# Patient Record
Sex: Male | Born: 1935 | Race: White | Hispanic: No | Marital: Married | Smoking: Former smoker
Health system: Southern US, Community
[De-identification: ages and names within clinical notes are randomized; demographics above are authoritative.]

## PROBLEM LIST (undated history)

## (undated) DIAGNOSIS — F329 Major depressive disorder, single episode, unspecified: Secondary | ICD-10-CM

## (undated) DIAGNOSIS — R351 Nocturia: Secondary | ICD-10-CM

## (undated) DIAGNOSIS — R413 Other amnesia: Secondary | ICD-10-CM

## (undated) DIAGNOSIS — C61 Malignant neoplasm of prostate: Secondary | ICD-10-CM

## (undated) DIAGNOSIS — F419 Anxiety disorder, unspecified: Secondary | ICD-10-CM

## (undated) DIAGNOSIS — R42 Dizziness and giddiness: Secondary | ICD-10-CM

## (undated) DIAGNOSIS — E785 Hyperlipidemia, unspecified: Secondary | ICD-10-CM

## (undated) DIAGNOSIS — R350 Frequency of micturition: Secondary | ICD-10-CM

## (undated) DIAGNOSIS — J984 Other disorders of lung: Secondary | ICD-10-CM

## (undated) DIAGNOSIS — F0150 Vascular dementia without behavioral disturbance: Secondary | ICD-10-CM

## (undated) DIAGNOSIS — R159 Full incontinence of feces: Secondary | ICD-10-CM

## (undated) DIAGNOSIS — Z8701 Personal history of pneumonia (recurrent): Secondary | ICD-10-CM

## (undated) DIAGNOSIS — H409 Unspecified glaucoma: Secondary | ICD-10-CM

## (undated) DIAGNOSIS — E079 Disorder of thyroid, unspecified: Secondary | ICD-10-CM

## (undated) DIAGNOSIS — M1990 Unspecified osteoarthritis, unspecified site: Secondary | ICD-10-CM

## (undated) DIAGNOSIS — S129XXA Fracture of neck, unspecified, initial encounter: Secondary | ICD-10-CM

## (undated) DIAGNOSIS — R32 Unspecified urinary incontinence: Secondary | ICD-10-CM

## (undated) DIAGNOSIS — H9190 Unspecified hearing loss, unspecified ear: Secondary | ICD-10-CM

## (undated) DIAGNOSIS — F32A Depression, unspecified: Secondary | ICD-10-CM

## (undated) DIAGNOSIS — R519 Headache, unspecified: Secondary | ICD-10-CM

## (undated) DIAGNOSIS — G629 Polyneuropathy, unspecified: Secondary | ICD-10-CM

## (undated) DIAGNOSIS — G43909 Migraine, unspecified, not intractable, without status migrainosus: Secondary | ICD-10-CM

## (undated) DIAGNOSIS — Z973 Presence of spectacles and contact lenses: Secondary | ICD-10-CM

## (undated) DIAGNOSIS — G4730 Sleep apnea, unspecified: Secondary | ICD-10-CM

## (undated) DIAGNOSIS — K219 Gastro-esophageal reflux disease without esophagitis: Secondary | ICD-10-CM

## (undated) DIAGNOSIS — I10 Essential (primary) hypertension: Secondary | ICD-10-CM

## (undated) DIAGNOSIS — I2510 Atherosclerotic heart disease of native coronary artery without angina pectoris: Secondary | ICD-10-CM

## (undated) DIAGNOSIS — R0600 Dyspnea, unspecified: Secondary | ICD-10-CM

## (undated) DIAGNOSIS — N400 Enlarged prostate without lower urinary tract symptoms: Secondary | ICD-10-CM

## (undated) HISTORY — DX: Malignant neoplasm of prostate: C61

## (undated) HISTORY — PX: CARDIAC CATHETERIZATION: SHX172

## (undated) HISTORY — PX: HIP ARTHROPLASTY: SHX981

## (undated) HISTORY — DX: Disorder of thyroid, unspecified: E07.9

## (undated) HISTORY — PX: HAND SURGERY: SHX662

## (undated) HISTORY — PX: CIRCUMCISION: SUR203

## (undated) HISTORY — PX: KNEE CARTILAGE SURGERY: SHX688

## (undated) HISTORY — DX: Vascular dementia, unspecified severity, without behavioral disturbance, psychotic disturbance, mood disturbance, and anxiety: F01.50

## (undated) HISTORY — PX: ESOPHAGOGASTRODUODENOSCOPY: SHX1529

## (undated) HISTORY — DX: Migraine, unspecified, not intractable, without status migrainosus: G43.909

## (undated) HISTORY — DX: Dizziness and giddiness: R42

## (undated) HISTORY — PX: LUMBAR LAMINECTOMY: SHX95

## (undated) HISTORY — DX: Full incontinence of feces: R15.9

## (undated) HISTORY — PX: SHOULDER SURGERY: SHX246

## (undated) HISTORY — PX: EYE SURGERY: SHX253

## (undated) HISTORY — PX: CORONARY ANGIOPLASTY: SHX604

## (undated) HISTORY — DX: Other disorders of lung: J98.4

## (undated) HISTORY — PX: COLONOSCOPY: SHX174

## (undated) HISTORY — PX: RETINAL DETACHMENT SURGERY: SHX105

---

## 2002-01-20 ENCOUNTER — Encounter: Admission: RE | Admit: 2002-01-20 | Discharge: 2002-01-20 | Payer: Self-pay | Admitting: Internal Medicine

## 2002-01-20 ENCOUNTER — Encounter: Payer: Self-pay | Admitting: Internal Medicine

## 2002-09-12 ENCOUNTER — Encounter: Payer: Self-pay | Admitting: Internal Medicine

## 2002-09-12 ENCOUNTER — Encounter: Admission: RE | Admit: 2002-09-12 | Discharge: 2002-09-12 | Payer: Self-pay | Admitting: Internal Medicine

## 2003-06-14 ENCOUNTER — Encounter: Admission: RE | Admit: 2003-06-14 | Discharge: 2003-06-14 | Payer: Self-pay | Admitting: Orthopedic Surgery

## 2003-09-06 ENCOUNTER — Encounter: Admission: RE | Admit: 2003-09-06 | Discharge: 2003-09-06 | Payer: Self-pay | Admitting: Sports Medicine

## 2003-09-18 ENCOUNTER — Inpatient Hospital Stay (HOSPITAL_COMMUNITY): Admission: RE | Admit: 2003-09-18 | Discharge: 2003-09-21 | Payer: Self-pay | Admitting: Orthopedic Surgery

## 2003-09-21 ENCOUNTER — Inpatient Hospital Stay (HOSPITAL_COMMUNITY)
Admission: RE | Admit: 2003-09-21 | Discharge: 2003-09-29 | Payer: Self-pay | Admitting: Physical Medicine & Rehabilitation

## 2004-06-30 HISTORY — PX: HIP ARTHROPLASTY: SHX981

## 2005-01-20 ENCOUNTER — Ambulatory Visit: Payer: Self-pay | Admitting: Physical Medicine & Rehabilitation

## 2005-01-20 ENCOUNTER — Inpatient Hospital Stay (HOSPITAL_COMMUNITY): Admission: RE | Admit: 2005-01-20 | Discharge: 2005-01-23 | Payer: Self-pay | Admitting: Orthopedic Surgery

## 2005-01-23 ENCOUNTER — Inpatient Hospital Stay
Admission: RE | Admit: 2005-01-23 | Discharge: 2005-01-29 | Payer: Self-pay | Admitting: Physical Medicine & Rehabilitation

## 2005-04-14 ENCOUNTER — Ambulatory Visit: Payer: Self-pay | Admitting: Physical Medicine & Rehabilitation

## 2005-04-14 ENCOUNTER — Inpatient Hospital Stay (HOSPITAL_COMMUNITY): Admission: RE | Admit: 2005-04-14 | Discharge: 2005-04-18 | Payer: Self-pay | Admitting: Orthopedic Surgery

## 2005-04-15 ENCOUNTER — Ambulatory Visit: Payer: Self-pay | Admitting: Internal Medicine

## 2005-04-18 ENCOUNTER — Inpatient Hospital Stay
Admission: RE | Admit: 2005-04-18 | Discharge: 2005-04-24 | Payer: Self-pay | Admitting: Physical Medicine & Rehabilitation

## 2007-11-09 ENCOUNTER — Encounter: Admission: RE | Admit: 2007-11-09 | Discharge: 2007-11-09 | Payer: Self-pay | Admitting: Orthopedic Surgery

## 2009-04-13 ENCOUNTER — Inpatient Hospital Stay (HOSPITAL_COMMUNITY): Admission: RE | Admit: 2009-04-13 | Discharge: 2009-04-17 | Payer: Self-pay | Admitting: Orthopedic Surgery

## 2010-07-21 ENCOUNTER — Encounter: Payer: Self-pay | Admitting: Orthopedic Surgery

## 2010-10-03 LAB — CBC
HCT: 30.8 % — ABNORMAL LOW (ref 39.0–52.0)
HCT: 31.3 % — ABNORMAL LOW (ref 39.0–52.0)
HCT: 32.9 % — ABNORMAL LOW (ref 39.0–52.0)
Hemoglobin: 10.5 g/dL — ABNORMAL LOW (ref 13.0–17.0)
Hemoglobin: 10.7 g/dL — ABNORMAL LOW (ref 13.0–17.0)
Hemoglobin: 11.1 g/dL — ABNORMAL LOW (ref 13.0–17.0)
Hemoglobin: 14.4 g/dL (ref 13.0–17.0)
MCHC: 33.9 g/dL (ref 30.0–36.0)
MCHC: 34.1 g/dL (ref 30.0–36.0)
MCHC: 34.3 g/dL (ref 30.0–36.0)
MCHC: 34.2 g/dL (ref 30.0–36.0)
MCV: 89.1 fL (ref 78.0–100.0)
MCV: 89.6 fL (ref 78.0–100.0)
MCV: 90 fL (ref 78.0–100.0)
Platelets: 121 10*3/uL — ABNORMAL LOW (ref 150–400)
Platelets: 127 10*3/uL — ABNORMAL LOW (ref 150–400)
Platelets: 132 10*3/uL — ABNORMAL LOW (ref 150–400)
RBC: 3.45 MIL/uL — ABNORMAL LOW (ref 4.22–5.81)
RBC: 3.49 MIL/uL — ABNORMAL LOW (ref 4.22–5.81)
RBC: 3.66 MIL/uL — ABNORMAL LOW (ref 4.22–5.81)
RBC: 4.71 MIL/uL (ref 4.22–5.81)
RDW: 13 % (ref 11.5–15.5)
RDW: 13.3 % (ref 11.5–15.5)
RDW: 13.6 % (ref 11.5–15.5)
WBC: 7.1 10*3/uL (ref 4.0–10.5)
WBC: 7.4 10*3/uL (ref 4.0–10.5)
WBC: 7.9 10*3/uL (ref 4.0–10.5)

## 2010-10-03 LAB — PROTIME-INR
INR: 1.18 (ref 0.00–1.49)
INR: 1.79 — ABNORMAL HIGH (ref 0.00–1.49)
INR: 2.17 — ABNORMAL HIGH (ref 0.00–1.49)
INR: 2.54 — ABNORMAL HIGH (ref 0.00–1.49)
Prothrombin Time: 14.9 seconds (ref 11.6–15.2)
Prothrombin Time: 20.6 seconds — ABNORMAL HIGH (ref 11.6–15.2)
Prothrombin Time: 24 seconds — ABNORMAL HIGH (ref 11.6–15.2)
Prothrombin Time: 27.1 seconds — ABNORMAL HIGH (ref 11.6–15.2)
Prothrombin Time: 13.2 seconds (ref 11.6–15.2)

## 2010-10-03 LAB — URINALYSIS, ROUTINE W REFLEX MICROSCOPIC
Hgb urine dipstick: NEGATIVE
Nitrite: NEGATIVE
Protein, ur: NEGATIVE mg/dL
Urobilinogen, UA: 0.2 mg/dL (ref 0.0–1.0)

## 2010-10-03 LAB — BASIC METABOLIC PANEL
BUN: 12 mg/dL (ref 6–23)
BUN: 16 mg/dL (ref 6–23)
CO2: 28 mEq/L (ref 19–32)
CO2: 28 mEq/L (ref 19–32)
Calcium: 8.8 mg/dL (ref 8.4–10.5)
Calcium: 9.1 mg/dL (ref 8.4–10.5)
Chloride: 104 mEq/L (ref 96–112)
Chloride: 105 mEq/L (ref 96–112)
Creatinine, Ser: 1.16 mg/dL (ref 0.4–1.5)
Creatinine, Ser: 1.29 mg/dL (ref 0.4–1.5)
GFR calc Af Amer: >60 mL/min (ref >60)
GFR calc Af Amer: >60 mL/min (ref >60)
GFR calc non Af Amer: 55 mL/min — ABNORMAL LOW (ref >60)
GFR calc non Af Amer: >60 mL/min (ref >60)
Glucose, Bld: 113 mg/dL — ABNORMAL HIGH (ref 70–99)
Glucose, Bld: 118 mg/dL — ABNORMAL HIGH (ref 70–99)
Potassium: 3.3 mEq/L — ABNORMAL LOW (ref 3.5–5.1)
Potassium: 3.3 mEq/L — ABNORMAL LOW (ref 3.5–5.1)
Sodium: 138 mEq/L (ref 135–145)
Sodium: 138 mEq/L (ref 135–145)

## 2010-10-03 LAB — COMPREHENSIVE METABOLIC PANEL
ALT: 18 U/L (ref 0–53)
Alkaline Phosphatase: 51 U/L (ref 39–117)
CO2: 25 mEq/L (ref 19–32)
Calcium: 10.4 mg/dL (ref 8.4–10.5)
GFR calc non Af Amer: 53 mL/min — ABNORMAL LOW (ref >60)
Glucose, Bld: 98 mg/dL (ref 70–99)
Potassium: 3.4 mEq/L — ABNORMAL LOW (ref 3.5–5.1)
Sodium: 142 mEq/L (ref 135–145)

## 2010-10-03 LAB — URINE MICROSCOPIC-ADD ON

## 2010-11-15 NOTE — Discharge Summary (Signed)
Jeffery [name], Jeffery [name]               ACCOUNT NO.:  [id number]   MEDICAL RECORD NO.:  [id number]          PATIENT TYPE:  INP   LOCATION:  1518                         FACILITY:  [facility]   PHYSICIAN:  [name], M.D.    DATE OF BIRTH:  [birth year]   DATE OF ADMISSION:  01/20/2005  DATE OF DISCHARGE:  01/23/2005                                 DISCHARGE SUMMARY   ADMISSION DIAGNOSES:  1.  Osteoarthritis, left knee.  2.  Recent history of hematuria, previous workup by urologist.  3.  Depression.  4.  Hyperlipidemia.  5.  Hypertension.  6.  Mitral valve prolapse.  7.  Benign prostatic hypertrophy.  8.  Renal cystic disease.   DISCHARGE DIAGNOSES:  1.  Osteoarthritis, left knee, status post left total knee arthroplasty.  2.  Postoperative hypokalemia, improved.  3.  Recent history of hematuria, previous workup by urologist.  4.  Depression.  5.  Hyperlipidemia.  6.  Hypertension.  7.  Mitral valve prolapse.  8.  Benign prostatic hypertrophy.  9.  Renal cystic disease.   PROCEDURE:  On January 20, 2005, left total knee.   SURGEON:  [name], M.D.   ASSISTANT:  Alexzandrew L. Perkins, P.A.-C.   ANESTHESIA:  General with postoperative Marcaine pain pump.   TOURNIQUET TIME:  Forty-seven minutes at 300 mmHg.   CONSULTS:  Rehab services.   BRIEF HISTORY:  Mr. Spiker is a 75 year old male with severe end-stage  arthritis to both knees, left more symptomatic than right, for  intraoperative management.  Now presents for total knee arthroplasty.   LABORATORY DATA:  CBC preop with hemoglobin 14 and hematocrit 40.1.  White  cell count 5.3.  Differential within normal limits.  Postop hemoglobin 11.6.  Last noted H&H 11.3 and 32.6.  PT/PTT preop 13.7 and 34, respectively.  INR  1.  Serial pro times were followed.  Last noted PT/INR 20.7 and 1.8.  Chem  panel on admission:  The potassium was on the lower side of 3.3.  The  remaining chem panel all within normal limits.  Serial BMETs  are followed:  Potassium did not drop down to 3.2.  Back up to 3.6.  The remaining  electrolytes remained within normal limits.  Urinalysis preop negative.  Blood group type A+.   Chest x-ray report sent over from [facility] dated December 26, 2004.  No definite acute process.   He had an EKG sent over dated December 25, 2004:  Normal sinus rhythm.  Confirmed.  Unable to read signature.   HOSPITAL COURSE:  Patient was admitted to [facility] , taken to  the OR, and underwent the above procedure without complication.  Patient  tolerated the procedure well.  Placed on PCM and  p.o. analgesics.  Patient  underwent a rehab consult following surgery, felt to be appropriate for  inpatient rehab versus SACU.  On day #1, the patient was doing fairly well.  Did have some pain.  Had a rough morning.  Started getting up.  Hemovac  drain was pulled on day #1.  Started therapy.  By day #2, doing a little bit better.  Still with some pain.  Patient had  been seen by rehab services.  Felt to be a good candidate.  Weaned of her  p.m. meds.  DC'd PCA and fluids.  Dressing was changed.  Incision was  healing well.  Pain pump removed.  Started getting up with therapy and  ambulating approximately 20 feet and then 35 feet later that day.   By day #3, doing a little bit better.  Continued to improve with pain  control.  Was ambulating up approximately 45 feet.  Slowly progressing.  Felt to be an excellent candidate for rehab potential.  It was noted that a  bed became available later that day.  Patient was transferred at that time.   DISCHARGE PLAN:  1.  Patient was transferred to [facility] rehab [facility] on July 27 , 2006.  2.  Discharge diagnoses:  Please see above.  3.  Discharge meds:  Continue current medications as per the [facility], which will      be sent over with the patient.  4.  Diet:  As tolerated.  Low cholesterol.  Low sodium.  5.  Follow up two weeks from surgery following  discharge from the rehab      unit.  6.  Activity:  Weightbearing as tolerated to the left lower extremity.      Continue gait training, ambulation, and ADLs as per PT/OT for total knee      protocol.  May start showering.  Daily dressing changes.   DISPOSITION:  [place] [facility].   CONDITION ON DISCHARGE:  Improved.      [name], P.A.      [name], M.D.  Electronically Signed    ALP/MEDQ  D:  03/17/2005  T:  03/17/2005  Job:  [id number]   cc:   [facility]

## 2010-11-15 NOTE — Op Note (Signed)
NAMEJONDAVID, Jeffery [name]               ACCOUNT NO.:  [id number]   MEDICAL RECORD NO.:  [id number]          PATIENT TYPE:  INP   LOCATION:  0002                         FACILITY:  [facility]   PHYSICIAN:  [name], M.D.    DATE OF BIRTH:  09/11/1935   DATE OF PROCEDURE:  04/14/2005  DATE OF DISCHARGE:                                 OPERATIVE REPORT   PREOPERATIVE DIAGNOSIS:  Osteoarthritis right knee.   POSTOPERATIVE DIAGNOSIS:  Osteoarthritis right knee.   PROCEDURE:  Right total knee arthroplasty.   SURGEON:  [name], M.D.   ASSISTANT:  Avel Peace, P.A.-C.   ANESTHESIA:  General with postop Marcaine pain pump.   ESTIMATED BLOOD LOSS:  Minimal.   DRAINS:  Hemovac x1.   TOURNIQUET TIME:  49 minutes at 300 mmHg.   COMPLICATIONS:  None.   CONDITION:  Stable to recovery.   BRIEF CLINICAL NOTE:  [name] is a 75 year old male who has end-stage  arthritis of the right knee with intractable pain. He recently had a  successful left total knee arthroplasty and presents now for right total  knee arthroplasty.   PROCEDURE IN DETAIL:  After the successful administration of general  anesthetic, a tourniquet placed high on his right thigh and right lower  extremity prepped and draped in the usual sterile fashion. Extremities  wrapped in Esmarch, knee flexed, tourniquet inflated to 300 mmHg. A standard  midline incision was made with a 10 blade through the subcutaneous tissue to  the level of the extensor mechanism. A fresh blade was used to make a medial  parapatellar arthrotomy. The soft tissue over the proximal medial tibia is  subperiosteally elevated to the joint line with a knife and into the  semimembranosus bursa with a Cobb elevator. The soft tissue over the  proximal lateral tibia also elevated with attention being paid to avoiding  the patellar tendon on tibial tubercle. The patella was everted, knee flexed  90 degrees and ACL and PCL removed. A drill was used to  create a starting  hole in the distal femur canal was irrigated. A 5 degree right valgus  alignment guide was placed and referencing off the posterior condyles  rotations marked and the block pinned to remove 11 mm off the distal femur.  Distal femoral resection was made with an oscillating saw. A sizing block  was placed and a size 6 is the most appropriate. Rotation is marked off the  epicondylar axis.   The tibia is subluxed forward and the menisci removed. Extramedullary tibial  alignment guide is placed referencing proximally at the medial aspect of the  tibial tubercle and distally along the second metatarsal axis and tibial  crest. The block is pinned to remove 10 mm off the nondeficient lateral  side. Tibial resection is made with an oscillating saw. The 10 mm spacer  block is then placed, knee held in full extension, excellent balance was  achieved. We then went back to the femur. The size 6 cutting block is placed  and then the anterior, posterior, and chamfer cuts were made. The size 6  block  is then used to make an intercondylar cut. The trial size 6 is placed  with excellent fit. The tibia is again subluxed forward and then the  proximal tibia is prepared with the modular drill and keel punch for a size  5 which is the best size. A 10 mm posterior stabilized rotating platform  insert trial is placed and then full extension is achieved with excellent  varus and valgus balance throughout full range of motion. The patella was  everted and thickness measured the 26 mm. Freehand resection is taken down  to 14 mm, 41 template is placed, lug holes are drilled, trial patella is  placed and it tracks normally. The osteophytes are then removed off the  posterior femur and all trials are subsequently removed. The cut bone  surfaces are prepared with pulsatile lavage and the cement mixed. Once ready  for implantation, the size 5 mobile bearing tibial tray, size 6 posterior  stabilized  femur and 41 patella are cemented into place and patella is held  with a clamp. A trial 10 mm insert is placed, knee held in full extension,  all extruded cement removed. Once the cement is fully hardened then the  permanent 10 mm posterior stabilized rotating platform insert is placed into  the tibial tray. The wound is copiously irrigated with saline solution and  the extensor mechanism closed over a hemovac drain with interrupted #1 PDS.  Flexion against gravity is 135 degrees. Tourniquets is released with a total  time of 49 minutes. The subcu is closed with interrupted 2-0 Vicryl,  subcuticular with running 4-0 Monocryl. The incision is cleaned and dried  and Steri-Strips and a bulky sterile dressing applied. He was then awakened  and transported to recovery in stable condition.      [name], M.D.  Electronically Signed     FA/MEDQ  D:  04/14/2005  T:  04/14/2005  Job:  [id number]

## 2010-11-15 NOTE — Consult Note (Signed)
Jeffery [name], Jeffery [name]               ACCOUNT NO.:  [id number]   MEDICAL RECORD NO.:  [id number]          PATIENT TYPE:  INP   LOCATION:  1504                         FACILITY:  [facility]   PHYSICIAN:  Arvilla Meres, M.D. LHCDATE OF BIRTH:  Jun 12, 1936   DATE OF CONSULTATION:  04/16/2005  DATE OF DISCHARGE:                                   CONSULTATION   PRIMARY CARE PHYSICIAN:  Dr. Derrell Lolling in [facility].   CHIEF COMPLAINT/REASON FOR CONSULT:  Chest pain status post knee  replacement.   HISTORY OF PRESENT ILLNESS:  Jeffery [name] is a delightful 75 year old man with  a history of hypertension and hyperlipidemia but no known coronary artery  disease, who was admitted for a right knee replacement on Monday.  He  underwent knee replacement without complications.  This morning he went to  get out of bed and was using his upper body to stabilize his weight and  noticed some chest pain on the lateral aspect of his chest.  There are no  associated features with this.  Once he sat down the chest pain subsided,  although his chest was still sore on palpation.  In talking with him he says  he has a history of somewhat chronic chest pain, especially as a young man.  He said he would feel pressure in the central part of his chest anytime he  had severe emotional stress.  He has not had any of these symptoms over the  last few years.  Although he is fairly active, he has not able to exercise  regularly due to his severe osteoarthritis and status post previous right  hip replacement, as well as left knee replacement in July 2006.  Prior to  his knee replacement in July he underwent an Adenosine Cardiolite, which  showed an EF of 48% with no evidence of ischemia.  Of note, he also  previously carried a history of mitral valve prolapse but when he moved to  [place] from [place], he underwent a TEE to further evaluate and  this showed an EF of 65% with mild mitral regurgitation but no evidence of  prolapse.   Currently he is lying in bed.  He is complaining of significant pain in his  right knee but denies any chest pain or shortness of breath.   PAST MEDICAL HISTORY:  1.  Hypertension.  2.  Hyperlipidemia.  3.  Previous history of chest pain presumed non-cardiac.      1.  Adenosine Cardiolite in July 2006 with an EF of 48% but no ischemia.  4.  Questionable history of mitral valve prolapse.      1.  TEE in July 2006 in [facility] showed an EF of 65% with mild mitral          regurgitation but no prolapse.  5.  Obesity.  6.  Gastroesophageal reflux disease.  7.  Severe osteoarthritis status post previous right hip and left knee      replacement.  Now two days postoperative from a right knee replacement.  8.  Kidney stones.  9.  Depression.  CURRENT MEDICATIONS:  1.  Potassium.  2.  Coumadin, which is new.  3.  Lisinopril 40.  4.  Plendil 10.  5.  Hydrochlorothiazide 25.  6.  Protonix 40.  7.  Hytrin 4.  8.  Avodart 0.5.  9.  Lipitor 40.   MEDICATION ALLERGIES:  NO KNOWN DRUG ALLERGIES.   REVIEW OF SYSTEMS:  He endorses some sweats as well as headaches, some mild  dyspnea on exertion, and a cough.  He also has urinary frequency and urgency  with a poor stream.  Also positive for arthralgias, joint swelling and pain,  and reflux system.  Otherwise all systems are negative except as per HPI and  problem list.   SOCIAL HISTORY:  He currently lives in [place] with his wife.  He is  retired and previously worked at [employer].  He has two children.  He previously smoked a pipe but not for very long.  He denies any alcohol  abuse.   FAMILY HISTORY:  His mother died at 41 from coronary disease.  His father  died at 25 from a brain tumor with possible coronary disease.  He does have  hypertension and cholesterol in his siblings but no premature coronary  artery disease.   PHYSICAL EXAMINATION:  GENERAL:  He is lying flat in bed in no acute  distress.  His  respirations are unlabored.  VITAL SIGNS:  Temperature 99.2, blood pressure is 120/68 with a pulse of 77.  He is saturating 91% on 2 L.  HEENT:  Sclerae anicteric.  EOMI.  There is no xanthelasma.  Mucous  membranes are moist.  NECK:  Supple.  There is no JVD.  Carotids are 2+ bilaterally without any  bruits.  There is no lymphadenopathy or thyromegaly.  CARDIAC:  He has a regular rate and rhythm with no murmurs, rubs, or  gallops.  LUNGS:  Clear to auscultation.  ABDOMEN:  Obese, soft, nontender, and nondistended.  There is no  hepatosplenomegaly, no bruits, no masses.  EXTREMITIES:  Warm.  He has a cast on the right lower extremity.  Otherwise  no cyanosis, clubbing, or edema.  Femoral pulses are 2+ bilaterally.  Distal  pulses are 2+ bilaterally.  CHEST:  Of note, there is mild reproducible chest on palpation of his  lateral left chest wall.  NEUROLOGIC:  He is alert and oriented x3.  Cranial nerves II-XII are intact.  He moves his upper arms and left leg without any difficulty.  He can move  his right leg but is limited by pain from his surgery.  His affect is very  bright.   LABORATORY DATA:  EKG shows a normal sinus rhythm at a rate of 83.  There is  borderline voltage for LVH.  There is no ST-T wave changes.   ASSESSMENT/PLAN:  Despite his multiple risk factors, Jeffery [name] chest pain  is quite atypical and certainly appears to be musculoskeletal.  I am  reassured by the fact of his negative Adenosine Cardiolite in July 2006,  although it must be noted that his ejection fraction was just on the low end  of normal at that time.  However, it was normal by transesophageal  echocardiogram.   At this time I would transfer him to telemetry and rule out myocardial  infarction with serial cardiac markers just for safety's sake.  I would also  treat him with baby aspirin.  If he rules out with three sets of negative cardiac markers, no further cardiac  workup would be needed.   We  appreciate the consult and will follow with you.      Arvilla Meres, M.D. [facility]  Electronically Signed     DB/MEDQ  D:  04/16/2005  T:  04/16/2005  Job:  [id number]   cc:   Derrell Lolling, M.D.  [place]  [place]

## 2010-11-15 NOTE — Discharge Summary (Signed)
NAMEOTT, ZIMMERLE                           ACCOUNT NO.:  [id number]   MEDICAL RECORD NO.:  [id number]                   PATIENT TYPE:  INP   LOCATION:  0478                                 FACILITY:  [facility]   PHYSICIAN:  [name], M.D.                 DATE OF BIRTH:  [birth year]   DATE OF ADMISSION:  09/18/2003  DATE OF DISCHARGE:  09/21/2003                                 DISCHARGE SUMMARY   ADMITTING DIAGNOSES:  1. Osteoarthritis of right hip.  2. Bilateral knees osteoarthritis.  3. Hypertension.  4. Hiatal hernia.  5. Reflux disease.  6. Benign prostatic hypertrophy.  7. Mitral valve prolapse.  8. Difficulty hearing, with bilateral hearing aids.  9. History of inverted T waves per EKG.   DISCHARGE DIAGNOSES:  1. Osteoarthritis of right hip, status post right total hip arthroplasty.  2. Postoperative hypokalemia, improved.  3. Bilateral knees osteoarthritis.  4. Hypertension.  5. Hiatal hernia.  6. Reflux disease.  7. Benign prostatic hypertrophy.  8  Mitral valve prolapse.  1. Difficulty hearing, with bilateral hearing aids.  2. History of inverted T waves per EKG.  3. Left eye visual acuity changes postoperative.   CONSULTS:  Rehabilitation Services.   PROCEDURE:  Date of surgery September 18, 2003 - right total hip arthroplasty.  Surgeon - Dr. Homero Fellers Aluisio.  Assistant - Avel Peace, P.A.C.  Anesthesia -  general.  Blood loss 600 cc.  Hemovac drain x1.   BRIEF HISTORY:  Mr. Jeffery [name] is a 75 year old male with severe end-stage  arthritis of the right hip.  The pain has been refractory for non-operative  management, and he now presents for a total hip arthroplasty.   LABORATORY DATA:  CBC on admission revealed hemoglobin of 14.9, hematocrit  43.5, white cell count of 5.0, red cells count 5.0, red cell count 5.09.  Differential within normal limits.  Postoperative hemoglobin and hematocrit  of 11.8 and 36.6.  The last noted hemoglobin and hematocrit of 12.0 and  34.7.  PT and PTT preoperatively 12.3 and 31 respectively, and INR of 0.9.  Serial pro times were followed per Coumadin protocol.  The last-noted PT and  INR were 18.1 and 1.8.  Chem panel on admission revealed a potassium of 3.0.  The remaining chemistry panel were all within normal limits.  Serial BMET's  were followed.  Potassium did come back up, last noted at 3.4.  The  remaining electrolytes remained within normal limits.  Urinalysis on  admission was negative.  Blood group type A positive.   EKG dated September 12, 2003 revealed normal sinus rhythm, nonspecific ST-T  changes.  No previous tracing.  Confirmed by Dr. Viann Fish, [place] Hageman.  A 2-  view chest taken on September 12, 2003 revealed mild cardiomegaly.  Patchy  bibasilar areas of subsegmental atelectasis versus scarring, or less likely  developing __________ interstitial infiltrates.  Right hip films on September 12, 2003 revealed degenerative changes in both hips, right greater than  left.  Postoperative hip films and pelvis films on September 18, 2003 revealed  normal alignment of right hip arthroplasty.   HOSPITAL COURSE:  The patient was admitted to [facility], taken to  the OR, underwent the above-stated procedure without complication.  The  patient tolerated the procedure well, later transferred to the recovery  room, and then to the orthopedic floor to continue postoperative care.  Vital signs were followed.  The patient was given 24 hours of postoperative  IV antibiotics in the form of Ancef, Coumadin for 3 weeks, and started back  on all home medications.  PT and OT were consulted postoperatively, along  with a rehabilitation consult.  Partial weightbearing of 50% to the right  lower extremity.  Hemovac drain placed pulled on postoperative day #1.  The  patient had a fairly decent night on the evening of surgery, was doing  fairly good, fluids were reduced.  The patient was seen by [facility],  and was felt to be an  appropriate candidate for inpatient rehabilitation.  Therefore, it was decided the patient could be transferred, at which time a  bed became available, and the patient was stable.  By day #2, the patient  had developed some nausea on the night before, anti-emetics as needed.  The  biggest complaint was headache.  He had tried Fioricet in the past.  Therefore, this was ordered.  He also had some irrigation in his left eye.  We tried from Visine drops due to the fact that I felt it may be irritated  from the lubricating gel that may have been used at the time of surgery.  By  day #3, the patient was having still some complaints with his left eye.  It  continued to be a problem.  The headache was a little better, utilizing the  Fioricet.  His left shoulder was a little sore from also being positioned on  the table, laying on the left side.  The biggest complaint was his left eye  though.  He had had retinal surgery on the right eye twice with bilateral  cataract surgery and bilateral lens implants.  He was quite concerned with a  previous surgery on the left eye, and a lens implant.  We put a call into  his ophthalmologist, Dr. Laruth Bouchard office, to see if the patient could be  evaluated.  It was also noted about that time that a bed actually became  available on the rehabilitation unit from a therapy standpoint.  The patient  was slowly progressing with physical therapy and felt to be an appropriate  candidate.  His hemoglobin was stable.  It was felt he would be able to go  over to rehabilitation at this time.  We contacted Dr. Laruth Bouchard office.  I  spoke with one of the ladies in his office and explained the situation to  her - him being an inpatient and being transported over to [facility].  Dr. Laruth Bouchard office was located right near [facility], and they were  asking if he could come by for a quick examination.  They said they would work him in just as soon as he rolled through the door.   We spoke to  discharge planning, and Reece Levy spoke with the EMS ambulance  services, and made arrangements for the patient to be transported over there  for a quick eye exam prior to going over to  [facility].  We wanted to  make sure that the lens implants were doing okay, and to see if he needed  any type of treatment or therapy with his eye prior to going into  rehabilitation.  Arrangements were made.  I am very appreciative of Wendee Copp work with this.  Once arrangements were made, the patient was  transferred over to [facility].   DISCHARGE PLAN:  1. The patient transferred over to [facility] rehabilitation.  2. Discharge diagnosis - please see above.   DISCHARGE MEDICATIONS:  1. The patient will continue with his pain medications of Percocet.  2. Robaxin for spasm.  3. Coumadin per pharmacy protocol.  4. He is to continue with his home medications.   DIET:  Low-sodium diet.   ACTIVITY:  Fifty percent partial weightbearing, right lower extremity.  Continue with gait training, ambulation, activities of daily living as per  [facility].   FOLLOW UP:  Follow up 2 weeks from surgery (call the office for an  appointment) or following discharge from the rehabilitation unit.   DISPOSITION:  [facility].   CONDITION ON DISCHARGE:  Improved.     [name], P.A.              [name], M.D.    ALP/MEDQ  D:  10/20/2003  T:  10/21/2003  Job:  [id number]

## 2010-11-15 NOTE — Discharge Summary (Signed)
NAMEROSALIO, CATTERTON                           ACCOUNT NO.:  [id number]   MEDICAL RECORD NO.:  [id number]                   PATIENT TYPE:  IPS   LOCATION:  4140                                 FACILITY:  [facility]   PHYSICIAN:  Ranelle Oyster, M.D.             DATE OF BIRTH:  [birth year]   DATE OF ADMISSION:  09/21/2003  DATE OF DISCHARGE:  09/29/2003                                 DISCHARGE SUMMARY   DISCHARGE DIAGNOSES:  1. Right total hip replacement.  2. History of elevated cholesterol.  3. History of benign prostatic hypertrophy.  4. History of neck pain secondary to trauma several years ago.   HISTORY OF PRESENT ILLNESS:  The patient is a 75 year old white male with  past history of hypertension, BPH, MVP and severe OA of the right hip, who  has failed conservative management and elected to undergo a right total hip  replacement on September 18, 2003 by Dr. [name].  Coumadin for DVT  prophylaxis.  PT report at this time indicates patient is partial  weightbearing of 50%, ambulating min-assist at 8 feet with rolling walker,  could transfer, min-assist, bed mobility, min-assist.  Hospital course  significant for left shoulder pain and left eye pain.  Patient went to  ophthalmologist prior to transferring to rehab; the patient stated that the  patient had fluid behind his left eye and he was treated with laser.  The  patient then transferred to [facility] Rehab Department on September 21, 2003.   PAST MEDICAL HISTORY:  1. BPH.  2. MVP.  3. Bilateral __________.  4. GERD, as above.  5. OA.  6. Head injury with right detached retina.  7. Sleep apnea.  8. Elevated cholesterol.   PAST SURGICAL HISTORY:  Past surgical history significant for:  1. Detached retina surgery x2.  2. Bilateral cataract surgery and lens implants.  3. Bilateral knee surgery.   PRIMARY CARE Zyria Fiscus:  Dr. Demetrios Isaacs. [place].   OPHTHALMOLOGIST:  Dr. Doris Cheadle. [place].   SOCIAL HISTORY:   Patient lives with wife in 1-level home.  Wife works.  Independent prior to admission.  Denies history of tobacco.  Occasional  alcohol.  He is retired.  Has 2 children local and 3 in [place].  He  retired from [employer].   FAMILY HISTORY:  Noncontributory.   REVIEW OF SYSTEMS:  Review of systems significant for neck pain, joint pain  and joint swelling.   HOSPITAL COURSE:  Mr. Letrell Attwood was admitted to [facility]  Department on September 21, 2003 for comprehensive inpatient rehabilitation  where he received more than 3 hours of therapy daily.  Overall, Mr. Vaneaton  progressed fairly well during his 8-day stay in rehab.  He was discharged at  a modified independent level.  Overall, Mr. Moure right hip healed very  well, demonstrated no signs of infection.  The patient was  followed  occasionally by Dr. [name].  He remained on Coumadin for DVT  prophylaxis without any significant bleeding complications noted.  The  patient remained partial weightbearing throughout his stay in rehab and was  able to maintain his weightbearing status as well as to adhere to the hip  precautions.  The patient maintained problems while in rehab with neck pain.  He received Kinesio tape and OxyContin and K-Pad as needed for neck pain; it  did eventually improve.  At the time of admission, the patient's left eye  was significantly red; this did steadily improved.  The patient managed to  take the drops, Alcon, as well as Travatan daily.  The patient also received  Robaxin 400 mg p.o. 4 times daily as needed for any muscle spasms.   Hospital course was also significant for decreased potassium; the patient  received potassium supplement for several days and once potassium was  normalized, it was discontinued.  The patient's blood pressure was fairly  easily controlled on lisinopril as well as hydrochlorothiazide.  He managed  to take Zocor 40 mg p.o. __________ for history of  hypercholesterolemia.  There were no other major issues that occurred while the patient was in  rehab.   Latest labs indicate his latest sodium is 140 and potassium 3.5, chloride  105, creatinine 1.1, BUN 18, glucose 107, INR 2.7; latest hemoglobin 11.9,  hematocrit 34.3, platelet count 188,000, white blood cell count 7.3.  At the  time of discharge, PT report indicated that the patient was ambulating  greater than 100 feet, modified independently at partial-weightbearing  status, able to transfer sit to stand modified independently, able to  perform most ADLs modified independently.  At the time of discharge, all  blood pressure was stable and surgical intervention demonstrated no signs of  infection.  The patient was transferred home with his wife.   DISCHARGE MEDICATIONS:  1. Coumadin 4 mg in p.m. until October 19, 2003.  2. Plendil 10 mg daily.  3. Hytrin 2 mg daily.  4. Hydrochlorothiazide 25 mg daily.  5. Lisinopril 40 mg daily.  6. Zocor 40 mg daily.  7. Travatan 0.04% 1 drop at night.  8. Alcon 1 drop 4 times a day.  9. Robaxin 1 tab every 6-8 hours as needed for spasms.  10.      Celebrex 200 mg daily.  11.      OxyContin in form of taper.  12.      Prilosec 20 mg daily.  13.      Oxycodone 5-10 mg every 4-6 hours as needed.   PAIN MANAGEMENT:  OxyContin, oxycodone and Tylenol.   ACTIVITY:  No driving.  No drinking alcohol.  Partial weightbearing 50%.  No  smoking.  He is to use walker, observe hip precautions.  [facility]  Care for PT, OT and a nurse; first draw will be Monday, October 02, 2003.   FOLLOWUP:  Follow up with Dr. Lequita Halt in 2 weeks; call for appointment.  Follow up with Dr. Ranelle Oyster as needed, [phone].  Follow up with  ophthalmologist within 1 week, Dr. Dione Booze.  Follow up with Dr. Barbee Shropshire in 4-  6 weeks.      Drucilla Schmidt, P.A.                         Ranelle Oyster, M.D.   LB/MEDQ  D:  09/29/2003  T:  09/30/2003  Job:  [id number]  cc:   [name], M.D.  [facility]  [address]  [address]  [place]  [place] [address]  Fax: [phone]   Ranelle Oyster, M.D.  [address] [place]  [place] [address]  Fax: [phone]   Olene Craven, M.D.  [address]  [address]  [place]  [place] [address]  Fax: [phone]   Doris Cheadle. Dione Booze, M.D.  [phone] [address]  [place]  [place] [address]  Fax: [phone]

## 2010-11-15 NOTE — Discharge Summary (Signed)
[name], Jeffery [name]               ACCOUNT NO.:  [id number]   MEDICAL RECORD NO.:  [id number]          PATIENT TYPE:  INP   LOCATION:  1401                         FACILITY:  [facility]   PHYSICIAN:  [name], M.D.    DATE OF BIRTH:  01/30/1936   DATE OF ADMISSION:  04/14/2005  DATE OF DISCHARGE:  04/18/2005                                 DISCHARGE SUMMARY   ADMITTING DIAGNOSES:  1.  Osteoarthritis, right knee.  2.  Arthritis.  3.  History of hematuria, recent workup.  4.  Depression.  5.  Hyperlipidemia.  6.  Hypertension.  7.  Mitral valve prolapse.  8.  Benign prostatic hypertrophy.  9.  Renal cystic disease.   DISCHARGE DIAGNOSES:  1.  Osteoarthritis - right knee, status post right total knee arthroplasty.  2.  Postoperative atypical chest pain.  Cardiac event ruled out.  3.  Arthritis.  4.  History of hematuria, recent workup.  5.  Depression.  6.  Hyperlipidemia.  7.  Hypertension.  8.  Mitral valve prolapse.  9.  Benign prostatic hypertrophy.  10. Renal cystic disease.  11. Mild postoperative blood loss anemia.  12. Mild hypokalemia, improved.   PROCEDURE:  April 14, 2005, right total knee.   SURGEON:  [name], M.D.   ASSISTANT:  [name], P.A.   ANESTHESIA:  General.   minimal blood loss.  Tourniquet time 49 minutes.   CONSULTATIONS:  1.  Rehab services.  2.  Cardiology service, Arvilla Meres, M.D. [facility]   BRIEF HISTORY:  Jeffery Jeffery [name] is a 75 year old male with end-stage osteoarthritis  of the right knee, intractable pain, recently successful undergone a left  total knee but now presents for right total knee.   LABORATORY DATA:  Pre-op CBC, hemoglobin 14.5, hematocrit 43.1, differential  within normal limits.  Post-op hemoglobin 11.2, drifted down to 11.1, last  known 10.9 and 31.5 H&H.  PT PTT pre-op 13.6 and 35 respectively.  Serial  pro times followed last known PT INR 22.1 and 1.9.  Chem panel on admission  mild low potassium at  3.4.  Remaining chem panel within normal limits.  Serial B-METs are followed.  B-METs remained within normal limits.  Cardiac  enzymes were taken, on April 16, 2005, three sets:  First set CK 59, CK-MB  0.6, troponin 0.01.  Second set CK 63, CK-MB 0.5, troponin 0.02.  Third set  CK 53, CK-MB 0.5, and troponin 0.01.  Urinalysis, pre-op, trace hemoglobin,  positive protein otherwise negative.  Blood group type A positive.  EKG:  Normal sinus rhythm, minimal voltage criteria for LVH, unconfirmed.  Followup EKG, April 17, 2005, normal sinus rhythm with normal voltage  criteria for LVH, unconfirmed.   HOSPITAL COURSE:  Admitted to [facility], taken to the OR,  tolerated the procedure well, later taken to the recovery room on the  orthopedic floor, started on PCA and p.o. analgesics, had some pain through  the night but was tolerable, did have a little bit of nausea post-op but was  relieved with antiemetics, started to get up with physical therapy, had a  tough night the second night after surgery, unfortunately his mother who  lived in [place] passed away.  Pain wise the patient has been doing a  little bit better.  Rehab consult was called.  The patient was seen in  evaluation by rehab services and felt he was borderline whether he could go  home versus inpatient rehab.  He was monitored.  He had a little bit of low  potassium after surgery and was supplemented, potassium came back up.  Unfortunately, later that day on post-op day two, he started having some  atypical chest pain.  Cardiology was consulted.  Cardiac enzymes were  ordered, series of three did not show any cardiac event.  He was transferred  to telemetry just for monitoring overnight.  He was ruled out with three  sets of negative cardiac enzymes.  By the following day of day three, he was  doing much better.  He was able to come off telemetry.  He started  progressing well with his physical therapy.  He was up  ambulating  approximately 50 feet by April 18, 2005.  He was feeling better, no chest  pain.  It was noted due to his slow progression after the atypical chest  pain, that the patient would benefit from undergoing a SACU stay.  It was  noted later that day, on April 18, 2005, that a bed became available.  He  was transferred at that time.   DISCHARGE PLAN:  The patient was transferred to [facility] on April 18, 2005.   DISCHARGE DIAGNOSES:  Please see above.   DISCHARGE MEDICATIONS:  Continue current medications as per the [facility] which  will be sent over with the patient.   DIET:  Low cholesterol, low sodium diet.   ACTIVITY:  Weightbearing as tolerated.  Continue gait training, ambulation,  ADLs for total knee protocol, daily dressing change, may start showering.   FOLLOWUP:  Two weeks from surgery or following the discharge from the [facility]  unit.   DISPOSITION:  [facility] SACU.   CONDITION ON DISCHARGE:  Improved.      [name], P.A.      [name], M.D.  Electronically Signed    ALP/MEDQ  D:  05/24/2005  T:  05/24/2005  Job:  [id number]   cc:   [name], M.D.  Fax: [phone]   Arvilla Meres, M.D. [facility]  [facility]  [address]  [place]  [place] [address]

## 2010-11-15 NOTE — H&P (Signed)
Jeffery [name], Jeffery [name]               ACCOUNT NO.:  [id number]   MEDICAL RECORD NO.:  [id number]          PATIENT TYPE:  INP   LOCATION:  NA                           [facility]ACILITY[facility]   PHYSICIAN:  [name], M.D.    DATE OF BIRTH:  May 28, 1936   DATE OF ADMISSION:  04/14/2005  DATE OF DISCHARGE:                                HISTORY & PHYSICAL   CHIEF COMPLAINT:  Right knee pain.   HISTORY OF PRESENT ILLNESS:  The patient is a 75 year old male well known to  Dr. [name] who had previously undergone a left total knee replacement  arthroplasty back in July of this year.  He had done extremely well with his  left knee.  He was known to have bilateral knee arthritis.  The left knee  was taken care of.  He has reached the point where he has done well with the  left knee and presents to have the right done.  Risks and benefits  discussed.  The patient subsequently admitted to the hospital.   ALLERGIES:  No known drug allergies.   CURRENT MEDICATIONS:  1.  Avodart daily.  2.  Celebrex 200 mg daily.  3.  Potassium chloride 10 mEq daily.  4.  Lisinopril 40 mg daily.  5.  Felodipine 10 mg extended release tablet daily.  6.  Hydrochlorothiazide 25 mg daily.  7.  Omeprazole 20 mg daily.  8.  Zocor 40 mg in the evening.  9.  Tramadol 51 four times a day p.r.n.  10. Methocarbamol 50 mg 2 tablets 4 times a day.  11. Hydrocodone/acetaminophen 5/500 one or two q.4-6 h. p.r.n. pain.  12. Terazosin 4 mg p.o. nightly.   PAST MEDICAL HISTORY:  1.  Arthritis.  2.  Past history of hematuria, recent workup.  3.  Depression.  4.  Hyperlipidemia.  5.  Hypertension.  6.  Mitral valve prolapse.  7.  Benign prostatic hypertrophy.  8.  Renal cyst.   PAST SURGICAL HISTORY:  1.  Recent left total knee arthroplasty back in July 2006.  2.  Circumcision.  3.  Cataract surgery x2 on each eye for a total of 4 surgeries.  4.  Retinal detachment of the right eye x2.  5.  Right hip surgery in the  form of right total hip replacement.  6.  Hand surgery.  7.  Left knee surgery.  8.  Right knee surgery.   SOCIAL HISTORY:  Married, two children, three step children.  No alcohol,  nonsmoker.   FAMILY HISTORY:  Father deceased at age 71 with history of heart failure,  stroke, also benign brain tumor. Mother living, age 13, with lung tumor and  with cancer, grandfather with cancer.   REVIEW OF SYSTEMS:  GENERAL:  No fever, chills, night sweats. NEUROLOGIC: No  seizure, strokes paralysis.  RESPIRATORY: No shortness of breath, productive  cough, hemoptysis.  CARDIOVASCULAR: No chest pain, angina, orthopnea. GI: No  nausea, vomiting, diarrhea, constipation. GU: No dysuria, hematuria,  discharge. MUSCULOSKELETAL: See History of Present Illness.   PHYSICAL EXAMINATION:  VITAL SIGNS:  Pulse 64, respirations  12, blood  pressure 112/78.  GENERAL:  A 75 year old white male, well-nourished, well-developed, slightly  overweight, large frame.  Alert, oriented, cooperative.  Pleasant at time of  exam.  HEENT:  Normocephalic and atraumatic.  Pupils round and reactive.  Oropharynx clear.  TMs intact.  He does have bilateral hearing aids.  NECK:  Supple, no carotid bruits appreciated.  CHEST:  Clear anterior and posterior chest wall.  No rhonchi, rales, or  wheezing.  HEART:  Regular rate and rhythm.  No murmur.  S1, S2 noted.  ABDOMEN:  Soft, round, slightly protuberant.  Bowel sounds are present,  nontender.  RECTAL/BREASTS/GENITALIA: Not done, not pertinent to present illness.  EXTREMITIES: Right knee shows range of motion about 5 to 120 degrees. There  is no instability.  Marked crepitus on passive range of motion.   IMPRESSION:  1.  Osteoarthritis right knee.  2.  Arthritis.  3.  History of hematuria, recent workup.  4.  Depression.  5.  Hyperlipidemia.  6.  Hypertension.  7.  Mitral valve prolapse.  8.  Benign prostatic hypertrophy.  9.  Renal cystic disease   PLAN:  The patient  will be admitted to [facility] to undergo a  right total knee replacement arthroplasty. Surgery will be performed by Dr.  [name].      [name], P.A.      [name], M.D.  Electronically Signed    ALP/MEDQ  D:  04/13/2005  T:  04/13/2005  Job:  [id number]   cc:   [facility]  [place], [place]

## 2010-11-15 NOTE — H&P (Signed)
NAMETEE, RICHESON               ACCOUNT NO.:  [id number]   MEDICAL RECORD NO.:  [id number]          PATIENT TYPE:  INP   LOCATION:  1518                         FACILITY:  [facility]   PHYSICIAN:  [name], M.D.    DATE OF BIRTH:  1935-11-19   DATE OF ADMISSION:  01/20/2005  DATE OF DISCHARGE:                                HISTORY & PHYSICAL   CHIEF COMPLAINT:  Left knee pain.   HISTORY OF PRESENT ILLNESS:  Patient is a 75 year old male who has been seen  by Dr. Despina Hick for ongoing bilateral knee pain.  The left knee is more  symptomatic than the right knee.  It has been ongoing for quite some time  now.  The pain is interfering with his ability to do the things that he  wants to do.  He has reached the point where he would like to have something  done about it.  He has been seen in the office and found to have end-stage  arthritis in the left knee.  It is felt that he would benefit from  undergoing surgical intervention.  Risks and benefits of the procedure have  been discussed with the patient, and he elects to proceed with surgery.   ALLERGIES:  No known drug allergies.   CURRENT MEDICATIONS:  1.  Celebrex 200 mg daily.  2.  Potassium chloride 10 mEq daily.  3.  Lisinopril 40 mg daily.  4.  Felodipine 10 mg extended release daily.  5.  Hydrochlorothiazide 25 mg daily.  6.  Omeprazole 20 mg daily.  7.  Zocor 40 mg in the evening.  8.  Tramadol 50 mg 1 four times a day p.r.n.  9.  Methocarbamol 500 mg 2 tablets 4 times a day.  10. Hydrocodone/acetaminophen 5/500 1-2 q.4-6h. as needed for pain.  11. Terazosin 4 mg q.h.s.   PAST MEDICAL HISTORY:  1.  Arthritis.  2.  Recent hematuria.  3.  Depression.  4.  Hyperlipidemia.  5.  Hypertension.  6.  Mitral valve prolapse.  7.  Benign prostatic hypertrophy.  8.  Renal cysts.   PAST SURGICAL HISTORY:  1.  Circumcision.  2.  Cataract surgery x2 on each eye for a total of four surgeries.  3.  Retinal detachment, right eye,  x2.  4.  Right hip surgery in the form of a right total hip replacement.  5.  Hand surgery.  6.  Left knee surgery.  7.  Right knee surgery.   SOCIAL HISTORY:  Married.  Two children.  Three stepchildren.  No alcohol.  Nonsmoker.   FAMILY HISTORY:  Father deceased at age 30 with history of heart failure and  stroke.  Also benign brain tumors.   REVIEW OF SYSTEMS:  GENERAL:  No fevers, chills, night sweats.  NEURO:  No  seizures, syncope, paralysis.  RESPIRATORY:  No shortness of breath,  productive cough, or hemoptysis.  CARDIOVASCULAR:  No chest pain, angina,  orthopnea.  GI:  No nausea, vomiting, diarrhea, constipation.  GU:  No  dysuria, hematuria, or discharge.  MUSCULOSKELETAL:  Left knee, from the  history  of present illness.   PHYSICAL EXAMINATION:  VITAL SIGNS:  Pulse 68, respirations 12, blood  pressure 130/78.  GENERAL:  A 75 year old white male who is well-developed, well-nourished,  large-framed, slightly overweight.  Alert, oriented, cooperative, pleasant  at the time of exam.  HEENT:  Normocephalic and atraumatic.  Pupils are round and reactive.  EOMs  are intact.  Patient is noted to wear glasses.  Does have bilateral hearing  aids.  NECK:  Supple.  No carotid bruits.  CHEST:  Clear anterior and posterior chest wall.  No rales, rhonchi or  wheezes.  HEART:  Regular rate and rhythm without murmur.  A split S1, normal S2  noted.  ABDOMEN:  Soft, round, protuberant abdomen.  Bowel sounds present.  Nontender.  RECTAL/BREASTS/GENITALIA:  Not done.  Not pertinent to the present illness.  EXTREMITIES:  The left knee shows range-of-motion 5-110.  Moderate crepitus.  No effusion.  No instability.  Right knee shows range of motion 5-120.  No  effusion.  No instability.   IMPRESSION:  1.  Osteoarthritis, left knee.  2.  Recent history of hematuria, previous workup by his urologist.  3.  Depression.  4.  Hyperlipidemia.  5.  Hypertension.  6.  Mitral valve prolapse.   7.  Benign prostatic hypertrophy.  8.  Renal cystic disease.   PLAN:  Patient admitted to [facility] to undergo a left total knee  replacement arthroplasty.  The surgery will be performed by Dr. Trudee Grip.       ALP/MEDQ  D:  01/20/2005  T:  01/20/2005  Job:  [id number]   cc:   [facility]  [address]  [place], [place]  Attn:  Dr. Ninfa Meeker [phone]

## 2010-11-15 NOTE — H&P (Signed)
NAMEMAHMOUD, BLAZEJEWSKI               ACCOUNT NO.:  [id number]   MEDICAL RECORD NO.:  [id number]          PATIENT TYPE:  ORB   LOCATION:  4527                         FACILITY:  [facility]   PHYSICIAN:  Ranelle Oyster, M.D.DATE OF BIRTH:  08/20/1935   DATE OF ADMISSION:  04/18/2005  DATE OF DISCHARGE:                                HISTORY & PHYSICAL   CHIEF COMPLAINT:  Right knee pain.   HISTORY OF PRESENT ILLNESS:  This is a 75 year old white male who has a  history of prior left total knee replacement in July of 2006, right hip  replacement last year, who presented with increasing left knee pain, failing  conservative measures.  The patient elected to undergo a right total knee  replacement performed by Dr. [name] on April 14, 2005.  The patient  was placed on Coumadin postoperatively for DVT prophylaxis.  The patient was  making progress with physical therapy, enough to the point to allow him to  be at home unattended (the patient's wife works full-time during the day).  Thus, it was decided to transfer him to subacute rehabilitation to further  improve his functional abilities.   PAST MEDICAL HISTORY:  1.  Positive for osteoarthritis.  2.  History of hematuria.  3.  Depression.  4.  Increased lipids.  5.  Hypertension.  6.  Mitral valve prolapse.  7.  BPH.  8.  Renal cysts.  9.  Left knee replacement in July 2006.  10. Circumcision.  11. __________ surgery x2 with retinal detachment, right eye, x2.  12. Prior right total hip replacement.  13. Hand surgery.  14. Prior arthroscopic knee surgeries.   REVIEW OF SYSTEMS:  Positive for right knee pain.  The patient has some mild  constipation.  Full review of systems is in the written H&P.   SOCIAL HISTORY:  The patient is married with 2 children.  He has 3 step-  children, as well.  He does not smoke or drink.   FAMILY HISTORY:  Positive for heart failure, stroke, brain tumor, lung  cancer.   FUNCTIONAL ABILITIES:   The patient was independent and active prior to  admission.   PHYSICAL EXAMINATION:  VITAL SIGNS:  Temperature is 98.2, pulse 68,  respiratory rate 20, blood pressure 138/76.  Satting 93% on room air.  The  patient's weight is 260 pounds.  GENERAL:  She is pleasant, in no acute distress.  Is alert and oriented x3.  HEENT:  Pupils equal, round and reactive to light and accommodation.  Extraocular eye movements are intact.  NECK:  Supple.  No JVD or lymphadenopathy.  HEART:  Regular rate and rhythm.  LUNGS:  Clear to auscultation bilaterally without wheezing, rales, or  rhonchi.  ABDOMEN:  Soft, nontender.  EXTREMITIES:  No clubbing or cyanosis.  Edema - trace to 1+ in right lower  extremity.  The wound is clean and intact and fairly well approximated.  NEUROLOGIC:  Intact cognition and cranial nerves exam.  Motor and function  are generally in the upper extremities, 2/5 proximally in the right lower  extremity, 3 to  3+/5 in the distal lower extremity.  Left lower extremity is  4 to 4+/5.  Sensory examination is grossly intact.   ASSESSMENT:  1.  Functional deficit secondary to osteoarthritis of the right knee, status      post right total knee replacement, postoperative day #4.  Begin subacute      level rehabilitation with modified independent goals.  Estimated length      of stay is 5-7 days.  2.  Deep vein thrombosis prophylaxis with Lovenox and Coumadin.  3.  Hypertension.  Continue Plendil, hydrochlorothiazide, lisinopril.  4.  Hypercholesterolemia.  Continue Zocor.  5.  Benign prostatic hypertrophy.  Continue Hytrin.  Monitor voiding.  Will      check postvoid residuals (PVRs) as needed.  6.  Pain management with Percocet and Robaxin.  7.  Constipation.  Begin scheduled Senokot-S.      Ranelle Oyster, M.D.  Electronically Signed     ZTS/MEDQ  D:  04/18/2005  T:  04/18/2005  Job:  [id number]

## 2010-11-15 NOTE — Discharge Summary (Signed)
Jeffery [name], Jeffery [name]               ACCOUNT NO.:  [id number]   MEDICAL RECORD NO.:  [id number]          PATIENT TYPE:  ORB   LOCATION:  4529                         FACILITY:  [facility]   PHYSICIAN:  Ranelle Oyster, M.D.DATE OF BIRTH:  1936/04/26   DATE OF ADMISSION:  01/23/2005  DATE OF DISCHARGE:  01/29/2005                                 DISCHARGE SUMMARY   DISCHARGE DIAGNOSIS:  1.  Left total knee replacement secondary to osteoarthritis.  2.  Hypertension.  3.  Depression.  4.  Acute blood loss anemia.  5.  Hypokalemia supplemented.   HISTORY OF PRESENT ILLNESS:  Jeffery [name] is a 75 year old male with a history  of mitral valve prolapse, hypertension, bilateral knee pain, left greater  than right secondary to end stage OA.  He elected to undergo left total knee  replacement July 24 by Dr. Lequita Halt.  Postop, he is weight-bearing as  tolerated and on Coumadin for DVT prophylaxis with INR at 1.8 at admission.  Currently, the patient is requiring assist for transfers, mobility, as well  as ADLs, and subacute was consulted for further therapies.   PAST MEDICAL HISTORY:  Significant for OA, BPH, recent hematuria with  negative workup, question of renal calculi, renal cysts, depression,  dyslipidemia, retinal detachment right eye x 2, excision of cataract, mild  sleep apnea, left knee arthroscopy.   ALLERGIES:  No known drug allergies.   FAMILY HISTORY:  Positive for CVA and coronary artery disease.   SOCIAL HISTORY:  The patient is married, retired, but independent,  occasionally using a cane prior to admission.  He does not use any tobacco,  uses alcohol rarely.  Wife works.   HOSPITAL COURSE:  Jeffery [name] was admitted to subacute on January 23, 2005, for SACU level therapies to consist of PT and OT daily.  Past  admission, he was maintained on Coumadin for DVT prophylaxis.  The wound was  stable and the patient was participating in therapies.  Labs done past  admission  revealed hemoglobin 10.7, hematocrit 30.5, white count 8.9,  platelets 249.  The patient was maintained on iron supplement for postop  anemia initially.  The patient had some complaints regarding constipation  and nausea, so iron supplements were discontinued.  He was noted to have  mild hypokalemia with check of electrolytes with sodium 135, potassium 3.4,  chloride 101, CO2 24, BUN 11, creatinine 1.5, glucose 105.  His K-Dur was  increased to 20 mEq daily and the patient remains on this dose at the time  of discharge.  The patient's knee has been monitored along and has been  healing well without any signs or symptoms of infection, minimal edema as  noted with ecchymosis at left shin much improved.  The patient continued on  Coumadin at the time of discharge and is to continue on this through August  24 for complete DVT prophylaxis.  The patient is discharged on 2.5 mg  Coumadin daily with pharmacy to follow and adjust Coumadin.  At the time of  discharge, the patient was at modified independent levels for ADLs and  toileting, modified independent for meal preparation, modified independent  for transfer, modified independent for ambulating 150 feet with a rolling  walker.  Further follow up therapies to include home health PT by [facility].  On January 29, 2005, the patient is discharged to home.   DISCHARGE MEDICATIONS:  Avalide 0.5 mg q.h.s., Plendil 10 mg daily, Prinivil  40 mg daily, HCTZ 25 mg daily, K-Dur 20 mEq daily, Hytrin 4 mg q.h.s.,  Coumadin 5 mg 1/2 p.o. q.p.m., Robaxin 500 mg 1-2 q.i.d. p.r.n. spasms, Oxy-  IR 5-10 mg q.4-6h. p.r.n. pain.   DISCHARGE INSTRUCTIONS:  Activities:  As tolerated with the use of a walker.  Diet is regular.  Wound care:  Wash area with soap and water, keep clean and  dry.  The patient is to follow up with Dr. Lequita Halt for postop check.  Follow  up with Dr. Riley Kill as needed.      Greg Cutter, P.A.      Ranelle Oyster, M.D.   Electronically Signed    PP/MEDQ  D:  04/11/2005  T:  04/11/2005  Job:  [id number]   cc:   [name], M.D.  Fax: [phone]

## 2010-11-15 NOTE — Op Note (Signed)
NAMEBEATRIZ, SETTLES                           ACCOUNT NO.:  [id number]   MEDICAL RECORD NO.:  [id number]                   PATIENT TYPE:  INP   LOCATION:  X005                                 [facility]ACILITY:  [facility]   PHYSICIAN:  [name], M.D.                 DATE OF BIRTH:  [birth year]   DATE OF PROCEDURE:  09/18/2003  DATE OF DISCHARGE:                                 OPERATIVE REPORT   PREOPERATIVE DIAGNOSIS:  Osteoarthritis, right hip.   POSTOPERATIVE DIAGNOSIS:  Osteoarthritis, right hip.   PROCEDURE:  Right total hip arthroplasty.   SURGEON:  [name], M.D.   ASSISTANT:  [name], P.A.   ANESTHESIA:  General.   ESTIMATED BLOOD LOSS:  600.   DRAINS:  Hemovac x1.   COMPLICATIONS:  None.   CONDITION:  Stable to recovery.   CLINICAL NOTE:  Mr. Abdalla is a 75 year old male with severe end-stage  osteoarthritis of the right hip with pain refractory to nonoperative  management.  He presents now for right total hip arthroplasty.   PROCEDURE IN DETAIL:  After successful administration of general anesthesia,  the patient was placed in the left lateral decubitus position with the right  side up and held with the hip positioner.  The right lower extremity was  isolated from his perineum with plastic drapes and prepped and draped in the  usual sterile fashion.  Standard posterolateral incision was made with a 10  blade through the subcutaneous tissue to the level of the fascia lata, which  was incised in line with the skin incision.  The sciatic nerve was palpated  and protected, and short rotators isolated off the femur.  The capsulectomy  was performed, and the hip dislocated.  The center of the femoral head is  marked, and a trial prosthesis placed such that the center of the trial head  corresponds to the center of his native femoral head.  The osteotomy line is  marked on the femoral neck, and an osteotomy is made with an oscillating  saw.  The femur is  then retracted anteriorly to gain acetabular exposure.  The acetabular retractors were placed, and labrum and osteophytes removed.  Reaming starts at 47, progressed in increments of 2 to 57, and a 58 mm  Pinnacle acetabular shell is placed in an anatomic position and transfixed  with two dome screws.  Trial 36 mm neutral liner is placed.   The femur is prepared through a canal finder, then irrigation.  Axial  reaming was performed up to 15.5 mm and a proximal reaming through a 14F in  the sleeve machine to an extra, extra large.  A 14F extra, extra large trial  sleeve was placed with a 20 x 15 stem and a 36+8 neck.  We went about 20  degrees beyond his native version, which was neutral.  Placed a 36+0 head  and reduced the hip.  There was a large anterior osteophyte that was  impinging, so had to remove that.  Even with that out, I felt there was not  enough offset, so went to a 36+12 neck, which improved the offset  considerably and led to much better soft tissue clearance.  We had excellent  stability with full extension, full external rotation with 70 degrees  flexion, 40 degrees adduction, and about 70 degrees internal rotation, and  90 degrees flexion, 70 degrees internal rotation.  By placing the left leg  on top of the right, it was a couple of millimeters short.  I went to a 36+6  head, which corrected that and left a much better soft tissue tension.  I  was also able to get more internal rotation without impingement.  The hip  was then dislocated, and all trials were removed.  The permanent apex hole  eliminator is placed into the acetabular shell, then a 36 mm neutral  Ultramet metal liner is placed.  It is a metal-on-metal hip replacement.  The 69F extra, extra large sleeve is placed with a 20x15 stem and a 36+12  neck, about 20 degrees beyond his native anteversion.  Once the stem was  fully impacted, then the 36+6 head is placed, and the hip is reduced with  the same stability  parameters.  The wound is copiously irrigated with  antibiotic solution, and the short rotators were reattached to the femur  through drill holes.  The fascia lata is closed over a Hemovac drain with #1  Vicryl.  Subcu was closed with #1 and 2-0 Vicryl, and subcuticular running 4-  0 Monocryl.  Marcaine 0.25% 30 cc was injected into the subcu tissues.  Both  of the Steri-Strips and bulky sterile dressings applied.  The drain was  hooked to suction and placed into a knee immobilizer, awakened and  transported to recovery in stable condition.                                               [name], M.D.    FA/MEDQ  D:  09/18/2003  T:  09/19/2003  Job:  [id number]

## 2010-11-15 NOTE — Op Note (Signed)
NAMEDEMARRIO, Jeffery [name]               ACCOUNT NO.:  [id number]   MEDICAL RECORD NO.:  [id number]          PATIENT TYPE:  INP   LOCATION:  0006                         FACILITY:  [facility]   PHYSICIAN:  [name], M.D.    DATE OF BIRTH:  1936-01-23   DATE OF PROCEDURE:  01/20/2005  DATE OF DISCHARGE:                                 OPERATIVE REPORT   PREOPERATIVE DIAGNOSIS:  Osteoarthritis left knee.   POSTOPERATIVE DIAGNOSIS:  Osteoarthritis left knee.   PROCEDURE:  Left total knee arthroplasty.   SURGEON:  Jeffery [name]   ASSISTANT:  Jeffery Peace, PA-C   ANESTHESIA:  General with postop Marcaine pain pump.   ESTIMATED BLOOD LOSS:  Minimal.   DRAIN:  Hemovac x 1.   TOURNIQUET TIME:  47 minutes at 300 mmHg.   COMPLICATIONS:  None.   CONDITION:  Stable to recovery.   CLINICAL NOTE:  Jeffery [name] is a 75 year old male with severe end-stage  arthritis of both knees, left more symptomatic than right.  He has failed  nonoperative management including injections and presents now for left total  knee arthroplasty.   PROCEDURE IN DETAIL:  After the successful initiation of general anesthetic,  a tourniquet is placed on the left thigh and left lower extremity prepped  and draped in the usual sterile fashion.  Extremity is wrapped in Esmarch,  knee flexed, tourniquet inflated 300 mmHg.  Standard midline incision is  made with a 10 blade through subcutaneous tissue to the level of the  extensor mechanism.  A fresh blade is used to make a medial parapatellar  arthrotomy, and the soft tissue over the proximal and medial tibia is  subperiosteally elevated to the joint line with the knife and into the  semimembranosus bursa with a Cobb elevator.  Soft tissue over the proximal  lateral tibia is also elevated with attention being paid to avoiding the  patellar tendon on tibial tubercle.  The patella is everted, knee flexed 90  degrees, ACL and PCL removed.  Drill was used to create a starting  hole, and  the distal femur canal is irrigated. A 5-degree left valgus alignment guide  is placed and referencing off the posterior condyles, rotation is marked and  the block pinned to remove 10 mm off the distal femur.  Distal femoral  resection is made with an oscillating saw.  Sizing block is placed, and a  size 6 is most appropriate for the femoral component.  The rotation is  marked off the epicondylar axis.  Size 6 cutting block is placed, and the  anterior, posterior, and chamfer cuts are made.   Tibia is subluxed forward and the menisci removed.  Extramedullary tibial  alignment guides is placed, referencing proximally at the medial aspect of  the tibial tubercle and distally along the second metatarsal axis and tibial  crest.  The block is pinned to remove 10 mm off the nondeficient lateral  side.  Tibial resection is made with an oscillating saw.  Size 5 is the most  appropriate tibial component, and then the proximal tibia is prepared with  the  modular drill and keel punch for the size 5.  Femoral preparation is  completed with the intercondylar cut for the size 6.   The size 5 mobile bearing tibial trial and size 6 posterior stabilized  femoral trial and a 10 mm posterior stabilized rotating platform insert  trial are placed.  With the 10, full extension is achieved with excellent  varus and valgus balance throughout full range of motion.  Patella was  everted and thickness measured to be 25 mm.  Freehand resection is taken  down to 14 mm, 41 template is placed, lug holes were drilled, trial patella  was placed and it tracks normally.  The osteophytes are then removed off the  posterior femur with the trial in place.  All trials are removed, and the  cut bone surfaces are prepared with pulsatile lavage.  Cement is mixed and  once ready for implantation, the size 5 mobile bearing tibial tray, size 6  posterior stabilized femur, and 41 patella are cemented into place.  The   patella is held with the clamp.  The 10 mm trial inserts placed, knee held  in full extension, and all extruded cement removed.  Once the cement is  fully hardened, then the permanent 10 mm posterior stabilized rotating  platform insert is placed into the tibial tray.  The wound is copiously  irrigated with saline solution and the extensor mechanism closed over a  Hemovac drain with interrupted #1 Vicryl.  Tourniquet is released for a  total time of 47 minutes.  Flexion against gravity is 135 degrees.  The  subcu is closed with interrupted 2-0 Vicryl, subcuticular running 4-0  Monocryl.  The catheter for the Marcaine pain pump is placed, and the is  initiated.  Hemovac drain is hooked to suction.  Steri-Strips and a bulky  sterile dressing are applied.  He is placed into a knee immobilizer,  awakened, and transported to recovery in stable condition.       FA/MEDQ  D:  01/20/2005  T:  01/20/2005  Job:  [id number]

## 2010-11-15 NOTE — Discharge Summary (Signed)
NAMELYNDALL, WINDT               ACCOUNT NO.:  [id number]   MEDICAL RECORD NO.:  [id number]          PATIENT TYPE:  ORB   LOCATION:  4529                         FACILITY:  [facility]   PHYSICIAN:  Ranelle Oyster, M.D.DATE OF BIRTH:  1935-09-12   DATE OF ADMISSION:  04/18/2005  DATE OF DISCHARGE:  04/24/2005                                 DISCHARGE SUMMARY   DISCHARGE DIAGNOSES:  1.  Right knee osteoarthritis requiring right total knee replacement.  2.  Hypertension.  3.  Postoperative anemia.  4.  Benign prostatic hypertrophy.   HISTORY OF PRESENT ILLNESS:  Mr. Muzyka is a 75 year old male with history  of hypertension, OA bilateral knees, left total knee replacement in  September 2006, and now with right knee pain with failure of conservative  therapy.  He elected to undergo right total knee replacement on April 14, 2005, by Dr. Lequita Halt.  Postoperatively, he is weightbearing as tolerated, on  Coumadin for DVT prophylaxis.  Postoperative course was complicated by  episode of chest pain April 14, 2005, requiring transfer to telemetry by  Dr. Gala Romney.  Serial enzymes were checked and were noted to be negative.  Cardiology feels patient's chest pain is noncardiac in nature and probably  secondary to musculoskeletal anatomy.  Currently, patient is requiring  assist with transfers and ambulations and subacute was consulted for further  therapies.   PAST MEDICAL HISTORY:  1.  Retinal detachment x2.  2.  Depression.  3.  Right total hip replacement, no fall.  4.  Left total knee replacement July 2006.  5.  Lumbar spondylosis.  6.  Benign prostatic hypertrophy.  7.  Mild sleep apnea.  8.  Dyslipidemia.  9.  Hypertension.  10. Obesity.  11. Noncardiac chest pain and hematuria.   ALLERGIES:  No known drug allergies.   FAMILY HISTORY:  Positive for CVA, lung cancer and coronary artery disease.   SOCIAL HISTORY:  Patient is married.  Was independent and driving prior to  admission.  He does not use any tobacco.  Uses alcohol rarely.   HOSPITAL COURSE:  Mr. Izaya Netherton was admitted subacute on April 18, 2005, for SACU level therapies to consist of PT and OT daily.  Past  admission, patient was continued on Coumadin for DVT prophylaxis.  Blood  pressures were monitored on Plendil, hydrochlorothiazide  and Zestril on  b.i.d. basis.  __________ good control and went from 120s to 130 systolic,  60s to 80s diastolic.  Patient has been afebrile during his stay.  Has been  continent of bowel and bladder without any problems with urinary retention.   Labs done past admission reveal hemoglobin 10.8, hematocrit 31.8, white  count 6.9, platelets 257.  Sodium 141, potassium 3.2, chloride 107, CO2 28,  BUN 21, creatinine 1.3, glucose 100.  Secondary to hypokalemia, his  potassium dose was increased.  Recheck labs on April 23, 2005, shows  resolution of hypokalemia with potassium at 4.0.   During his stay in subacute, Mr. Campi made good progress.  By the time of  discharge, he was at modified independent level for  ADLs, modified  independent for toileting, modified independent for advanced ADLs, he was  modified independent for bed mobility and transfer, modified independent for  ambulating greater than 200 feet with rolling walker, required supervision  to navigate one step with rolling walker.  Further follow-up therapies to  include home health PT and OT by [facility].  Patient to continue on  Coumadin through May 15, 2005, with home health RN to check protime  April 25, 2005.  On April 24, 2005, the patient is discharged to home.   DISCHARGE MEDICATIONS:  1.  Avodart 0.5 mg nightly.  2.  Plendil 10 mg a day.  3.  Hydrochlorothiazide 25 mg a day.  4.  Prinivil 40 mg a day.  5.  Prilosec one per day.  6.  Hytrin 4 mg per day.  7.  Senna S2 p.o. nightly.  8.  Lipitor 20 mg per day.  9.  Celebrex 200 mg per day.  10. K-Dur 20 mEq a  day.  11. Lidocaine patch to right ankle on 8 a.m., off 8 p.m.  12. Oxy IR 5 to 10 mg q.4-6h. p.r.n. pain.  13. Robaxin 500 mg q.i.d. p.r.n.  14. Coumadin 10 mg p.o. q.p.m.   ACTIVITY:  As tolerated with use of walker.   DIET:  Regular.   WOUND CARE:  Wash area with soap and water, keep clean and dry.   SPECIAL INSTRUCTIONS:  Home health RN to check protime April 15, 2005,  with [facility] to provide PT, OT and RN.  Also [facility]  to provide  home CPM for range of motion.   FOLLOW UP:  Patient to follow up with Dr. Lequita Halt for appointment in one  week, to follow up with Dr. Riley Kill as needed.      Greg Cutter, P.A.      Ranelle Oyster, M.D.  Electronically Signed    PP/MEDQ  D:  04/29/2005  T:  04/30/2005  Job:  [id number]   cc:   [name], M.D.  Fax: [phone]   Dr. Derrell Lolling

## 2010-11-15 NOTE — H&P (Signed)
Jeffery [name], Jeffery [name]                           ACCOUNT NO.:  [id number]   MEDICAL RECORD NO.:  [id number]                   PATIENT TYPE:  LINP   LOCATION:                                       FACILITY:  [facility]   PHYSICIAN:  [name], M.D.                 DATE OF BIRTH:  08-09-1935   DATE OF ADMISSION:  09/18/2003  DATE OF DISCHARGE:                                HISTORY & PHYSICAL   DATE OF OFFICE VISIT AND HISTORY AND PHYSICAL:  September 07, 2003.   CHIEF COMPLAINT:  Right hip pain.   HISTORY OF PRESENT ILLNESS:  The patient is a 75 year old male, who has been  by Dr. Lequita Halt for ongoing right hip pain.  He was initially worked up by  Dr. Francena Hanly for an arthritic hip.  The pain has been progressively  getting worse over time.  It is to the point where it is interfering with  his daily activities.  X-rays in the office showed end-stage arthritis in  the right hip with bone-on-bone changes but no evidence of any significant  bone loss.  Due to his symptoms, he was felt to be a candidate for right hip  replacement.  Risks and benefits discussed.  The patient was evaluated by  Dr. Lequita Halt and elected to proceed with surgery.   ALLERGIES:  No known drug allergies.   CURRENT MEDICATIONS:  1. Ultracet p.r.n.  2. Vicodin p.r.n.  3. Lisinopril 40 mg.  4. Felodipine 10 mg.  5. Hydrochlorothiazide 25 mg.  6. Omeprazole 20 mg.  7. Celebrex 200 mg.  8. Sandostatin 40 mg.  9. Terazosin 2 mg.  10.      Tylenol p.r.n.   PAST MEDICAL HISTORY:  1. Bilateral knees osteoarthritis.  2. Right hip osteoarthritis.  3. Hypertension.  4. Hiatal hernia.  5. Reflux disease.  6. Benign prostatic hypertrophy.  7. Mitral valve prolapse.  8. Difficulty hearing with hearing loss, utilizes bilateral hearing aids.  9. 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  This has been worked up with previous hospitalizations.     All work-ups have been  negative.   PAST SURGICAL HISTORY:  1. Detached retinal surgery in the right eye x 2.  2. Cataract surgery bilaterally.  3. Colonoscopy.  4. Lens implants bilaterally.  5. Bilateral knee surgeries.   FAMILY HISTORY:  Mother living, age 46 with arthritis and history of valve  replacement.  She also has a hiatal hernia and arterial disease with  coronary stents.  Father deceased age 94 with a history of brain tumor and  stroke.   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  He has  two daughters, three other children through a second marriage.  No tobacco  products with the exception of  an occasional cigar, occasional seldom social  intake of alcohol.   REVIEW OF SYSTEMS:  GENERAL:  No fevers, chills, or night sweats.  NEUROLOGIC:  No seizures, syncope, paralysis.  RESPIRATORY:  No shortness of  breath, productive cough, or hemoptysis.  CARDIOVASCULAR:  The history of  inverted T-waves on EKG.  No chest pain, angina, or orthopnea.  GI:  No  nausea, vomiting, diarrhea, or constipation.  GU:  No dysuria, hematuria, or  discharge.  MUSCULOSKELETAL:  Pertinent to that of the history of present  illness.   PHYSICAL EXAMINATION:  VITAL SIGNS:  Pulse 68, respirations 14, blood  pressure 148/88.  GENERAL:  A 75 year old white male, well-nourished, well-developed, large  frame; alert, oriented, and cooperative.  Appears to ge a good historian.  HEENT:  Normocephalic, atraumatic.  Pupils are round and reactive.  Oropharynx is clear.  EOMs are intact.  NECK:  Supple.  No carotid bruits are appreciated.  CHEST:  Clear anterior and posterior chest walls.  No rhonchi, rales, or  wheezing.  HEART:  Regular rate and rhythm, no murmurs.  ABDOMEN:  Soft, slightly round, protuberant abdomen.  Bowel sounds are  present.  RECTAL/BREASTS/GENITALIA:  Not done.  Not pertinent to present illness.  EXTREMITIES:  Right hip shows flexion of 95 degrees.  There is only 10  degrees of  internal rotation, 20 degrees of external rotation, about 30  degrees of abduction.  Motor function is intact.   IMPRESSION:  1. Osteoarthritis, right hip.  2. Bilateral knee osteoarthritis.  3. Hypertension.  4. Hiatal hernia.  5. Reflux disease.  6. Benign prostatic hypertrophy.  7. Mitral valve prolapse.  8. Difficulty hearing with bilateral hearing aids.  9. History of inverted T-waves per EKG.   PLAN:  The patient will be admitted to [facility] to undergo right  total hip arthroplasty.  Surgery will be performed by Dr. [name].     [name], P.A.              [name], M.D.    ALP/MEDQ  D:  09/23/2003  T:  09/23/2003  Job:  [id number]

## 2011-01-15 ENCOUNTER — Other Ambulatory Visit: Payer: Self-pay | Admitting: Sports Medicine

## 2011-01-15 ENCOUNTER — Ambulatory Visit
Admission: RE | Admit: 2011-01-15 | Discharge: 2011-01-15 | Disposition: A | Discharge status: Home / Self Care | Payer: Self-pay | Source: Ambulatory Visit | Attending: Sports Medicine | Admitting: Sports Medicine

## 2011-01-15 DIAGNOSIS — M25512 Pain in left shoulder: Secondary | ICD-10-CM

## 2011-06-18 ENCOUNTER — Other Ambulatory Visit: Payer: Self-pay | Admitting: Sports Medicine

## 2011-06-18 DIAGNOSIS — M25512 Pain in left shoulder: Secondary | ICD-10-CM

## 2011-06-20 ENCOUNTER — Ambulatory Visit
Admission: RE | Admit: 2011-06-20 | Discharge: 2011-06-20 | Disposition: A | Discharge status: Home / Self Care | Payer: Medicare Other | Source: Ambulatory Visit | Attending: Sports Medicine | Admitting: Sports Medicine

## 2011-06-20 DIAGNOSIS — M25512 Pain in left shoulder: Secondary | ICD-10-CM

## 2011-08-05 ENCOUNTER — Ambulatory Visit: Payer: Medicare Other | Admitting: Physical Therapy

## 2011-08-19 ENCOUNTER — Ambulatory Visit: Payer: Medicare Other | Attending: Orthopedic Surgery | Admitting: Physical Therapy

## 2011-08-19 DIAGNOSIS — M25519 Pain in unspecified shoulder: Secondary | ICD-10-CM | POA: Insufficient documentation

## 2011-08-19 DIAGNOSIS — M25619 Stiffness of unspecified shoulder, not elsewhere classified: Secondary | ICD-10-CM | POA: Insufficient documentation

## 2011-08-19 DIAGNOSIS — M6281 Muscle weakness (generalized): Secondary | ICD-10-CM | POA: Insufficient documentation

## 2011-08-19 DIAGNOSIS — IMO0001 Reserved for inherently not codable concepts without codable children: Secondary | ICD-10-CM | POA: Insufficient documentation

## 2011-08-22 ENCOUNTER — Ambulatory Visit: Payer: Medicare Other | Admitting: Physical Therapy

## 2011-08-25 ENCOUNTER — Encounter: Payer: Self-pay | Admitting: Physical Therapy

## 2011-08-27 ENCOUNTER — Ambulatory Visit: Payer: Medicare Other | Admitting: Physical Therapy

## 2011-08-28 ENCOUNTER — Ambulatory Visit: Payer: Medicare Other | Admitting: Physical Therapy

## 2011-08-29 ENCOUNTER — Encounter: Payer: Self-pay | Admitting: Physical Therapy

## 2011-09-05 ENCOUNTER — Encounter: Payer: Self-pay | Admitting: Physical Therapy

## 2011-09-12 ENCOUNTER — Ambulatory Visit: Payer: Medicare Other | Attending: Orthopedic Surgery | Admitting: Physical Therapy

## 2011-09-12 DIAGNOSIS — IMO0001 Reserved for inherently not codable concepts without codable children: Secondary | ICD-10-CM | POA: Insufficient documentation

## 2011-09-12 DIAGNOSIS — M25519 Pain in unspecified shoulder: Secondary | ICD-10-CM | POA: Insufficient documentation

## 2011-09-12 DIAGNOSIS — M25619 Stiffness of unspecified shoulder, not elsewhere classified: Secondary | ICD-10-CM | POA: Insufficient documentation

## 2011-09-12 DIAGNOSIS — M6281 Muscle weakness (generalized): Secondary | ICD-10-CM | POA: Insufficient documentation

## 2011-09-19 ENCOUNTER — Encounter: Payer: Self-pay | Admitting: Physical Therapy

## 2011-09-23 ENCOUNTER — Ambulatory Visit: Payer: Medicare Other | Admitting: Physical Therapy

## 2014-10-20 ENCOUNTER — Other Ambulatory Visit: Payer: Self-pay | Admitting: Orthopaedic Surgery

## 2014-10-20 DIAGNOSIS — M419 Scoliosis, unspecified: Secondary | ICD-10-CM

## 2014-11-14 ENCOUNTER — Ambulatory Visit
Admission: RE | Admit: 2014-11-14 | Discharge: 2014-11-14 | Disposition: A | Discharge status: Home / Self Care | Payer: Medicare HMO | Source: Ambulatory Visit | Attending: Orthopaedic Surgery | Admitting: Orthopaedic Surgery

## 2014-11-14 DIAGNOSIS — M419 Scoliosis, unspecified: Secondary | ICD-10-CM

## 2016-05-26 ENCOUNTER — Other Ambulatory Visit (HOSPITAL_COMMUNITY): Payer: Self-pay

## 2016-05-26 ENCOUNTER — Encounter (HOSPITAL_COMMUNITY)
Admission: RE | Admit: 2016-05-26 | Discharge: 2016-05-26 | Disposition: A | Discharge status: Home / Self Care | Payer: Medicare Other | Source: Ambulatory Visit | Attending: Orthopedic Surgery | Admitting: Orthopedic Surgery

## 2016-05-26 ENCOUNTER — Encounter (HOSPITAL_COMMUNITY): Payer: Self-pay

## 2016-05-26 DIAGNOSIS — I10 Essential (primary) hypertension: Secondary | ICD-10-CM

## 2016-05-26 DIAGNOSIS — Z01818 Encounter for other preprocedural examination: Secondary | ICD-10-CM

## 2016-05-26 HISTORY — DX: Anxiety disorder, unspecified: F41.9

## 2016-05-26 HISTORY — DX: Presence of spectacles and contact lenses: Z97.3

## 2016-05-26 HISTORY — DX: Unspecified glaucoma: H40.9

## 2016-05-26 HISTORY — DX: Nocturia: R35.1

## 2016-05-26 HISTORY — DX: Gastro-esophageal reflux disease without esophagitis: K21.9

## 2016-05-26 HISTORY — DX: Major depressive disorder, single episode, unspecified: F32.9

## 2016-05-26 HISTORY — DX: Unspecified osteoarthritis, unspecified site: M19.90

## 2016-05-26 HISTORY — DX: Unspecified urinary incontinence: R32

## 2016-05-26 HISTORY — DX: Sleep apnea, unspecified: G47.30

## 2016-05-26 HISTORY — DX: Essential (primary) hypertension: I10

## 2016-05-26 HISTORY — DX: Depression, unspecified: F32.A

## 2016-05-26 HISTORY — DX: Hyperlipidemia, unspecified: E78.5

## 2016-05-26 HISTORY — DX: Frequency of micturition: R35.0

## 2016-05-26 HISTORY — DX: Dyspnea, unspecified: R06.00

## 2016-05-26 HISTORY — DX: Other amnesia: R41.3

## 2016-05-26 HISTORY — DX: Personal history of pneumonia (recurrent): Z87.01

## 2016-05-26 HISTORY — DX: Benign prostatic hyperplasia without lower urinary tract symptoms: N40.0

## 2016-05-26 HISTORY — DX: Polyneuropathy, unspecified: G62.9

## 2016-05-26 HISTORY — DX: Unspecified hearing loss, unspecified ear: H91.90

## 2016-05-26 LAB — BASIC METABOLIC PANEL
ANION GAP: 8 (ref 5–15)
BUN: 19 mg/dL (ref 6–20)
CHLORIDE: 110 mmol/L (ref 101–111)
CO2: 24 mmol/L (ref 22–32)
Calcium: 10.9 mg/dL — ABNORMAL HIGH (ref 8.9–10.3)
Creatinine, Ser: 1.1 mg/dL (ref 0.61–1.24)
GFR calc Af Amer: >60 mL/min (ref >60)
Glucose, Bld: 86 mg/dL (ref 65–99)
POTASSIUM: 4.4 mmol/L (ref 3.5–5.1)
SODIUM: 142 mmol/L (ref 135–145)

## 2016-05-26 LAB — CBC
HCT: 43.8 % (ref 39.0–52.0)
HEMOGLOBIN: 14.6 g/dL (ref 13.0–17.0)
MCH: 30.2 pg (ref 26.0–34.0)
MCHC: 33.3 g/dL (ref 30.0–36.0)
MCV: 90.5 fL (ref 78.0–100.0)
PLATELETS: 143 10*3/uL — AB (ref 150–400)
RBC: 4.84 MIL/uL (ref 4.22–5.81)
RDW: 13.7 % (ref 11.5–15.5)
WBC: 6.1 10*3/uL (ref 4.0–10.5)

## 2016-05-26 LAB — SURGICAL PCR SCREEN
MRSA, PCR: NEGATIVE
Staphylococcus aureus: NEGATIVE

## 2016-05-26 NOTE — Progress Notes (Signed)
PCP - [facility] Jenita SeashoreSamuel Kelly; [facility] Cardiologist - Dr. Rozetta NunneryJenyung Chiu  EKG - pt. States that he has had one within the year - requested from all 3 offices CXR - denies  Echo - 12/2015 Stress [facility] Cardiac cath - requested from Dr. Rhona Leavenshiu  Patient denies chest pain and shortness of breath at PAT appointment.

## 2016-05-26 NOTE — Pre-Procedure Instructions (Signed)
Jeffery MerrittsGerard Halbur  05/26/2016      [facility] - [place], [place] - [id number] [place] [address] Phone: [phone] Fax: [phone]    Your procedure is scheduled on Thursday, November 30th, 2017.  Report [facility] Admitting at 8:00 A.M.   Call this number if you have problems the morning of surgery:  [phone]   Remember:  Do not eat food or drink liquids after midnight.   Take these medicines the morning of surgery with A SIP OF WATER: Amlodipine (Norvasc), Gabapentin (Neurontin), Hydrocodone-acetaminophen (Norco) if needed, Omeprazole (Prilosec).  Stop taking: Meloxicam (Mobic), Aspirin, NSAIDS, Aleve, Naproxen, Ibuprofen, Advil, Motrin, BC's, Goody's, Fish oil, all herbal medications, and all vitamins.    Do not wear jewelry, make-up or nail polish.  Do not wear lotions, powders, or colognes, or deoderant.  Men may shave face and neck.  Do not bring valuables to the hospital.  [facility] is not responsible for any belongings or valuables.  Contacts, dentures or bridgework may not be worn into surgery.  Leave your suitcase in the car.  After surgery it may be brought to your room.  For patients admitted to the hospital, discharge time will be determined by your treatment team.  Patients discharged the day of surgery will not be allowed to drive home.   Special instructions:  Preparing for Surgery.   [facility]- Preparing For Surgery  Before surgery, you can play an important role. Because skin is not sterile, your skin needs to be as free of germs as possible. You can reduce the number of germs on your skin by washing with CHG (chlorahexidine gluconate) Soap before surgery.  CHG is an antiseptic cleaner which kills germs and bonds with the skin to continue killing germs even after washing.  Please do not use if you have an allergy to CHG or antibacterial soaps. If your skin becomes  reddened/irritated stop using the CHG.  Do not shave (including legs and underarms) for at least 48 hours prior to first CHG shower. It is OK to shave your face.  Please follow these instructions carefully.   1. Shower the NIGHT BEFORE SURGERY and the MORNING OF SURGERY with CHG.   2. If you chose to wash your hair, wash your hair first as usual with your normal shampoo.  3. After you shampoo, rinse your hair and body thoroughly to remove the shampoo.  4. Use CHG as you would any other liquid soap. You can apply CHG directly to the skin and wash gently with a scrungie or a clean washcloth.   5. Apply the CHG Soap to your body ONLY FROM THE NECK DOWN.  Do not use on open wounds or open sores. Avoid contact with your eyes, ears, mouth and genitals (private parts). Wash genitals (private parts) with your normal soap.  6. Wash thoroughly, paying special attention to the area where your surgery will be performed.  7. Thoroughly rinse your body with warm water from the neck down.  8. DO NOT shower/wash with your normal soap after using and rinsing off the CHG Soap.  9. Pat yourself dry with a CLEAN TOWEL.   10. Wear CLEAN PAJAMAS   11. Place CLEAN SHEETS on your bed the night of your first shower and DO NOT SLEEP WITH PETS.  Day of Surgery: Do not apply any deodorants/lotions. Please wear clean clothes to the hospital/surgery center.      Please read  over the following fact sheets that you were given. MRSA Information

## 2016-05-27 NOTE — Progress Notes (Signed)
Anesthesia Chart Review:   Pt is a 80 year old male scheduled for R reverse shoulder arthroplasty on 05/29/2016 with Francena HanlyKevin Supple, MD  - Cardiologist is J. Holley RaringAndy Chiu, MD, last office visit 12/26/15, 6 month f/u recommended (notes in care everywhere) - PCP is Jenita SeashoreSamuel Kelly, MD (notes in care everywhere)  PMH includes:  CAD (moderate disease by 2012 cath), HTN, hyperlipidemia, OSA, glaucoma, memory impairment, GERD. Hard of hearing. Former smoker. BMI 39.5  Medications include: amlodipine, ASA, lipitor, colestipol, lasix, prilosec, potassium, timolol.   Preoperative labs reviewed.   EKG requested from cardiologist's office but not available within last 12 months. Will obtain DOS.   Echo 12/31/15:  1. Study technically difficult due to body habitus. 2. LV cavity normal in size. Concentric LVH. EF 53%. 3. LA cavity mildly dilated. 4. Trileaflet aortic valve with no regurgitation noted. 5. Structurally normal mitral valve with mild regurgitation. 6. Structurally normal tricuspid valve with mild regurgitation. 7. RV not well visualized, question borderline dilated and hypokinetic.  Cardiac cath 07/29/10 ([facility]  1. Moderate nonobstructive CAD, with diffuse vessel ectasia. LAD proximal 20%, distal 50%. CX ostial 30%. RCA proximal 50%.  2. Normal LV function.  3. Recommendations: Medical therapy- no target lesion for intervention is seen.  If EKG acceptable DOS, I anticipate pt can proceed with surgery as scheduled.   Rica Mastngela Edelmira Gallogly, FNP-BC [facility]/Anesthesiology Phone: [phone] 05/27/2016 4:48 PM

## 2016-05-28 MED ORDER — SODIUM CHLORIDE 0.9 % IV SOLN
1000.0000 mg | INTRAVENOUS | Status: AC
  Administered 2016-05-29: 1000 mg via INTRAVENOUS
  Filled 2016-05-28: qty 10

## 2016-05-28 MED ORDER — DEXTROSE 5 % IV SOLN
3.0000 g | INTRAVENOUS | Status: AC
  Administered 2016-05-29: 3 g via INTRAVENOUS
  Filled 2016-05-28 (×2): qty 3000

## 2016-05-29 ENCOUNTER — Inpatient Hospital Stay (HOSPITAL_COMMUNITY): Payer: Medicare Other | Admitting: Anesthesiology

## 2016-05-29 ENCOUNTER — Inpatient Hospital Stay (HOSPITAL_COMMUNITY): Payer: Medicare Other | Admitting: Emergency Medicine

## 2016-05-29 ENCOUNTER — Encounter (HOSPITAL_COMMUNITY)
Admission: RE | Disposition: A | Discharge status: Home Health | Payer: Self-pay | Source: Ambulatory Visit | Attending: Orthopedic Surgery

## 2016-05-29 ENCOUNTER — Inpatient Hospital Stay (HOSPITAL_COMMUNITY)
Admission: RE | Admit: 2016-05-29 | Discharge: 2016-05-30 | DRG: 483 | Disposition: A | Discharge status: Home Health | Payer: Medicare Other | Source: Ambulatory Visit | Attending: Orthopedic Surgery | Admitting: Orthopedic Surgery

## 2016-05-29 ENCOUNTER — Encounter (HOSPITAL_COMMUNITY): Payer: Self-pay | Admitting: Certified Registered Nurse Anesthetist

## 2016-05-29 DIAGNOSIS — Z87891 Personal history of nicotine dependence: Secondary | ICD-10-CM | POA: Diagnosis not present

## 2016-05-29 DIAGNOSIS — H9190 Unspecified hearing loss, unspecified ear: Secondary | ICD-10-CM | POA: Diagnosis present

## 2016-05-29 DIAGNOSIS — F419 Anxiety disorder, unspecified: Secondary | ICD-10-CM | POA: Diagnosis present

## 2016-05-29 DIAGNOSIS — N400 Enlarged prostate without lower urinary tract symptoms: Secondary | ICD-10-CM | POA: Diagnosis present

## 2016-05-29 DIAGNOSIS — F329 Major depressive disorder, single episode, unspecified: Secondary | ICD-10-CM | POA: Diagnosis present

## 2016-05-29 DIAGNOSIS — I10 Essential (primary) hypertension: Secondary | ICD-10-CM | POA: Diagnosis present

## 2016-05-29 DIAGNOSIS — H409 Unspecified glaucoma: Secondary | ICD-10-CM | POA: Diagnosis present

## 2016-05-29 DIAGNOSIS — G4730 Sleep apnea, unspecified: Secondary | ICD-10-CM | POA: Diagnosis present

## 2016-05-29 DIAGNOSIS — M1990 Unspecified osteoarthritis, unspecified site: Secondary | ICD-10-CM | POA: Diagnosis present

## 2016-05-29 DIAGNOSIS — M75101 Unspecified rotator cuff tear or rupture of right shoulder, not specified as traumatic: Principal | ICD-10-CM | POA: Diagnosis present

## 2016-05-29 DIAGNOSIS — Z471 Aftercare following joint replacement surgery: Secondary | ICD-10-CM

## 2016-05-29 DIAGNOSIS — K219 Gastro-esophageal reflux disease without esophagitis: Secondary | ICD-10-CM | POA: Diagnosis present

## 2016-05-29 DIAGNOSIS — Z96611 Presence of right artificial shoulder joint: Secondary | ICD-10-CM

## 2016-05-29 DIAGNOSIS — E785 Hyperlipidemia, unspecified: Secondary | ICD-10-CM | POA: Diagnosis present

## 2016-05-29 DIAGNOSIS — Z96643 Presence of artificial hip joint, bilateral: Secondary | ICD-10-CM | POA: Diagnosis present

## 2016-05-29 DIAGNOSIS — Z79899 Other long term (current) drug therapy: Secondary | ICD-10-CM

## 2016-05-29 DIAGNOSIS — Z7982 Long term (current) use of aspirin: Secondary | ICD-10-CM | POA: Diagnosis not present

## 2016-05-29 HISTORY — PX: REVERSE SHOULDER ARTHROPLASTY: SHX5054

## 2016-05-29 SURGERY — ARTHROPLASTY, SHOULDER, TOTAL, REVERSE
Anesthesia: General | Laterality: Right

## 2016-05-29 MED ORDER — METOCLOPRAMIDE HCL 5 MG/ML IJ SOLN: 5.0000 mg | Freq: Three times a day (TID) | INTRAMUSCULAR | Status: DC | PRN

## 2016-05-29 MED ORDER — ONDANSETRON HCL 4 MG/2ML IJ SOLN
INTRAMUSCULAR | Status: DC | PRN
  Administered 2016-05-29: 4 mg via INTRAVENOUS

## 2016-05-29 MED ORDER — MIDAZOLAM HCL 2 MG/2ML IJ SOLN
INTRAMUSCULAR | Status: DC | PRN
  Administered 2016-05-29 (×2): 1 mg via INTRAVENOUS

## 2016-05-29 MED ORDER — ASPIRIN EC 81 MG PO TBEC
81.0000 mg | DELAYED_RELEASE_TABLET | Freq: Two times a day (BID) | ORAL | Status: DC
  Administered 2016-05-29 – 2016-05-30 (×2): 81 mg via ORAL
  Filled 2016-05-29 (×2): qty 1

## 2016-05-29 MED ORDER — OXYCODONE HCL 5 MG PO TABS
ORAL_TABLET | ORAL | Status: AC
  Filled 2016-05-29: qty 1

## 2016-05-29 MED ORDER — GLYCOPYRROLATE 0.2 MG/ML IJ SOLN
INTRAMUSCULAR | Status: DC | PRN
  Administered 2016-05-29 (×2): 0.1 mg via INTRAVENOUS

## 2016-05-29 MED ORDER — METHOCARBAMOL 1000 MG/10ML IJ SOLN
500.0000 mg | Freq: Four times a day (QID) | INTRAVENOUS | Status: DC | PRN
  Filled 2016-05-29: qty 5

## 2016-05-29 MED ORDER — KETAMINE HCL-SODIUM CHLORIDE 100-0.9 MG/10ML-% IV SOSY
PREFILLED_SYRINGE | INTRAVENOUS | Status: AC
  Filled 2016-05-29: qty 10

## 2016-05-29 MED ORDER — SUCCINYLCHOLINE CHLORIDE 20 MG/ML IJ SOLN
INTRAMUSCULAR | Status: DC | PRN
  Administered 2016-05-29: 70 mg via INTRAVENOUS

## 2016-05-29 MED ORDER — CEFAZOLIN SODIUM-DEXTROSE 2-4 GM/100ML-% IV SOLN
2.0000 g | Freq: Four times a day (QID) | INTRAVENOUS | Status: AC
  Administered 2016-05-29 – 2016-05-30 (×3): 2 g via INTRAVENOUS
  Filled 2016-05-29 (×3): qty 100

## 2016-05-29 MED ORDER — PHENYLEPHRINE HCL 10 MG/ML IJ SOLN
INTRAMUSCULAR | Status: DC | PRN
  Administered 2016-05-29: 160 ug via INTRAVENOUS

## 2016-05-29 MED ORDER — MIDAZOLAM HCL 2 MG/2ML IJ SOLN
INTRAMUSCULAR | Status: AC
  Administered 2016-05-29: 1 mg
  Filled 2016-05-29: qty 2

## 2016-05-29 MED ORDER — DOCUSATE SODIUM 100 MG PO CAPS
100.0000 mg | ORAL_CAPSULE | Freq: Two times a day (BID) | ORAL | Status: DC
  Administered 2016-05-29 – 2016-05-30 (×2): 100 mg via ORAL
  Filled 2016-05-29 (×2): qty 1

## 2016-05-29 MED ORDER — PROPOFOL 10 MG/ML IV BOLUS
INTRAVENOUS | Status: DC | PRN
  Administered 2016-05-29: 20 mg via INTRAVENOUS
  Administered 2016-05-29: 100 mg via INTRAVENOUS

## 2016-05-29 MED ORDER — ACETAMINOPHEN 325 MG PO TABS: 650.0000 mg | ORAL_TABLET | Freq: Four times a day (QID) | ORAL | Status: DC | PRN

## 2016-05-29 MED ORDER — LIDOCAINE HCL (CARDIAC) 20 MG/ML IV SOLN
INTRAVENOUS | Status: DC | PRN
  Administered 2016-05-29: 40 mg via INTRATRACHEAL

## 2016-05-29 MED ORDER — ALBUTEROL SULFATE HFA 108 (90 BASE) MCG/ACT IN AERS
INHALATION_SPRAY | RESPIRATORY_TRACT | Status: DC | PRN
  Administered 2016-05-29: 2 via RESPIRATORY_TRACT

## 2016-05-29 MED ORDER — PHENYLEPHRINE 40 MCG/ML (10ML) SYRINGE FOR IV PUSH (FOR BLOOD PRESSURE SUPPORT)
PREFILLED_SYRINGE | INTRAVENOUS | Status: AC
  Filled 2016-05-29: qty 10

## 2016-05-29 MED ORDER — BISACODYL 5 MG PO TBEC: 5.0000 mg | DELAYED_RELEASE_TABLET | Freq: Every day | ORAL | Status: DC | PRN

## 2016-05-29 MED ORDER — METOCLOPRAMIDE HCL 5 MG PO TABS: 5.0000 mg | ORAL_TABLET | Freq: Three times a day (TID) | ORAL | Status: DC | PRN

## 2016-05-29 MED ORDER — HYDROMORPHONE HCL 1 MG/ML IJ SOLN: 0.2500 mg | INTRAMUSCULAR | Status: DC | PRN

## 2016-05-29 MED ORDER — PANTOPRAZOLE SODIUM 40 MG PO TBEC
40.0000 mg | DELAYED_RELEASE_TABLET | Freq: Every day | ORAL | Status: DC
  Administered 2016-05-30: 40 mg via ORAL
  Filled 2016-05-29: qty 1

## 2016-05-29 MED ORDER — COLESTIPOL HCL 1 G PO TABS
1.0000 g | ORAL_TABLET | Freq: Every evening | ORAL | Status: DC
  Administered 2016-05-29: 1 g via ORAL
  Filled 2016-05-29: qty 1

## 2016-05-29 MED ORDER — AMLODIPINE BESYLATE 10 MG PO TABS
10.0000 mg | ORAL_TABLET | Freq: Every day | ORAL | Status: DC
  Filled 2016-05-29: qty 1

## 2016-05-29 MED ORDER — CHLORHEXIDINE GLUCONATE 4 % EX LIQD: 60.0000 mL | Freq: Once | CUTANEOUS | Status: DC

## 2016-05-29 MED ORDER — PHENOL 1.4 % MT LIQD: 1.0000 | OROMUCOSAL | Status: DC | PRN

## 2016-05-29 MED ORDER — GABAPENTIN 300 MG PO CAPS
300.0000 mg | ORAL_CAPSULE | Freq: Every day | ORAL | Status: DC
  Administered 2016-05-29: 300 mg via ORAL
  Filled 2016-05-29 (×2): qty 1

## 2016-05-29 MED ORDER — BUPIVACAINE-EPINEPHRINE (PF) 0.5% -1:200000 IJ SOLN
INTRAMUSCULAR | Status: DC | PRN
  Administered 2016-05-29: 25 mL via PERINEURAL

## 2016-05-29 MED ORDER — FENTANYL CITRATE (PF) 100 MCG/2ML IJ SOLN
INTRAMUSCULAR | Status: AC
  Filled 2016-05-29: qty 2

## 2016-05-29 MED ORDER — FENTANYL CITRATE (PF) 100 MCG/2ML IJ SOLN
INTRAMUSCULAR | Status: AC
  Administered 2016-05-29: 100 ug
  Filled 2016-05-29: qty 2

## 2016-05-29 MED ORDER — SODIUM CHLORIDE 0.9 % IR SOLN
Status: DC | PRN
  Administered 2016-05-29: 1000 mL

## 2016-05-29 MED ORDER — ALUM & MAG HYDROXIDE-SIMETH 200-200-20 MG/5ML PO SUSP: 30.0000 mL | ORAL | Status: DC | PRN

## 2016-05-29 MED ORDER — OXYCODONE HCL 5 MG PO TABS
5.0000 mg | ORAL_TABLET | ORAL | Status: DC | PRN
  Administered 2016-05-29 – 2016-05-30 (×4): 5 mg via ORAL
  Filled 2016-05-29 (×3): qty 1

## 2016-05-29 MED ORDER — DEXAMETHASONE SODIUM PHOSPHATE 10 MG/ML IJ SOLN
INTRAMUSCULAR | Status: DC | PRN
  Administered 2016-05-29: 10 mg via INTRAVENOUS

## 2016-05-29 MED ORDER — EPHEDRINE 5 MG/ML INJ
INTRAVENOUS | Status: AC
  Filled 2016-05-29: qty 10

## 2016-05-29 MED ORDER — KETOROLAC TROMETHAMINE 15 MG/ML IJ SOLN
7.5000 mg | Freq: Four times a day (QID) | INTRAMUSCULAR | Status: DC
  Administered 2016-05-29 – 2016-05-30 (×3): 7.5 mg via INTRAVENOUS
  Filled 2016-05-29 (×2): qty 1

## 2016-05-29 MED ORDER — ONDANSETRON HCL 4 MG/2ML IJ SOLN
INTRAMUSCULAR | Status: AC
  Filled 2016-05-29: qty 2

## 2016-05-29 MED ORDER — LIDOCAINE 2% (20 MG/ML) 5 ML SYRINGE
INTRAMUSCULAR | Status: AC
  Filled 2016-05-29: qty 5

## 2016-05-29 MED ORDER — FENTANYL CITRATE (PF) 100 MCG/2ML IJ SOLN
100.0000 ug | Freq: Once | INTRAMUSCULAR | Status: AC
  Administered 2016-05-29: 100 ug via INTRAVENOUS

## 2016-05-29 MED ORDER — PROPOFOL 10 MG/ML IV BOLUS
INTRAVENOUS | Status: AC
  Filled 2016-05-29: qty 20

## 2016-05-29 MED ORDER — ARTIFICIAL TEARS OP OINT
TOPICAL_OINTMENT | OPHTHALMIC | Status: AC
  Filled 2016-05-29: qty 3.5

## 2016-05-29 MED ORDER — ATORVASTATIN CALCIUM 40 MG PO TABS
40.0000 mg | ORAL_TABLET | Freq: Every evening | ORAL | Status: DC
  Administered 2016-05-29: 40 mg via ORAL
  Filled 2016-05-29: qty 1

## 2016-05-29 MED ORDER — KETOROLAC TROMETHAMINE 15 MG/ML IJ SOLN
INTRAMUSCULAR | Status: AC
  Filled 2016-05-29: qty 1

## 2016-05-29 MED ORDER — COLESTIPOL HCL 1 G PO TABS
2.0000 g | ORAL_TABLET | Freq: Every day | ORAL | Status: DC
  Filled 2016-05-29: qty 2

## 2016-05-29 MED ORDER — PROMETHAZINE HCL 25 MG/ML IJ SOLN
6.2500 mg | INTRAMUSCULAR | Status: DC | PRN
Start: 2016-05-29 — End: 2016-05-29

## 2016-05-29 MED ORDER — FUROSEMIDE 20 MG PO TABS
20.0000 mg | ORAL_TABLET | Freq: Every evening | ORAL | Status: DC
  Administered 2016-05-29: 20 mg via ORAL
  Filled 2016-05-29: qty 1

## 2016-05-29 MED ORDER — SERTRALINE HCL 50 MG PO TABS
50.0000 mg | ORAL_TABLET | Freq: Every day | ORAL | Status: DC
  Administered 2016-05-29 – 2016-05-30 (×2): 50 mg via ORAL
  Filled 2016-05-29 (×2): qty 1

## 2016-05-29 MED ORDER — LACTATED RINGERS IV SOLN
INTRAVENOUS | Status: DC
Start: 2016-05-29 — End: 2016-05-30

## 2016-05-29 MED ORDER — ONDANSETRON HCL 4 MG PO TABS: 4.0000 mg | ORAL_TABLET | Freq: Four times a day (QID) | ORAL | Status: DC | PRN

## 2016-05-29 MED ORDER — ALBUTEROL SULFATE HFA 108 (90 BASE) MCG/ACT IN AERS
INHALATION_SPRAY | RESPIRATORY_TRACT | Status: AC
  Filled 2016-05-29: qty 6.7

## 2016-05-29 MED ORDER — MENTHOL 3 MG MT LOZG: 1.0000 | LOZENGE | OROMUCOSAL | Status: DC | PRN

## 2016-05-29 MED ORDER — GLYCOPYRROLATE 0.2 MG/ML IV SOSY
PREFILLED_SYRINGE | INTRAVENOUS | Status: AC
  Filled 2016-05-29: qty 3

## 2016-05-29 MED ORDER — POTASSIUM CHLORIDE CRYS ER 20 MEQ PO TBCR
20.0000 meq | EXTENDED_RELEASE_TABLET | Freq: Every day | ORAL | Status: DC
  Administered 2016-05-29 – 2016-05-30 (×2): 20 meq via ORAL
  Filled 2016-05-29 (×2): qty 1

## 2016-05-29 MED ORDER — HYDROMORPHONE HCL 2 MG/ML IJ SOLN: 1.0000 mg | INTRAMUSCULAR | Status: DC | PRN

## 2016-05-29 MED ORDER — ONDANSETRON HCL 4 MG/2ML IJ SOLN: 4.0000 mg | Freq: Four times a day (QID) | INTRAMUSCULAR | Status: DC | PRN

## 2016-05-29 MED ORDER — GABAPENTIN 300 MG PO CAPS
600.0000 mg | ORAL_CAPSULE | Freq: Every day | ORAL | Status: DC
  Administered 2016-05-30: 600 mg via ORAL
  Filled 2016-05-29: qty 2

## 2016-05-29 MED ORDER — POLYETHYLENE GLYCOL 3350 17 G PO PACK: 17.0000 g | PACK | Freq: Every day | ORAL | Status: DC | PRN

## 2016-05-29 MED ORDER — ACETAMINOPHEN 650 MG RE SUPP: 650.0000 mg | Freq: Four times a day (QID) | RECTAL | Status: DC | PRN

## 2016-05-29 MED ORDER — MIDAZOLAM HCL 2 MG/2ML IJ SOLN
INTRAMUSCULAR | Status: AC
  Filled 2016-05-29: qty 2

## 2016-05-29 MED ORDER — LACTATED RINGERS IV SOLN
INTRAVENOUS | Status: DC
  Administered 2016-05-29 (×2): via INTRAVENOUS

## 2016-05-29 MED ORDER — TERAZOSIN HCL 5 MG PO CAPS
5.0000 mg | ORAL_CAPSULE | Freq: Every evening | ORAL | Status: DC
  Administered 2016-05-29: 5 mg via ORAL
  Filled 2016-05-29: qty 1

## 2016-05-29 MED ORDER — PHENYLEPHRINE HCL 10 MG/ML IJ SOLN
INTRAVENOUS | Status: DC | PRN
  Administered 2016-05-29: 50 ug/min via INTRAVENOUS

## 2016-05-29 MED ORDER — MIDAZOLAM HCL 2 MG/2ML IJ SOLN: 2.0000 mg | Freq: Once | INTRAMUSCULAR | Status: DC

## 2016-05-29 MED ORDER — METHOCARBAMOL 500 MG PO TABS
500.0000 mg | ORAL_TABLET | Freq: Four times a day (QID) | ORAL | Status: DC | PRN
  Administered 2016-05-30: 500 mg via ORAL
  Filled 2016-05-29: qty 1

## 2016-05-29 MED ORDER — KETAMINE HCL 10 MG/ML IJ SOLN
INTRAMUSCULAR | Status: DC | PRN
  Administered 2016-05-29: 20 mg via INTRAVENOUS

## 2016-05-29 MED ORDER — SUCCINYLCHOLINE CHLORIDE 200 MG/10ML IV SOSY
PREFILLED_SYRINGE | INTRAVENOUS | Status: AC
  Filled 2016-05-29: qty 10

## 2016-05-29 MED ORDER — EPHEDRINE SULFATE 50 MG/ML IJ SOLN
INTRAMUSCULAR | Status: DC | PRN
  Administered 2016-05-29 (×2): 25 mg via INTRAVENOUS

## 2016-05-29 MED ORDER — MAGNESIUM CITRATE PO SOLN: 1.0000 | Freq: Once | ORAL | Status: DC | PRN

## 2016-05-29 SURGICAL SUPPLY — 66 items
BASEPLATE GLENOID SHLDR SM (Shoulder) ×2 IMPLANT
BLADE SAW SGTL 83.5X18.5 (BLADE) ×2 IMPLANT
COVER SURGICAL LIGHT HANDLE (MISCELLANEOUS) ×2 IMPLANT
CUP SUT UNIV REVERS 42 +2 RT (Cup) ×2 IMPLANT
DERMABOND ADVANCED (GAUZE/BANDAGES/DRESSINGS) ×1
DERMABOND ADVANCED .7 DNX12 (GAUZE/BANDAGES/DRESSINGS) ×1 IMPLANT
DRAPE ORTHO SPLIT 77X108 STRL (DRAPES) ×2
DRAPE SURG 17X11 SM STRL (DRAPES) ×2 IMPLANT
DRAPE SURG ORHT 6 SPLT 77X108 (DRAPES) ×2 IMPLANT
DRAPE U-SHAPE 47X51 STRL (DRAPES) ×2 IMPLANT
DRSG AQUACEL AG ADV 3.5X 6 (GAUZE/BANDAGES/DRESSINGS) ×2 IMPLANT
DRSG AQUACEL AG ADV 3.5X10 (GAUZE/BANDAGES/DRESSINGS) ×2 IMPLANT
DURAPREP 26ML APPLICATOR (WOUND CARE) ×2 IMPLANT
ELECT BLADE 4.0 EZ CLEAN MEGAD (MISCELLANEOUS) ×2
ELECT CAUTERY BLADE 6.4 (BLADE) ×2 IMPLANT
ELECT REM PT RETURN 9FT ADLT (ELECTROSURGICAL) ×2
ELECTRODE BLDE 4.0 EZ CLN MEGD (MISCELLANEOUS) ×1 IMPLANT
ELECTRODE REM PT RTRN 9FT ADLT (ELECTROSURGICAL) ×1 IMPLANT
FACESHIELD WRAPAROUND (MASK) ×6 IMPLANT
GLENOSPHERE UNI REV 42+4 LAT (Shoulder) ×2 IMPLANT
GLOVE BIO SURGEON STRL SZ7.5 (GLOVE) ×2 IMPLANT
GLOVE BIO SURGEON STRL SZ8 (GLOVE) ×2 IMPLANT
GLOVE EUDERMIC 7 POWDERFREE (GLOVE) ×2 IMPLANT
GLOVE SS BIOGEL STRL SZ 7.5 (GLOVE) ×1 IMPLANT
GLOVE SUPERSENSE BIOGEL SZ 7.5 (GLOVE) ×1
GOWN STRL REUS W/ TWL LRG LVL3 (GOWN DISPOSABLE) IMPLANT
GOWN STRL REUS W/ TWL XL LVL3 (GOWN DISPOSABLE) ×2 IMPLANT
GOWN STRL REUS W/TWL LRG LVL3 (GOWN DISPOSABLE)
GOWN STRL REUS W/TWL XL LVL3 (GOWN DISPOSABLE) ×2
INSERT HUMERAL L/42+3 IN 42CUP (Shoulder) ×2 IMPLANT
KIT BASIN OR (CUSTOM PROCEDURE TRAY) ×2 IMPLANT
KIT ROOM TURNOVER OR (KITS) ×2 IMPLANT
MANIFOLD NEPTUNE II (INSTRUMENTS) ×2 IMPLANT
NDL SUT .5 MAYO 1.404X.05X (NEEDLE) ×1 IMPLANT
NEEDLE HYPO 25GX1X1/2 BEV (NEEDLE) IMPLANT
NEEDLE MAYO TAPER (NEEDLE) ×1
NS IRRIG 1000ML POUR BTL (IV SOLUTION) ×2 IMPLANT
PACK SHOULDER (CUSTOM PROCEDURE TRAY) ×2 IMPLANT
PAD ARMBOARD 7.5X6 YLW CONV (MISCELLANEOUS) ×4 IMPLANT
PASSER SUT SWANSON 36MM LOOP (INSTRUMENTS) IMPLANT
RESTRAINT HEAD UNIVERSAL NS (MISCELLANEOUS) ×2 IMPLANT
SCREW CENTRAL NONLOCK 6.5X20MM (Shoulder) ×2 IMPLANT
SCREW LOCK GLENOID UNI PERI (Screw) ×2 IMPLANT
SCREW LOCK PERIPHERAL 30MM (Shoulder) ×2 IMPLANT
SET PIN UNIVERSAL REVERSE (SET/KITS/TRAYS/PACK) ×2 IMPLANT
SLING ARM FOAM STRAP LRG (SOFTGOODS) IMPLANT
SLING ARM IMMOBILIZER LRG (SOFTGOODS) ×2 IMPLANT
SPACER SHLD UNI REV 42 +6 (Spacer) ×2 IMPLANT
SPONGE LAP 18X18 X RAY DECT (DISPOSABLE) ×2 IMPLANT
SPONGE LAP 4X18 X RAY DECT (DISPOSABLE) ×2 IMPLANT
STEM HUMERAL UNI REV CAP SZ13 (Stem) ×2 IMPLANT
SUCTION FRAZIER HANDLE 10FR (MISCELLANEOUS) ×1
SUCTION TUBE FRAZIER 10FR DISP (MISCELLANEOUS) ×1 IMPLANT
SUT BONE WAX W31G (SUTURE) IMPLANT
SUT FIBERWIRE #2 38 T-5 BLUE (SUTURE) ×4
SUT MNCRL AB 3-0 PS2 18 (SUTURE) ×2 IMPLANT
SUT MON AB 2-0 CT1 36 (SUTURE) ×2 IMPLANT
SUT VIC AB 1 CT1 27 (SUTURE) ×1
SUT VIC AB 1 CT1 27XBRD ANBCTR (SUTURE) ×1 IMPLANT
SUT VIC AB 2-0 CT1 27 (SUTURE)
SUT VIC AB 2-0 CT1 TAPERPNT 27 (SUTURE) IMPLANT
SUTURE FIBERWR #2 38 T-5 BLUE (SUTURE) ×2 IMPLANT
SYR CONTROL 10ML LL (SYRINGE) IMPLANT
TOWEL OR 17X24 6PK STRL BLUE (TOWEL DISPOSABLE) ×2 IMPLANT
TOWEL OR 17X26 10 PK STRL BLUE (TOWEL DISPOSABLE) ×2 IMPLANT
WATER STERILE IRR 1000ML POUR (IV SOLUTION) IMPLANT

## 2016-05-29 NOTE — Op Note (Signed)
05/29/2016  12:16 PM  PATIENT:   Jeffery [name]  80 y.o. male  PRE-OPERATIVE DIAGNOSIS:  RIGHT SHOULDER ROTATOR CUFF ARTHROPATHY  POST-OPERATIVE DIAGNOSIS:  same  PROCEDURE:  R reverse shoulder arthroplasty #13 stem, +6 spacer, +3 poly, 42/+4 gelnopsphere  SURGEON:  Emberli Ballester, Vania ReaKevin M. M.D.  ASSISTANTS: Shuford pac   ANESTHESIA:   GET + ISB  EBL: 200  SPECIMEN:  none  Drains: none   PATIENT DISPOSITION:  PACU - hemodynamically stable.    PLAN OF CARE: Admit for overnight observation  Dictation# ???   Contact # [phone]

## 2016-05-29 NOTE — Anesthesia Procedure Notes (Signed)
Procedure Name: Intubation Date/Time: 05/29/2016 10:35 AM Performed by: Sharee HolsterMASSAGEE, TERRY Pre-anesthesia Checklist: Patient identified, Emergency Drugs available, Suction available and Patient being monitored Patient Re-evaluated:Patient Re-evaluated prior to inductionOxygen Delivery Method: Circle system utilized Preoxygenation: Pre-oxygenation with 100% oxygen Intubation Type: IV induction Laryngoscope Size: Glidescope (T4) Tube type: Oral Tube size: 7.0 mm Airway Equipment and Method: Video-laryngoscopy Placement Confirmation: ETT inserted through vocal cords under direct vision,  positive ETCO2 and breath sounds checked- equal and bilateral Secured at: 23 cm Dental Injury: Teeth and Oropharynx as per pre-operative assessment  Difficulty Due To: Difficulty was anticipated

## 2016-05-29 NOTE — Anesthesia Procedure Notes (Addendum)
Anesthesia Regional Block:  Interscalene brachial plexus block  Pre-Anesthetic Checklist: ,, timeout performed, Correct Patient, Correct Site, Correct Laterality, Correct Procedure, Correct Position, site marked, Risks and benefits discussed,  Surgical consent,  Pre-op evaluation,  At surgeon's request and post-op pain management  Laterality: Upper and Right  Prep: Betadine, chloraprep       Needles:  Injection technique: Single-shot  Needle Type: Echogenic Needle     Needle Length: 9cm 9 cm Needle Gauge: 21 and 21 G  Needle insertion depth: 4 cm   Additional Needles:  Procedures: ultrasound guided (picture in chart) and nerve stimulator Interscalene brachial plexus block  Nerve Stimulator or Paresthesia:  Response: Twitch elicited, 0.5 mA, 0.3 ms,   Additional Responses:   Narrative:  Start time: 05/29/2016 9:05 AM End time: 05/29/2016 9:22 AM Injection made incrementally with aspirations every 5 mL.  Performed by: Personally  Anesthesiologist: Jaisen Wiltrout  Additional Notes: Block assessed prior to start of surgery

## 2016-05-29 NOTE — Transfer of Care (Signed)
Immediate Anesthesia Transfer of Care Note  Patient: Minna MerrittsGerard Mackins  Procedure(s) Performed: Procedure(s): REVERSE SHOULDER ARTHROPLASTY (Right)  Patient Location: PACU  Anesthesia Type:General  Level of Consciousness: awake, alert  and patient cooperative  Airway & Oxygen Therapy: Patient Spontanous Breathing and Patient connected to face mask oxygen  Post-op Assessment: Report given to RN, Post -op Vital signs reviewed and stable, Patient moving all extremities X 4 and Patient able to stick tongue midline  Post vital signs: Reviewed and stable  Last Vitals:  Vitals:   05/29/16 0849  BP: (!) 150/96  Pulse: (!) 55  Resp: 20  Temp: 37 C    Last Pain:  Vitals:   05/29/16 0849  TempSrc: Oral      Patients Stated Pain Goal: 2 (05/29/16 0836)  Complications: No apparent anesthesia complications

## 2016-05-29 NOTE — Anesthesia Preprocedure Evaluation (Addendum)
Anesthesia Evaluation  Patient identified by MRN, date of birth, ID band Patient awake    Reviewed: Allergy & Precautions, NPO status , Patient's Chart, lab work & pertinent test results  History of Anesthesia Complications Negative for: history of anesthetic complications  Airway Mallampati: III  TM Distance: <3 FB     Dental  (+) Teeth Intact, Caps, Dental Advisory Given   Pulmonary shortness of breath, sleep apnea , former smoker,    breath sounds clear to auscultation       Cardiovascular hypertension, + CAD   Rhythm:Regular Rate:Normal  Mild CAD   Neuro/Psych    GI/Hepatic GERD  ,  Endo/Other    Renal/GU      Musculoskeletal  (+) Arthritis ,   Abdominal (+) + obese,   Peds  Hematology   Anesthesia Other Findings   Reproductive/Obstetrics                            Anesthesia Physical Anesthesia Plan  ASA: II  Anesthesia Plan: General   Post-op Pain Management:    Induction: Intravenous  Airway Management Planned: Video Laryngoscope Planned and Oral ETT  Additional Equipment:   Intra-op Plan:   Post-operative Plan: Extubation in OR  Informed Consent: I have reviewed the patients History and Physical, chart, labs and discussed the procedure including the risks, benefits and alternatives for the proposed anesthesia with the patient or authorized representative who has indicated his/her understanding and acceptance.     Plan Discussed with:   Anesthesia Plan Comments:         Anesthesia Quick Evaluation

## 2016-05-29 NOTE — Progress Notes (Signed)
Report given to erika rn as caregiver 

## 2016-05-29 NOTE — Anesthesia Postprocedure Evaluation (Signed)
Anesthesia Post Note  Patient: Jeffery [name]  Procedure(s) Performed: Procedure(s) (LRB): REVERSE SHOULDER ARTHROPLASTY (Right)  Patient location during evaluation: PACU Anesthesia Type: General and Regional Level of consciousness: awake and alert Pain management: pain level controlled Vital Signs Assessment: post-procedure vital signs reviewed and stable Respiratory status: spontaneous breathing, nonlabored ventilation, respiratory function stable and patient connected to nasal cannula oxygen Cardiovascular status: blood pressure returned to baseline and stable Postop Assessment: no signs of nausea or vomiting Anesthetic complications: no    Last Vitals:  Vitals:   05/29/16 1330 05/29/16 1345  BP: 114/88 128/81  Pulse: (!) 57 69  Resp: 12 14  Temp:  36.6 C    Last Pain:  Vitals:   05/29/16 1330  TempSrc:   PainSc: 0-No pain                 Artha Stavros,JAMES TERRILL

## 2016-05-29 NOTE — Discharge Instructions (Signed)

## 2016-05-29 NOTE — H&P (Signed)
Jeffery MerrittsGerard Eichler    Chief Complaint: RIGHT SHOULDER ROTATOR CUFF ARTHROPATHY HPI: The patient is a 80 y.o. male with end stage right shoulder rotator cuff tear arthropathy  Past Medical History:  Diagnosis Date  . Anxiety   . BPH (benign prostatic hyperplasia)   . Depression   . Dyspnea   . GERD (gastroesophageal reflux disease)   . Glaucoma   . Hard of hearing   . History of pneumonia   . Hyperlipidemia   . Hypertension   . Memory impairment   . Nocturia   . Osteoarthritis   . Peripheral neuropathy (HCC)   . Sleep apnea    wears CPAP  . Urinary frequency   . Urinary incontinence   . Wears glasses     Past Surgical History:  Procedure Laterality Date  . CARDIAC CATHETERIZATION    . CIRCUMCISION    . COLONOSCOPY    . ESOPHAGOGASTRODUODENOSCOPY    . EYE SURGERY Bilateral    cataract  . HAND SURGERY Right   . HIP ARTHROPLASTY Right   . HIP ARTHROPLASTY Left 2006  . KNEE CARTILAGE SURGERY Bilateral   . LUMBAR LAMINECTOMY     L3 - L4  . RETINAL DETACHMENT SURGERY Right    x2  . SHOULDER SURGERY Bilateral     History reviewed. No pertinent family history.  Social History:  reports that he has quit smoking. His smoking use included Pipe. He has never used smokeless tobacco. He reports that he drinks alcohol. He reports that he does not use drugs.   Medications Prior to Admission  Medication Sig Dispense Refill  . amLODipine (NORVASC) 10 MG tablet Take 10 mg by mouth daily.    Marland Kitchen. atorvastatin (LIPITOR) 40 MG tablet Take 40 mg by mouth every evening.    . Cholecalciferol (VITAMIN D) 2000 units CAPS Take 2,000 Units by mouth daily.    . colestipol (COLESTID) 1 g tablet Take 1-2 g by mouth 2 (two) times daily. 2 g in the morning and 1 g in the evening (2 hours before or after other medications)    . furosemide (LASIX) 20 MG tablet Take 20 mg by mouth every evening.    . gabapentin (NEURONTIN) 300 MG capsule Take 300-600 mg by mouth 2 (two) times daily. 600 mg in the  morning and 300 mg in the evening    . HYDROcodone-acetaminophen (NORCO/VICODIN) 5-325 MG tablet Take 1 tablet by mouth every 6 (six) hours as needed for moderate pain.    Marland Kitchen. latanoprost (XALATAN) 0.005 % ophthalmic solution Place 1 drop into both eyes at bedtime.  1  . meloxicam (MOBIC) 7.5 MG tablet Take 7.5 mg by mouth every evening.    Marland Kitchen. omeprazole (PRILOSEC) 20 MG capsule Take 20 mg by mouth 2 (two) times daily.    . potassium chloride SA (K-DUR,KLOR-CON) 20 MEQ tablet Take 20 mEq by mouth daily. With Vitamin d3    . PRESCRIPTION MEDICATION Inhale 1 puff into the lungs at bedtime. Unknown inhaler (patient states he will bring with him to his upcoming pre operative appointment)    . Probiotic Product (PROBIOTIC PO) Take 1 capsule by mouth daily.    . sertraline (ZOLOFT) 50 MG tablet Take 50 mg by mouth daily.    Marland Kitchen. terazosin (HYTRIN) 5 MG capsule Take 5 mg by mouth every evening.    . timolol (TIMOPTIC) 0.5 % ophthalmic solution Place 1 drop into both eyes 2 (two) times daily.  0  . aspirin 81 MG tablet  Take 81 mg by mouth 2 (two) times daily.       Physical Exam: right shoulder with painful and restricted motion as noted at recent office visits  Vitals  Temp:  [98.6 F (37 C)] 98.6 F (37 C) (11/30 0849) Pulse Rate:  [55] 55 (11/30 0849) Resp:  [20] 20 (11/30 0849) BP: (150)/(96) 150/96 (11/30 0849) SpO2:  [95 %] 95 % (11/30 0849) Weight:  [124.7 kg (275 lb)] 124.7 kg (275 lb) (11/30 0836)  Assessment/Plan  Impression: RIGHT SHOULDER ROTATOR CUFF ARTHROPATHY  Plan of Action: Procedure(s): REVERSE SHOULDER ARTHROPLASTY  Rondo Spittler M Christan Ciccarelli 05/29/2016, 9:27 AM Contact # [phone]

## 2016-05-30 MED ORDER — ONDANSETRON HCL 4 MG PO TABS: 4.0000 mg | ORAL_TABLET | Freq: Three times a day (TID) | ORAL | 0 refills | Status: DC | PRN

## 2016-05-30 MED ORDER — OXYCODONE-ACETAMINOPHEN 5-325 MG PO TABS: 1.0000 | ORAL_TABLET | ORAL | 0 refills | Status: DC | PRN

## 2016-05-30 NOTE — Care Management Note (Signed)
Case Management Note  Patient Details  Name: Jeffery [name] MRN: [id number] Date of Birth: Mar 22, 1936  Subjective/Objective:    80 yr old male s/p right shoulder rotator arthroplasty.                 Action/Plan: Case manager spoke with patient and wife concerning discharge plan. Choice was offered for Home Health agency. Patient's wife stated that they have worked with [facility] in the past and want to do so now. They are requesting therapist by name of Silas FloodJennifer Thomas. CM called referral to Janeice RobinsonKaren Nusbaumm, [facility] Liaison.    Expected Discharge Date:   05/30/16               Expected Discharge Plan:  Home w Home Health Services  In-House Referral:  NA  Discharge planning Services  CM Consult  Post Acute Care Choice:  Home Health Choice offered to:  Spouse, Patient  DME Arranged:    DME Agency:  NA  HH Arranged:  OT, PT HH Agency:  [facility]  Status of Service:  Completed, signed off  If discussed at Long Length of Stay Meetings, dates discussed:    Additional Comments:  Durenda GuthrieBrady, Kaitlyn Skowron Naomi, RN 05/30/2016, 12:48 PM

## 2016-05-30 NOTE — Progress Notes (Signed)
PT Cancellation Note  Patient Details Name: Jeffery [name] MRN: [id number] DOB: January 19, 1936   Cancelled Treatment:    Reason Eval/Treat Not Completed: PT screened, no needs identified, will sign off Pt seen by OT and reports no PT needs at this time.  PT to sign off.   Jalayna Josten,KATHrine E 05/30/2016, 10:33 AM Zenovia JarredKati Senia Even, PT, DPT 05/30/2016 Pager: [phone]

## 2016-05-30 NOTE — Op Note (Signed)
NAMMinna [name]:  Jeffery, [name]               ACCOUNT NO.:  [id number]  MEDICAL RECORD NO.:  [id number]  LOCATION:                                 FACILITY:  PHYSICIAN:  Vania ReaKevin M. Brailen Macneal, M.D.  DATE OF BIRTH:  12-17-1935  DATE OF PROCEDURE:  05/29/2016 DATE OF DISCHARGE:                              OPERATIVE REPORT   PREOPERATIVE DIAGNOSIS:  End-stage right shoulder rotator cuff tear arthropathy.  POSTOPERATIVE DIAGNOSIS:  End-stage right shoulder rotator cuff tear arthropathy.  PROCEDURE:  Right reverse shoulder arthroplasty utilizing a press-fit size 13 Arthrex stem with a +6 spacer, +3 poly, and a 42+ 4 glenosphere on a small baseplate.  SURGEON:  Vania ReaKevin M. Keely Drennan, M.D.  Threasa HeadsASSISTANFrench Ana:  Tracy A. Shuford, P.A.-C.  ANESTHESIA:  General endotracheal as well as an interscalene block.  ESTIMATED BLOOD LOSS:  200 mL.  DRAINS:  None.  HISTORY:  Jeffery [name] is a 80 year old gentleman, who has had chronic and progressively increasing right shoulder pain related to rotator cuff tear arthropathy.  His symptoms have now deteriorated to the point where they are having significant impact on quality of life and ability to perform activities of daily living.  He was brought to the operating room at this time for planned right reverse shoulder arthroplasty.  Preoperatively, I counseled Jeffery [name] regarding treatment options and potential risks versus benefits thereof.  Possible surgical complications were all reviewed including bleeding, infection, neurovascular injury, persistent pain, loss of motion, anesthetic complication, failure of the implant and possible need for additional surgery.  He understands and accepts and agrees with our planned procedure.  PROCEDURE IN DETAIL:  After undergoing routine preop evaluation, the patient received prophylactic antibiotics.  An interscalene block was established in the holding area with the Anesthesia Department.  Placed supine on the operating table,  underwent smooth induction of general endotracheal anesthesia.  Placed in beach chair position and appropriately padded and protected.  Right shoulder girdle region was then sterilely prepped and draped in standard fashion.  Time-out was called.  An anterior deltopectoral approach to the right shoulder was made through an approximately 8 cm incision.  Skin flaps were elevated. The dissection was carried deeply.  Electrocautery was used for hemostasis.  The deltopectoral interval was then developed bluntly with the vein taken laterally.  __________ centimeter of the pec major was tenotomized to enhance exposure.  Adhesions divided beneath the deltoid. The conjoined tendon mobilized and retracted medially.  We then divided the long head of the biceps tendon for tenodesis and also separated the subscapularis away from the lesser tuberosity using electrocautery and tagged the margin with pair of FiberWire grasping sutures.  We then divided the capsule attachments from the anterior-inferior and inferior aspect of the humeral head allowing delivery of the head completely through the wound.  We outlined our proposed humeral head resection with the extramedullary guide and completed the humeral head resection with an oscillating saw protecting the posterior remnants of the rotator cuff.  I placed a metal cap over the cut surface of the proximal humerus to protect it as we exposed the glenoid with Baldwin CrownFukuda, pitchfork, and __________ retractors.  I performed a circumferential labral resection, gained complete  exposure of the glenoid and mobilized subscapularis.  A simple guidepin was then placed into the center of the glenoid.  We performed our central reaming followed by the peripheral reamer and placed the size small base plate with excellent fixation centrally with the lag screw and locking screws inferiorly and superiorly.  At this point, we then inserted a 42+ 4 glenosphere onto the base plate,  which was impacted, showed excellent stability and fixation.  We then returned our attention to the proximal humerus, where we broached at approximately 20 degrees of retroversion up to size 13.  We then performed our metaphyseal reaming off our broach and performed a trial reduction, which showed good soft tissue balance.  We then assembled our final size 13 stem to the posterior offset metaphysis.  This was assembled on the back table, then impacted into the humerus with excellent interference fit and fixation.  We then performed a series of trial reductions, ultimately decided that total +9 off the stem was appropriate, so we used a +6 spacer, +3 poly.  We then performed a final reduction, very pleased with the overall soft tissue balance, excellent stability, excellent motion.  Joint was copiously irrigated, hemostasis was obtained.  The subscapularis was then repaired back to the metaphysis of the proximal humerus using the eyelet on our stem.  The long head of the biceps tendon was then tenodesed at the level of the pec major tendon.  The deltopectoral interval was then reapproximated with series of figure-of-eight #1 Vicryl sutures.  2-0 Vicryl used for the subcu layer, intracuticular 3-0 Monocryl for the skin and followed by Dermabond and Aquacel dressing.  The right arm was placed in a sling. The patient was awakened, extubated, and taken recovery room in stable condition.  Ralene Batheracy Shuford, P.A.-C., was used as an Geophysicist/field seismologistassistant throughout this case, was essential for help with positioning the patient, positioning the extremity, management of the tissue retractors, implantation of prosthesis, wound closure, and intraoperative decision making.     Vania ReaKevin M. Samel Bruna, M.D.   ______________________________ Vania ReaKevin M. Rayson Rando, M.D.    KMS/MEDQ  D:  05/29/2016  T:  05/30/2016  Job:  [id number]

## 2016-05-30 NOTE — Evaluation (Signed)
Occupational Therapy Evaluation Patient Details Name: Jeffery [name] MRN: [id number] DOB: Sep 03, 1935 Today's Date: 05/30/2016    History of Present Illness R REVERSE SHOULDER ARTHROPLASTY   Clinical Impression   Pt is ar min A level with ADLs ad sup - mod I with ADL mobility. Pt and his wife educated on ADL techniques, ROM exercises as prescribed by MD, R UE sling protocol and positioning. ALl education completed and no further acute OT indicated at this time    Follow Up Recommendations  Home health OT (progress rehab of R shoulder per MD when appropriate)    Equipment Recommendations  None recommended by OT    Recommendations for Other Services       Precautions / Restrictions Precautions Precautions: Shoulder Shoulder Interventions: Shoulder sling/immobilizer;Off for dressing/bathing/exercises Precaution Booklet Issued: Yes (comment) Precaution Comments: Pt and wife educated on ADL techniques, sling protocol and ROM exercises as prescribed by MD Restrictions Weight Bearing Restrictions: Yes Other Position/Activity Restrictions: NWB      Mobility Bed Mobility Overal bed mobility: Needs Assistance Bed Mobility: Supine to Sit     Supine to sit: Supervision;HOB elevated        Transfers Overall transfer level: Modified independent Equipment used: None Transfers: Sit to/from Stand           General transfer comment: no pyhsical assist from OT, slow pace    Balance Overall balance assessment: No apparent balance deficits (not formally assessed)                                          ADL Overall ADL's : Needs assistance/impaired     Grooming: Wash/dry hands;Wash/dry face;Set up;Supervision/safety;Standing   Upper Body Bathing: Minimal assitance   Lower Body Bathing: Minimal assistance   Upper Body Dressing : Minimal assistance   Lower Body Dressing: Minimal assistance   Toilet Transfer: Supervision/safety;Ambulation;Comfort  height toilet;Modified Independent   Toileting- Clothing Manipulation and Hygiene: Supervision/safety   Tub/ Shower Transfer: Supervision/safety;Grab bars;Ambulation   Functional mobility during ADLs: Modified independent General ADL Comments: Pt and wife educated on ADL techniques and toileing aid     Vision Vision Assessment?: No apparent visual deficits              Pertinent Vitals/Pain Pain Assessment: 0-10 Pain Score: 5  Pain Location: R shoulder Pain Descriptors / Indicators: Aching;Sore Pain Intervention(s): Limited activity within patient's tolerance;Monitored during session;Premedicated before session     Hand Dominance Right   Extremity/Trunk Assessment Upper Extremity Assessment Upper Extremity Assessment: Generalized weakness;RUE deficits/detail RUE Deficits / Details: R UE in sling RUE: Unable to fully assess due to immobilization       Cervical / Trunk Assessment Cervical / Trunk Assessment: Normal   Communication Communication Communication: No difficulties   Cognition Arousal/Alertness: Awake/alert Behavior During Therapy: WFL for tasks assessed/performed Overall Cognitive Status: Within Functional Limits for tasks assessed                     General Comments   pt very pleasant and cooperative, wife very supportive           Shoulder Instructions Shoulder Instructions Donning/doffing shirt without moving shoulder: Minimal assistance;Caregiver independent with task;Patient able to independently direct caregiver Method for sponge bathing under operated UE: Minimal assistance;Caregiver independent with task;Patient able to independently direct caregiver Donning/doffing sling/immobilizer: Minimal assistance;Caregiver independent with task;Patient able to independently direct caregiver  Correct positioning of sling/immobilizer: Supervision/safety;Caregiver independent with task;Patient able to independently direct caregiver ROM for elbow,  wrist and digits of operated UE: Supervision/safety;Caregiver independent with task;Patient able to independently direct caregiver Sling wearing schedule (on at all times/off for ADL's): Supervision/safety;Caregiver independent with task;Patient able to independently direct caregiver Proper positioning of operated UE when showering: Supervision/safety;Caregiver independent with task;Patient able to independently direct caregiver Positioning of UE while sleeping: Supervision/safety;Caregiver independent with task;Patient able to independently direct caregiver    Home Living Family/patient expects to be discharged to:: Private residence Living Arrangements: Spouse/significant other Available Help at Discharge: Family Type of Home: House Home Access: Stairs to enter Secretary/administratorntrance Stairs-Number of Steps: 2   Home Layout: One level     Bathroom Shower/Tub: Producer, television/film/videoWalk-in shower   Bathroom Toilet: Handicapped height     Home Equipment: None;Walker - 2 wheels;Bedside commode;Shower seat;Cane - single point;Adaptive equipment Adaptive Equipment: Reacher;Long-handled shoe horn;Long-handled sponge;Sock aid        Prior Functioning/Environment Level of Independence: Independent with assistive device(s);Needs assistance  Gait / Transfers Assistance Needed: uses cane when out in community              OT Problem List: Impaired UE functional use;Pain;Decreased activity tolerance;Decreased coordination;Decreased range of motion   OT Treatment/Interventions:      OT Goals(Current goals can be found in the care plan section) Acute Rehab OT Goals Patient Stated Goal: go home OT Goal Formulation: With patient  OT Frequency:     Barriers to D/C:    no barriers                     End of Session Equipment Utilized During Treatment: Other (comment) (sling)  Activity Tolerance: Patient tolerated treatment well Patient left: in chair;with family/visitor present;with call bell/phone within  reach   Time: 0931-1039 OT Time Calculation (min): 68 min Charges:  OT General Charges $OT Visit: 1 Procedure OT Evaluation $OT Eval Moderate Complexity: 1 Procedure OT Treatments $Self Care/Home Management : 8-22 mins $Therapeutic Activity: 8-22 mins $Therapeutic Exercise: 8-22 mins G-Codes:    Galen ManilaSpencer, Kyria Bumgardner Jeanette 05/30/2016, 11:32 AM

## 2016-05-30 NOTE — Discharge Summary (Signed)
PATIENT ID:      Jeffery [name]  MRN:     [id number] DOB/AGE:    1936-05-17 / 80 y.o.     DISCHARGE SUMMARY  ADMISSION DATE:    05/29/2016 DISCHARGE DATE:    ADMISSION DIAGNOSIS: RIGHT SHOULDER ROTATOR CUFF ARTHROPATHY Past Medical History:  Diagnosis Date  . Anxiety   . BPH (benign prostatic hyperplasia)   . Depression   . Dyspnea   . GERD (gastroesophageal reflux disease)   . Glaucoma   . Hard of hearing   . History of pneumonia   . Hyperlipidemia   . Hypertension   . Memory impairment   . Nocturia   . Osteoarthritis   . Peripheral neuropathy (HCC)   . Sleep apnea    wears CPAP  . Urinary frequency   . Urinary incontinence   . Wears glasses     DISCHARGE DIAGNOSIS:   Active Problems:   S/P reverse total shoulder arthroplasty, right   PROCEDURE: Procedure(s): REVERSE SHOULDER ARTHROPLASTY on 05/29/2016  CONSULTS:    HISTORY:  See H&P in chart.  HOSPITAL COURSE:  Jeffery [name] is a 80 y.o. admitted on 05/29/2016 with a diagnosis of RIGHT SHOULDER ROTATOR CUFF ARTHROPATHY.  They were brought to the operating room on 05/29/2016 and underwent Procedure(s): REVERSE SHOULDER ARTHROPLASTY.    They were given perioperative antibiotics: Anti-infectives    Start     Dose/Rate Route Frequency Ordered Stop   05/29/16 2000  ceFAZolin (ANCEF) IVPB 2g/100 mL premix     2 g 200 mL/hr over 30 Minutes Intravenous Every 6 hours 05/29/16 1852 05/30/16 1359   05/29/16 0600  ceFAZolin (ANCEF) 3 g in dextrose 5 % 50 mL IVPB     3 g 130 mL/hr over 30 Minutes Intravenous On call to O.R. 05/28/16 1300 05/29/16 1035    .  Patient underwent the above named procedure and tolerated it well. The following day they were hemodynamically stable and pain was controlled on oral analgesics. They were neurovascularly intact to the operative extremity. OT was ordered and worked with patient per protocol. They were medically and orthopaedically stable for discharge on day 1 .    DIAGNOSTIC  STUDIES:  RECENT RADIOGRAPHIC STUDIES :  No results found.  RECENT VITAL SIGNS:  Patient Vitals for the past 24 hrs:  BP Temp Temp src Pulse Resp SpO2 Weight  05/30/16 0742 129/85 98.1 F (36.7 C) Oral 87 16 90 % -  05/30/16 0112 126/84 98.1 F (36.7 C) Oral 88 16 93 % -  05/29/16 1938 132/85 97.8 F (36.6 C) Oral 90 16 90 % -  05/29/16 1852 (!) 134/92 97.8 F (36.6 C) Oral 88 18 91 % -  05/29/16 1730 - - - 87 (!) 21 96 % -  05/29/16 1715 - - - 85 16 96 % -  05/29/16 1700 - - - 80 (!) 23 95 % -  05/29/16 1645 - - - - 18 - -  05/29/16 1630 - - - (!) 59 13 97 % -  05/29/16 1615 - - - (!) 56 12 99 % -  05/29/16 1600 - - - 70 14 96 % -  05/29/16 1545 - - - 69 17 96 % -  05/29/16 1530 - - - (!) 55 11 96 % -  05/29/16 1515 - - - 68 14 96 % -  05/29/16 1500 - - - 60 13 96 % -  05/29/16 1445 - - - (!) 51 11 97 % -  05/29/16 1430 (!) 118/96 - - (!) 53 14 98 % -  05/29/16 1415 - - - (!) 57 13 96 % -  05/29/16 1400 - - - (!) 57 14 94 % -  05/29/16 1345 128/81 97.8 F (36.6 C) - 66 13 97 % -  05/29/16 1330 114/88 - - (!) 57 12 96 % -  05/29/16 1315 117/81 - - 71 15 97 % -  05/29/16 1300 106/81 - - 67 12 97 % -  05/29/16 1245 107/78 - - 66 13 97 % -  05/29/16 1231 110/84 97.7 F (36.5 C) - 72 - 98 % -  05/29/16 0849 (!) 150/96 98.6 F (37 C) Oral (!) 55 20 95 % -  05/29/16 0836 - - - - - - 124.7 kg (275 lb)  .  RECENT EKG RESULTS:    Orders placed or performed during the hospital encounter of 05/29/16  . EKG 12 lead  . EKG 12 lead    DISCHARGE INSTRUCTIONS:    DISCHARGE MEDICATIONS:     Medication List    STOP taking these medications   HYDROcodone-acetaminophen 5-325 MG tablet Commonly known as:  NORCO/VICODIN     TAKE these medications   amLODipine 10 MG tablet Commonly known as:  NORVASC Take 10 mg by mouth daily.   aspirin 81 MG tablet Take 81 mg by mouth 2 (two) times daily.   atorvastatin 40 MG tablet Commonly known as:  LIPITOR Take 40 mg by mouth  every evening.   colestipol 1 g tablet Commonly known as:  COLESTID Take 1-2 g by mouth 2 (two) times daily. 2 g in the morning and 1 g in the evening (2 hours before or after other medications)   furosemide 20 MG tablet Commonly known as:  LASIX Take 20 mg by mouth every evening.   gabapentin 300 MG capsule Commonly known as:  NEURONTIN Take 300-600 mg by mouth 2 (two) times daily. 600 mg in the morning and 300 mg in the evening   latanoprost 0.005 % ophthalmic solution Commonly known as:  XALATAN Place 1 drop into both eyes at bedtime.   meloxicam 7.5 MG tablet Commonly known as:  MOBIC Take 7.5 mg by mouth every evening.   omeprazole 20 MG capsule Commonly known as:  PRILOSEC Take 20 mg by mouth 2 (two) times daily.   ondansetron 4 MG tablet Commonly known as:  ZOFRAN Take 1 tablet (4 mg total) by mouth every 8 (eight) hours as needed for nausea or vomiting.   oxyCODONE-acetaminophen 5-325 MG tablet Commonly known as:  PERCOCET Take 1-2 tablets by mouth every 4 (four) hours as needed.   potassium chloride SA 20 MEQ tablet Commonly known as:  K-DUR,KLOR-CON Take 20 mEq by mouth daily. With Vitamin d3   PRESCRIPTION MEDICATION Inhale 1 puff into the lungs at bedtime. Unknown inhaler (patient states he will bring with him to his upcoming pre operative appointment)   PROBIOTIC PO Take 1 capsule by mouth daily.   sertraline 50 MG tablet Commonly known as:  ZOLOFT Take 50 mg by mouth daily.   terazosin 5 MG capsule Commonly known as:  HYTRIN Take 5 mg by mouth every evening.   timolol 0.5 % ophthalmic solution Commonly known as:  TIMOPTIC Place 1 drop into both eyes 2 (two) times daily.   Vitamin D 2000 units Caps Take 2,000 Units by mouth daily.       FOLLOW UP VISIT:   Follow-up Information  Vania ReaKEVIN M SUPPLE, MD.   Specialty:  Orthopedic Surgery Why:  call to be seen in 10-14 days Contact information: [address] [place]  [id number] [phone]           DISCHARGE TO: home  DISPOSITION: Good  DISCHARGE CONDITION:  Rodolph BongGood   Caetano Oberhaus for Dr. Francena HanlyKevin Supple 05/30/2016, 8:28 AM

## 2016-05-30 NOTE — Progress Notes (Signed)
Pt states he fell 3 times and this is what happened to his shoulder.  IV d/c for discharged, went over discharge papers and medications with full understanding

## 2016-06-02 ENCOUNTER — Encounter (HOSPITAL_COMMUNITY): Payer: Self-pay | Admitting: Orthopedic Surgery

## 2016-09-04 ENCOUNTER — Other Ambulatory Visit: Payer: Self-pay | Admitting: Orthopaedic Surgery

## 2016-09-04 DIAGNOSIS — M47816 Spondylosis without myelopathy or radiculopathy, lumbar region: Secondary | ICD-10-CM

## 2016-09-06 ENCOUNTER — Ambulatory Visit
Admission: RE | Admit: 2016-09-06 | Discharge: 2016-09-06 | Disposition: A | Discharge status: Home / Self Care | Payer: PRIVATE HEALTH INSURANCE | Source: Ambulatory Visit | Attending: Orthopaedic Surgery | Admitting: Orthopaedic Surgery

## 2016-09-06 DIAGNOSIS — M47816 Spondylosis without myelopathy or radiculopathy, lumbar region: Secondary | ICD-10-CM

## 2017-04-14 ENCOUNTER — Other Ambulatory Visit: Payer: Self-pay | Admitting: Orthopaedic Surgery

## 2017-04-14 DIAGNOSIS — M5126 Other intervertebral disc displacement, lumbar region: Secondary | ICD-10-CM

## 2017-04-19 ENCOUNTER — Ambulatory Visit
Admission: RE | Admit: 2017-04-19 | Discharge: 2017-04-19 | Disposition: A | Discharge status: Home / Self Care | Payer: Medicare Other | Source: Ambulatory Visit | Attending: Orthopaedic Surgery | Admitting: Orthopaedic Surgery

## 2017-04-19 DIAGNOSIS — M5126 Other intervertebral disc displacement, lumbar region: Secondary | ICD-10-CM

## 2020-02-21 NOTE — Progress Notes (Signed)
COVID Vaccine Completed: Date COVID Vaccine completed: COVID vaccine manufacturer: Pf[employer]    [facility] [employer]   PCP - Evelena Peat, MD Cardiologist - Arnette Schaumann, MD w/ cardiac clearance dated 02-02-20  Chest x-ray -  EKG -  Stress Test -  ECHO -  Cardiac Cath -   Sleep Study -  CPAP -   Fasting Blood Sugar -  Checks Blood Sugar _____ times a day  Blood Thinner Instructions: Aspirin Instructions: ASA 81 mg/ Effient  -May be held for 7 days  Last Dose:  Anesthesia review:   Patient denies shortness of breath, fever, cough and chest pain at PAT appointment   Patient verbalized understanding of instructions that were given to them at the PAT appointment. Patient was also instructed that they will need to review over the PAT instructions again at home before surgery.

## 2020-02-21 NOTE — Patient Instructions (Signed)
DUE TO COVID-19 ONLY ONE VISITOR IS ALLOWED TO COME WITH YOU AND STAY IN THE WAITING ROOM ONLY DURING PRE OP AND PROCEDURE DAY OF SURGERY. THE 1 VISITOR  MAY VISIT WITH YOU AFTER SURGERY IN YOUR PRIVATE ROOM DURING VISITING HOURS ONLY!  YOU NEED TO HAVE A COVID 19 TEST ON 02-25-20 @_______ , THIS TEST MUST BE DONE BEFORE SURGERY,  COVID TESTING SITE [address] [place] [phone], IT IS ON THE RIGHT GOING OUT [address] APPROXIMATELY  2 MINUTES PAST [employer] ON THE RIGHT. ONCE YOUR COVID TEST IS COMPLETED,  PLEASE BEGIN THE QUARANTINE INSTRUCTIONS AS OUTLINED IN YOUR HANDOUT.                Jeffery MerrittsGerard [name]  02/21/2020   Your procedure is scheduled on: 02-29-20   [facility]    Report to Admitting at 11:00 AM     Call this number if you have problems the morning of surgery [phone]    REMEMBER: AFTER MIDNIGHT. CLEAR LIQUIDS UNTIL 10:30 AM .  PLEASE FINISH ENSURE DRINK PER SURGEON ORDER  WHICH NEEDS TO BE COMPLETED AT  10:30 AM  .      CLEAR LIQUID DIET   Foods Allowed                                                                    Coffee and tea, regular and decaf                            Fruit ices (not with fruit pulp)                                      Iced Popsicles                                    Carbonated beverages, regular and diet                                    Cranberry, grape and apple juices Sports drinks like Gatorade Lightly seasoned clear broth or consume(fat free) Sugar, honey syrup ___________________________________________________________________      BRUSH YOUR TEETH MORNING OF SURGERY AND RINSE YOUR MOUTH OUT, NO CHEWING GUM CANDY OR MINTS.     Take these medicines the morning of surgery with A SIP OF WATER: Amlodipine (Norvasc), Omeprazole (Prilosec), Topamax (Topiramate), Effexor                                You may not have any metal on your body including hair pins and               piercings     Do not wear jewelry, cologne, lotions, powders or deodorant                   Men may shave face and neck.  Do not bring valuables to the hospital. [facility] IS NOT             RESPONSIBLE   FOR VALUABLES.  Contacts, dentures or bridgework may not be worn into surgery.  Leave suitcase in the car. After surgery it may be brought to your room.     Patients discharged the day of surgery will not be allowed to drive home. IF YOU ARE HAVING SURGERY AND GOING HOME THE SAME DAY, YOU MUST HAVE AN ADULT TO DRIVE YOU HOME AND BE WITH YOU FOR 24 HOURS. YOU MAY GO HOME BY TAXI OR UBER OR ORTHERWISE, BUT AN ADULT MUST ACCOMPANY YOU HOME AND STAY WITH YOU FOR 24 HOURS.  Name and phone number of your driver:  Special Instructions: N/A              Please read over the following fact sheets you were given: _____________________________________________________________________  [facility] - Preparing for Surgery Before surgery, you can play an important role.  Because skin is not sterile, your skin needs to be as free of germs as possible.  You can reduce the number of germs on your skin by washing with CHG (chlorahexidine gluconate) soap before surgery.  CHG is an antiseptic cleaner which kills germs and bonds with the skin to continue killing germs even after washing. Please DO NOT use if you have an allergy to CHG or antibacterial soaps.  If your skin becomes reddened/irritated stop using the CHG and inform your nurse when you arrive at Short Stay. Do not shave (including legs and underarms) for at least 48 hours prior to the first CHG shower.  You may shave your face/neck. Please follow these instructions carefully:  1.  Shower with CHG Soap the night before surgery and the  morning of Surgery.  2.  If you choose to wash your hair, wash your hair first as usual with your  normal  shampoo.  3.  After you shampoo, rinse your hair and body thoroughly to remove the  shampoo.                            4.  Use CHG as you would any other liquid soap.  You can apply chg directly  to the skin and wash                       Gently with a scrungie or clean washcloth.  5.  Apply the CHG Soap to your body ONLY FROM THE NECK DOWN.   Do not use on face/ open                           Wound or open sores. Avoid contact with eyes, ears mouth and genitals (private parts).                       Wash face,  Genitals (private parts) with your normal soap.             6.  Wash thoroughly, paying special attention to the area where your surgery  will be performed.  7.  Thoroughly rinse your body with warm water from the neck down.  8.  DO NOT shower/wash with your normal soap after using and rinsing off  the CHG Soap.  9.  Pat yourself dry with a clean towel.            10.  Wear clean pajamas.            11.  Place clean sheets on your bed the night of your first shower and do not  sleep with pets. Day of Surgery : Do not apply any lotions/deodorants the morning of surgery.  Please wear clean clothes to the hospital/surgery center.  FAILURE TO FOLLOW THESE INSTRUCTIONS MAY RESULT IN THE CANCELLATION OF YOUR SURGERY PATIENT SIGNATURE_________________________________  NURSE SIGNATURE__________________________________  ________________________________________________________________________     Jeffery [name]  An incentive spirometer is a tool that can help keep your lungs clear and active. This tool measures how well you are filling your lungs with each breath. Taking long deep breaths may help reverse or decrease the chance of developing breathing (pulmonary) problems (especially infection) following:  A long period of time when you are unable to move or be active. BEFORE THE PROCEDURE   If the spirometer includes an indicator to show your best effort, your nurse or respiratory therapist will set it to a desired goal.  If possible, sit up straight or lean  slightly forward. Try not to slouch.  Hold the incentive spirometer in an upright position. INSTRUCTIONS FOR USE  1. Sit on the edge of your bed if possible, or sit up as far as you can in bed or on a chair. 2. Hold the incentive spirometer in an upright position. 3. Breathe out normally. 4. Place the mouthpiece in your mouth and seal your lips tightly around it. 5. Breathe in slowly and as deeply as possible, raising the piston or the ball toward the top of the column. 6. Hold your breath for 3-5 seconds or for as long as possible. Allow the piston or ball to fall to the bottom of the column. 7. Remove the mouthpiece from your mouth and breathe out normally. 8. Rest for a few seconds and repeat Steps 1 through 7 at least 10 times every 1-2 hours when you are awake. Take your time and take a few normal breaths between deep breaths. 9. The spirometer may include an indicator to show your best effort. Use the indicator as a goal to work toward during each repetition. 10. After each set of 10 deep breaths, practice coughing to be sure your lungs are clear. If you have an incision (the cut made at the time of surgery), support your incision when coughing by placing a pillow or rolled up towels firmly against it. Once you are able to get out of bed, walk around indoors and cough well. You may stop using the incentive spirometer when instructed by your caregiver.  RISKS AND COMPLICATIONS  Take your time so you do not get dizzy or light-headed.  If you are in pain, you may need to take or ask for pain medication before doing incentive spirometry. It is harder to take a deep breath if you are having pain. AFTER USE  Rest and breathe slowly and easily.  It can be helpful to keep track of a log of your progress. Your caregiver can provide you with a simple table to help with this. If you are using the spirometer at home, follow these instructions: SEEK MEDICAL CARE IF:   You are having difficultly  using the spirometer.  You have trouble using the spirometer as often as instructed.  Your pain medication is not giving enough relief while using the spirometer.  You develop fever of 100.5 F (38.1 C) or higher. SEEK IMMEDIATE MEDICAL CARE IF:   You cough up bloody sputum that had not been present before.  You develop fever of 102 F (38.9 C) or greater.  You develop worsening pain at or near the incision site. MAKE SURE YOU:   Understand these instructions.  Will watch your condition.  Will get help right away if you are not doing well or get worse. Document Released: 10/27/2006 Document Revised: 09/08/2011 Document Reviewed: 12/28/2006 [employer] Patient Information 2014 [employer], [place].   ________________________________________________________________________  WHAT IS A BLOOD TRANSFUSION? Blood Transfusion Information  A transfusion is the replacement of blood or some of its parts. Blood is made up of multiple cells which provide different functions.  Red blood cells carry oxygen and are used for blood loss replacement.  White blood cells fight against infection.  Platelets control bleeding.  Plasma helps clot blood.  Other blood products are available for specialized needs, such as hemophilia or other clotting disorders. BEFORE THE TRANSFUSION  Who gives blood for transfusions?   Healthy volunteers who are fully evaluated to make sure their blood is safe. This is blood bank blood. Transfusion therapy is the safest it has ever been in the practice of medicine. Before blood is taken from a donor, a complete history is taken to make sure that person has no history of diseases nor engages in risky social behavior (examples are intravenous drug use or sexual activity with multiple partners). The donor's travel history is screened to minimize risk of transmitting infections, such as malaria. The donated blood is tested for signs of infectious diseases, such as HIV and  hepatitis. The blood is then tested to be sure it is compatible with you in order to minimize the chance of a transfusion reaction. If you or a relative donates blood, this is often done in anticipation of surgery and is not appropriate for emergency situations. It takes many days to process the donated blood. RISKS AND COMPLICATIONS Although transfusion therapy is very safe and saves many lives, the main dangers of transfusion include:   Getting an infectious disease.  Developing a transfusion reaction. This is an allergic reaction to something in the blood you were given. Every precaution is taken to prevent this. The decision to have a blood transfusion has been considered carefully by your caregiver before blood is given. Blood is not given unless the benefits outweigh the risks. AFTER THE TRANSFUSION  Right after receiving a blood transfusion, you will usually feel much better and more energetic. This is especially true if your red blood cells have gotten low (anemic). The transfusion raises the level of the red blood cells which carry oxygen, and this usually causes an energy increase.  The nurse administering the transfusion will monitor you carefully for complications. HOME CARE INSTRUCTIONS  No special instructions are needed after a transfusion. You may find your energy is better. Speak with your caregiver about any limitations on activity for underlying diseases you may have. SEEK MEDICAL CARE IF:   Your condition is not improving after your transfusion.  You develop redness or irritation at the intravenous (IV) site. SEEK IMMEDIATE MEDICAL CARE IF:  Any of the following symptoms occur over the next 12 hours:  Shaking chills.  You have a temperature by mouth above 102 F (38.9 C), not controlled by medicine.  Chest, back, or muscle pain.  People around you feel you are not acting correctly or are confused.  Shortness of  breath or difficulty breathing.  Dizziness and  fainting.  You get a rash or develop hives.  You have a decrease in urine output.  Your urine turns a dark color or changes to pink, red, or brown. Any of the following symptoms occur over the next 10 days:  You have a temperature by mouth above 102 F (38.9 C), not controlled by medicine.  Shortness of breath.  Weakness after normal activity.  The white part of the eye turns yellow (jaundice).  You have a decrease in the amount of urine or are urinating less often.  Your urine turns a dark color or changes to pink, red, or brown. Document Released: 06/13/2000 Document Revised: 09/08/2011 Document Reviewed: 01/31/2008 [facility] Patient Information 2014 [place], [place].  _______________________________________________________________________

## 2020-02-22 ENCOUNTER — Encounter (HOSPITAL_COMMUNITY)
Admission: RE | Admit: 2020-02-22 | Discharge: 2020-02-22 | Disposition: A | Discharge status: Home / Self Care | Payer: Medicare HMO | Source: Ambulatory Visit | Attending: Orthopedic Surgery | Admitting: Orthopedic Surgery

## 2020-02-29 ENCOUNTER — Encounter (HOSPITAL_COMMUNITY): Admission: RE | Payer: Self-pay | Source: Home / Self Care

## 2020-02-29 ENCOUNTER — Ambulatory Visit (HOSPITAL_COMMUNITY): Admission: RE | Admit: 2020-02-29 | Payer: Medicare HMO | Source: Home / Self Care | Admitting: Orthopedic Surgery

## 2020-02-29 SURGERY — TOTAL HIP REVISION
Anesthesia: Choice | Site: Hip | Laterality: Right

## 2020-03-08 ENCOUNTER — Other Ambulatory Visit (HOSPITAL_COMMUNITY): Payer: Self-pay | Admitting: Family Medicine

## 2020-03-08 ENCOUNTER — Other Ambulatory Visit: Payer: Self-pay | Admitting: Family Medicine

## 2020-03-08 DIAGNOSIS — R2681 Unsteadiness on feet: Secondary | ICD-10-CM

## 2020-03-14 ENCOUNTER — Other Ambulatory Visit (HOSPITAL_COMMUNITY): Payer: Self-pay | Admitting: Urology

## 2020-03-14 DIAGNOSIS — R972 Elevated prostate specific antigen [PSA]: Secondary | ICD-10-CM

## 2020-03-19 ENCOUNTER — Encounter (HOSPITAL_COMMUNITY): Payer: Self-pay

## 2020-03-19 ENCOUNTER — Ambulatory Visit (HOSPITAL_COMMUNITY)
Admission: RE | Admit: 2020-03-19 | Discharge: 2020-03-19 | Disposition: A | Discharge status: Home / Self Care | Payer: Medicare HMO | Source: Ambulatory Visit | Attending: Family Medicine | Admitting: Family Medicine

## 2020-03-19 ENCOUNTER — Other Ambulatory Visit: Payer: Self-pay

## 2020-03-19 DIAGNOSIS — R2681 Unsteadiness on feet: Secondary | ICD-10-CM

## 2020-03-21 ENCOUNTER — Ambulatory Visit (HOSPITAL_COMMUNITY): Admission: RE | Admit: 2020-03-21 | Payer: Medicare HMO | Source: Ambulatory Visit

## 2020-03-23 ENCOUNTER — Ambulatory Visit (HOSPITAL_COMMUNITY)
Admission: RE | Admit: 2020-03-23 | Discharge: 2020-03-23 | Disposition: A | Discharge status: Home / Self Care | Payer: Medicare HMO | Source: Ambulatory Visit | Attending: Family Medicine | Admitting: Family Medicine

## 2020-03-23 ENCOUNTER — Other Ambulatory Visit: Payer: Self-pay

## 2020-03-23 DIAGNOSIS — R2681 Unsteadiness on feet: Secondary | ICD-10-CM | POA: Diagnosis present

## 2020-03-23 MED ORDER — GADOBUTROL 1 MMOL/ML IV SOLN
10.0000 mL | Freq: Once | INTRAVENOUS | Status: AC | PRN
  Administered 2020-03-23: 10 mL via INTRAVENOUS

## 2020-03-23 NOTE — Progress Notes (Signed)
DUE TO COVID-19 ONLY ONE VISITOR IS ALLOWED TO COME WITH YOU AND STAY IN THE WAITING ROOM ONLY DURING PRE OP AND PROCEDURE DAY OF SURGERY. THE 1 VISITOR  MAY VISIT WITH YOU AFTER SURGERY IN YOUR PRIVATE ROOM DURING VISITING HOURS ONLY!  YOU NEED TO HAVE A COVID 19 TEST ON__10/2/21 _____ @_______ , THIS TEST MUST BE DONE BEFORE SURGERY,  COVID TESTING SITE [address] [place] , IT IS ON THE RIGHT GOING OUT [address] APPROXIMATELY  2 MINUTES PAST [employer] ON THE RIGHT. ONCE YOUR COVID TEST IS COMPLETED,  PLEASE BEGIN THE QUARANTINE INSTRUCTIONS AS OUTLINED IN YOUR HANDOUT.                Jeffery [name]  03/23/2020   Your procedure is scheduled on:  04/04/2020    Report to [facility] Main  Entrance   Report to admitting at    1000 AM     Call this number if you have problems the morning of surgery [phone]    REMEMBER: NO  SOLID FOOD CANDY OR GUM AFTER MIDNIGHT. CLEAR LIQUIDS UNTIL      0900am       . NOTHING BY MOUTH EXCEPT CLEAR LIQUIDS UNTIL    . PLEASE FINISH ENSURE DRINK PER SURGEON ORDER  WHICH NEEDS TO BE COMPLETED AT  0900am    .      CLEAR LIQUID DIET   Foods Allowed                                                                    Coffee and tea, regular and decaf                            Fruit ices (not with fruit pulp)                                      Iced Popsicles                                    Carbonated beverages, regular and diet                                    Cranberry, grape and apple juices Sports drinks like Gatorade Lightly seasoned clear broth or consume(fat free) Sugar, honey syrup ___________________________________________________________________      BRUSH YOUR TEETH MORNING OF SURGERY AND RINSE YOUR MOUTH OUT, NO CHEWING GUM CANDY OR MINTS.     Take these medicines the morning of surgery with A SIP OF WATER:     Amlodipine, Eye drops as usual, Topamax., Effexor,  DO NOT TAKE ANY  DIABETIC MEDICATIONS DAY OF YOUR SURGERY                               You may not have any metal on your body including hair pins and  piercings  Do not wear jewelry, make-up, lotions, powders or perfumes, deodorant             Do not wear nail polish on your fingernails.  Do not shave  48 hours prior to surgery.              Men may shave face and neck.   Do not bring valuables to the hospital. [facility].  Contacts, dentures or bridgework may not be worn into surgery.  Leave suitcase in the car. After surgery it may be brought to your room.     Patients discharged the day of surgery will not be allowed to drive home. IF YOU ARE HAVING SURGERY AND GOING HOME THE SAME DAY, YOU MUST HAVE AN ADULT TO DRIVE YOU HOME AND BE WITH YOU FOR 24 HOURS. YOU MAY GO HOME BY TAXI OR UBER OR ORTHERWISE, BUT AN ADULT MUST ACCOMPANY YOU HOME AND STAY WITH YOU FOR 24 HOURS.  Name and phone number of your driver:  Special Instructions: N/A              Please read over the following fact sheets you were given: _____________________________________________________________________  [facility] - Preparing for Surgery Before surgery, you can play an important role.  Because skin is not sterile, your skin needs to be as free of germs as possible.  You can reduce the number of germs on your skin by washing with CHG (chlorahexidine gluconate) soap before surgery.  CHG is an antiseptic cleaner which kills germs and bonds with the skin to continue killing germs even after washing. Please DO NOT use if you have an allergy to CHG or antibacterial soaps.  If your skin becomes reddened/irritated stop using the CHG and inform your nurse when you arrive at Short Stay. Do not shave (including legs and underarms) for at least 48 hours prior to the first CHG shower.  You may shave your face/neck. Please follow these instructions carefully:  1.  Shower with CHG Soap  the night before surgery and the  morning of Surgery.  2.  If you choose to wash your hair, wash your hair first as usual with your  normal  shampoo.  3.  After you shampoo, rinse your hair and body thoroughly to remove the  shampoo.                           4.  Use CHG as you would any other liquid soap.  You can apply chg directly  to the skin and wash                       Gently with a scrungie or clean washcloth.  5.  Apply the CHG Soap to your body ONLY FROM THE NECK DOWN.   Do not use on face/ open                           Wound or open sores. Avoid contact with eyes, ears mouth and genitals (private parts).                       Wash face,  Genitals (private parts) with your normal soap.             6.  Wash thoroughly, paying special attention to the area where your surgery  will be performed.  7.  Thoroughly rinse your body with warm water from the neck down.  8.  DO NOT shower/wash with your normal soap after using and rinsing off  the CHG Soap.                9.  Pat yourself dry with a clean towel.            10.  Wear clean pajamas.            11.  Place clean sheets on your bed the night of your first shower and do not  sleep with pets. Day of Surgery : Do not apply any lotions/deodorants the morning of surgery.  Please wear clean clothes to the hospital/surgery center.  FAILURE TO FOLLOW THESE INSTRUCTIONS MAY RESULT IN THE CANCELLATION OF YOUR SURGERY PATIENT SIGNATURE_________________________________  NURSE SIGNATURE__________________________________  ________________________________________________________________________

## 2020-03-24 ENCOUNTER — Ambulatory Visit (HOSPITAL_COMMUNITY): Admission: RE | Admit: 2020-03-24 | Payer: Medicare HMO | Source: Ambulatory Visit

## 2020-03-26 ENCOUNTER — Other Ambulatory Visit: Payer: Self-pay

## 2020-03-26 ENCOUNTER — Encounter (HOSPITAL_COMMUNITY)
Admission: RE | Admit: 2020-03-26 | Discharge: 2020-03-26 | Disposition: A | Discharge status: Home / Self Care | Payer: Medicare HMO | Source: Ambulatory Visit | Attending: Orthopedic Surgery | Admitting: Orthopedic Surgery

## 2020-03-26 ENCOUNTER — Encounter (HOSPITAL_COMMUNITY): Payer: Self-pay

## 2020-03-26 DIAGNOSIS — I2510 Atherosclerotic heart disease of native coronary artery without angina pectoris: Secondary | ICD-10-CM | POA: Diagnosis not present

## 2020-03-26 DIAGNOSIS — Z01812 Encounter for preprocedural laboratory examination: Secondary | ICD-10-CM | POA: Insufficient documentation

## 2020-03-26 DIAGNOSIS — Z79899 Other long term (current) drug therapy: Secondary | ICD-10-CM | POA: Insufficient documentation

## 2020-03-26 DIAGNOSIS — K219 Gastro-esophageal reflux disease without esophagitis: Secondary | ICD-10-CM | POA: Insufficient documentation

## 2020-03-26 DIAGNOSIS — M24451 Recurrent dislocation, right hip: Secondary | ICD-10-CM | POA: Diagnosis not present

## 2020-03-26 DIAGNOSIS — I10 Essential (primary) hypertension: Secondary | ICD-10-CM | POA: Diagnosis not present

## 2020-03-26 DIAGNOSIS — N400 Enlarged prostate without lower urinary tract symptoms: Secondary | ICD-10-CM | POA: Diagnosis not present

## 2020-03-26 DIAGNOSIS — G4730 Sleep apnea, unspecified: Secondary | ICD-10-CM | POA: Insufficient documentation

## 2020-03-26 DIAGNOSIS — E785 Hyperlipidemia, unspecified: Secondary | ICD-10-CM | POA: Diagnosis not present

## 2020-03-26 HISTORY — DX: Headache, unspecified: R51.9

## 2020-03-26 HISTORY — DX: Atherosclerotic heart disease of native coronary artery without angina pectoris: I25.10

## 2020-03-26 HISTORY — DX: Fracture of neck, unspecified, initial encounter: S12.9XXA

## 2020-03-26 LAB — CBC
HCT: 41 % (ref 39.0–52.0)
Hemoglobin: 13.5 g/dL (ref 13.0–17.0)
MCH: 30.8 pg (ref 26.0–34.0)
MCHC: 32.9 g/dL (ref 30.0–36.0)
MCV: 93.6 fL (ref 80.0–100.0)
Platelets: 135 10*3/uL — ABNORMAL LOW (ref 150–400)
RBC: 4.38 MIL/uL (ref 4.22–5.81)
RDW: 14.1 % (ref 11.5–15.5)
WBC: 8.1 10*3/uL (ref 4.0–10.5)
nRBC: 0 % (ref 0.0–0.2)

## 2020-03-26 LAB — COMPREHENSIVE METABOLIC PANEL
ALT: 12 U/L (ref 0–44)
AST: 15 U/L (ref 15–41)
Albumin: 3.8 g/dL (ref 3.5–5.0)
Alkaline Phosphatase: 69 U/L (ref 38–126)
Anion gap: 7 (ref 5–15)
BUN: 19 mg/dL (ref 8–23)
CO2: 23 mmol/L (ref 22–32)
Calcium: 8.8 mg/dL — ABNORMAL LOW (ref 8.9–10.3)
Chloride: 110 mmol/L (ref 98–111)
Creatinine, Ser: 1.01 mg/dL (ref 0.61–1.24)
GFR calc Af Amer: >60 mL/min (ref >60)
GFR calc non Af Amer: >60 mL/min (ref >60)
Glucose, Bld: 102 mg/dL — ABNORMAL HIGH (ref 70–99)
Potassium: 3.7 mmol/L (ref 3.5–5.1)
Sodium: 140 mmol/L (ref 135–145)
Total Bilirubin: 0.7 mg/dL (ref 0.3–1.2)
Total Protein: 6.7 g/dL (ref 6.5–8.1)

## 2020-03-26 LAB — SURGICAL PCR SCREEN
MRSA, PCR: NEGATIVE
Staphylococcus aureus: NEGATIVE

## 2020-03-26 LAB — APTT: aPTT: 35 seconds (ref 24–36)

## 2020-03-26 LAB — PROTIME-INR
INR: 1.1 (ref 0.8–1.2)
Prothrombin Time: 13.6 seconds (ref 11.4–15.2)

## 2020-03-26 NOTE — Progress Notes (Signed)
Wife called back and [facility] stating pt takes Amlodipine and Cozaar in am for clarification.

## 2020-03-26 NOTE — Progress Notes (Addendum)
Anesthesia Review:  PCP: DR Andrey Campanile - [facility] office - LOV Care Everywhere 02/03/2020.  Cardiologist : DR Dot Been L[facility]- 12/13/19.   LOV - Care Everywhere  Ortho- DR Sandi Carne - [place] LOV 11/2019  Chest x-ray : EKG : Have rqueested via fax 12 lead ekg tracing from [facility].  08/2019 on chart  Echo : 01/06/2020 on chart  Stress test: Cardiac Cath  And PCI  07/11/19 on chart  Activity level: no  Sleep Study/ CPAP :yes  Fasting Blood Sugar :      / Checks Blood Sugar -- times a day:   Blood Thinner/ Instructions /Last Dose: ASA / Instructions/ Last Dose :  Prasugrel- per wife stop 7 days prior to surgery  Have requested cardiac clearance from office of DR Aluisio.  [facility] for [employer].   LOV- DR hall- elevated PSA 03/01/20 on chart

## 2020-03-27 NOTE — Progress Notes (Signed)
Anesthesia Chart Review   Case: [id number] Date/Time: 04/04/20 1140   Procedure: Right hip revision to constrained liner (Right Hip) -   Anesthesia type: Choice   Pre-op diagnosis: Recurrent dislocations right total hip   Location: [facility] 08 / [facility]   Surgeons: [name], MD      DISCUSSION:84 y.o. never smoker with h/o GERD, HLD, sleep apnea, HTN, CAD (DES 07/11/2019), BPH, recurrent hip dislocation right hip scheduled for above procedure 04/04/2020 with Dr. [name].   Clearance received from PCP, Dr. Andrey Campanile, which states pt is moderate risk for surgical procedure.  May hold Prasugrel/Effient 7 days prior to surgery.   Clearance received from cardiology which states, "As of his last visit with our PA on 12/11/19 he was stable for the planned procedure.  No further testing is required at this time assuming he has been clinically stable since that time.  Antiplatelet therapy may be held for as short of time as is prudent."  H/o lumbar fusion with hardware in place L3-4.  Spinal cord stimulator in place.   Nondisplaced C1 anterior arch fracture sustained 09/17/2019.  Treated non operatively with cervical hard collar.  He was last seen 03/20/20 by orthopedics.  Per OV note pt has weaned out of cervical collar without change in sx.  Denies radicular sx.  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  Per cervical xray 12/20/2019 shows healing C1 arch fracture.   Discussed with Dr. Hart Rochester.  VS: BP (!) 126/92   Pulse 66   Temp 37.1 C (Oral)   Ht 5\' 10"  (1.778 m)   SpO2 98%   BMI 39.46 kg/m   PROVIDERS: , DO is PCP   Yolanda Manges, MD is Cardiologist  LABS: Labs reviewed: Acceptable for surgery. (all labs ordered are listed, but only abnormal results are displayed)  Labs Reviewed  CBC - Abnormal; Notable for the following components:      Result Value   Platelets 135 (*)    All other components within normal limits   COMPREHENSIVE METABOLIC PANEL - Abnormal; Notable for the following components:   Glucose, Bld 102 (*)    Calcium 8.8 (*)    All other components within normal limits  SURGICAL PCR SCREEN  APTT  PROTIME-INR  TYPE AND SCREEN     IMAGES:   EKG: On chart   CV: Echo 01/06/2020 SUMMARY  Image Quality: Technically difficult.  Patient has lung disease.  The left ventricular size is normal.  LV ejection fraction = 50-55%.  Left ventricular systolic function is low normal.  There is mild mitral regurgitation.  There is mild tricuspid regurgitation.  Estimated right ventricular systolic pressure is 31 mmHg.  Moderate pulmonic valvular regurgitation.  Mild left ventricular hypertrophy   Cardiac Cath 07/11/2019 SUMMARY  Image Quality: Technically difficult.  Patient has lung disease.  The left ventricular size is normal.  LV ejection fraction = 50-55%.  Left ventricular systolic function is low normal.  There is mild mitral regurgitation.  There is mild tricuspid regurgitation.  Estimated right ventricular systolic pressure is 31 mmHg.  Moderate pulmonic valvular regurgitation.  Mild left ventricular hypertrophy  Interventional Summary  Successful PCI / 3.0 X 18 mm Resolute Onyx Drug Eluting Stent of the proximal  Right Coronary Artery.  Successful PCI / 2.0 X 15 mm Resolute Onyx Drug Eluting Stent of the distal  Left Anterior Descending Coronary Artery.  Interventional Recommendations  Medical therapy for CAD Risk  factor reduction  Dual anti-platelet therapy with Prasugrel and Aspirin 81 mg for at least 6    Past Medical History:  Diagnosis Date  . Anxiety   . BPH (benign prostatic hyperplasia)   . Coronary artery disease   . Depression   . Dyspnea    with exertion   . GERD (gastroesophageal reflux disease)   . Glaucoma   . Hard of hearing   . Headache   . History of pneumonia   . Hyperlipidemia   . Hypertension   . Memory impairment   . Neck fracture (HCC)     2-3/2-21 followed by DR Sandi Carne in [place]   . Nocturia   . Osteoarthritis   . Sleep apnea    wears CPAP does not know setting   . Urinary frequency   . Urinary incontinence   . Wears glasses     Past Surgical History:  Procedure Laterality Date  . CARDIAC CATHETERIZATION    . CIRCUMCISION    . COLONOSCOPY    . CORONARY ANGIOPLASTY     stents- 08/2019   . ESOPHAGOGASTRODUODENOSCOPY    . EYE SURGERY Bilateral    cataract  . HAND SURGERY Right   . HIP ARTHROPLASTY Right   . HIP ARTHROPLASTY Left 2006  . KNEE CARTILAGE SURGERY Bilateral   . LUMBAR LAMINECTOMY     L3 - L4  . RETINAL DETACHMENT SURGERY Right    x2  . REVERSE SHOULDER ARTHROPLASTY Right 05/29/2016   Procedure: REVERSE SHOULDER ARTHROPLASTY;  Surgeon: Francena Hanly, MD;  Location: [facility] OR;  Service: Orthopedics;  Laterality: Right;  . SHOULDER SURGERY Bilateral     MEDICATIONS: . acetaminophen (TYLENOL) 500 MG tablet  . acyclovir (ZOVIRAX) 800 MG tablet  . amLODipine (NORVASC) 10 MG tablet  . aspirin 81 MG tablet  . atorvastatin (LIPITOR) 40 MG tablet  . Cholecalciferol (VITAMIN D) 2000 units CAPS  . colestipol (COLESTID) 1 g tablet  . furosemide (LASIX) 20 MG tablet  . gabapentin (NEURONTIN) 300 MG capsule  . latanoprost (XALATAN) 0.005 % ophthalmic solution  . losartan (COZAAR) 25 MG tablet  . magnesium oxide (MAG-OX) 400 MG tablet  . meloxicam (MOBIC) 7.5 MG tablet  . mometasone (ASMANEX) 220 MCG/INH inhaler  . Multiple Vitamins-Minerals (PRESERVISION AREDS PO)  . omeprazole (PRILOSEC) 20 MG capsule  . ondansetron (ZOFRAN) 4 MG tablet  . oxyCODONE-acetaminophen (PERCOCET) 5-325 MG tablet  . potassium chloride SA (K-DUR,KLOR-CON) 20 MEQ tablet  . prasugrel (EFFIENT) 10 MG TABS tablet  . PRESCRIPTION MEDICATION  . Probiotic Product (PROBIOTIC PO)  . rizatriptan (MAXALT) 10 MG tablet  . sertraline (ZOLOFT) 50 MG tablet  . terazosin (HYTRIN) 5 MG capsule  . timolol (TIMOPTIC) 0.5 % ophthalmic  solution  . tiZANidine (ZANAFLEX) 4 MG tablet  . topiramate (TOPAMAX) 50 MG tablet  . venlafaxine XR (EFFEXOR-XR) 75 MG 24 hr capsule  . vitamin B-12 (CYANOCOBALAMIN) 1000 MCG tablet   No current facility-administered medications for this encounter.    Jodell Cipro, PA-C [facility] Pre-Surgical Testing [phone]

## 2020-03-27 NOTE — Anesthesia Preprocedure Evaluation (Addendum)
Anesthesia Evaluation  Patient identified by MRN, date of birth, ID band Patient awake    Reviewed: Allergy & Precautions, NPO status , Patient's Chart, lab work & pertinent test results  Airway Mallampati: III  TM Distance: >3 FB Neck ROM: Limited    Dental  (+) Caps, Poor Dentition   Pulmonary sleep apnea and Continuous Positive Airway Pressure Ventilation ,    Pulmonary exam normal        Cardiovascular hypertension, Pt. on medications + CAD and + Cardiac Stents  Normal cardiovascular exam     Neuro/Psych  Headaches,    GI/Hepatic Neg liver ROS,   Endo/Other  negative endocrine ROS  Renal/GU negative Renal ROS     Musculoskeletal  (+) Arthritis , Osteoarthritis,    Abdominal (+) + obese,   Peds  Hematology negative hematology ROS (+)   Anesthesia Other Findings FINDINGS: Brain: Image quality degraded by mild motion.  Mild to moderate atrophy. Hippocampal atrophy is present bilaterally. Mild ventricular enlargement consistent with atrophy.  Negative for acute infarct. Negative for hemorrhage or mass. Moderate chronic microvascular ischemic change in the white matter.  Normal enhancement postcontrast administration.  Vascular: Normal arterial flow voids.  Skull and upper cervical spine: No acute skeletal abnormality. C1-2 arthropathy with prominent pannus behind the dens. No significant spinal stenosis.  Sinuses/Orbits: Mild mucosal edema paranasal sinuses. Bilateral ocular surgery. Bilateral retinal detachment.  Other: None  IMPRESSION: No acute abnormality  Mild to moderate atrophy. Moderate chronic microvascular ischemic changes in the white matter.   Electronically Signed   By: Marlan Palau M.D.   On: 03/23/2020 12:59  Result History  MR BRAIN W WO CONTRAST (Order #[id number]) on 03/23/2020 - Order Result History Report MyChart Results Release  MyChart Status: Code  Expired Results Release Encounter-Level Documents:  There are no encounter-level documents. Order-Level Documents:  There are no order-level documents. Hospital account-Level Documents:  There are no hospital account-level documents. Orders Requiring a Screening Form  Procedure Order Status Form Status MR BRAIN W WO CONTRAST Completed Completed Vitals  Height Weight BMI (Calculated) 5\' 10"  (1.778 m) 117.9 kg 37.31 Protocol Documents  Imaging Protocol Imaging  Imaging Information MR BRAIN W WO CONTRAST: Patient Communication  Add Comments Not seen Resulted by:  Signed Date/Time  Phone Pager [place], CHUCK 03/23/2020 12:59 PM [phone] Study Notes   [phone] on 03/23/2020 11:35 AM Op, pt unsteady on feet, pt somewhat confused    pt had Neurostimulator   sequences had to be modified for B1+RMS calculations on each sequence, pt had difficulty holding still    10 ml's gadavist  Imaging Related Medications  Medication  gadobutrol (GADAVIST) 1 MMOL/ML injection 10 mL (Completed) Route: Intravenous Admin Amount: 10 mL Volume: 10 mL PRN Reason(s): contrast Last Admin Time: 03/23/20 1135 Number of Expected Doses: 1 Most Recent Administration: User Action Time Recorded Time Dose Route Site Comment Action Reason 03/25/20 03/23/20 1135 03/23/20 1135 10 mL Intravenous   Contrast Given  Full Administration Report          Original Order  Ordered On Ordered By  03/08/2020 2:52 PM 05/08/2020    External Result Report  External Result Report    Reproductive/Obstetrics                           Anesthesia Physical Anesthesia Plan  ASA: III  Anesthesia Plan: General   Post-op Pain Management:    Induction:  Intravenous  PONV Risk Score and Plan: 2 and Ondansetron and Dexamethasone  Airway Management Planned: Oral ETT and Video Laryngoscope Planned  Additional Equipment:   Intra-op  Plan:   Post-operative Plan: Extubation in OR  Informed Consent: I have reviewed the patients History and Physical, chart, labs and discussed the procedure including the risks, benefits and alternatives for the proposed anesthesia with the patient or authorized representative who has indicated his/her understanding and acceptance.     Dental advisory given  Plan Discussed with: CRNA  Anesthesia Plan Comments: (Previous C1 fracture, treated non-operatively w/ C collar. Limited neck mobility. Also, lumbar fusion w/ spinal cord stimulator in place. If GA chosen, Mr. Biller may require Glidescope.Cardiac stents 2/21 on anticoagulants. )     Anesthesia Quick Evaluation

## 2020-03-29 ENCOUNTER — Encounter (HOSPITAL_COMMUNITY): Payer: Self-pay

## 2020-03-29 ENCOUNTER — Inpatient Hospital Stay (HOSPITAL_COMMUNITY): Admission: RE | Admit: 2020-03-29 | Payer: Medicare HMO | Source: Ambulatory Visit

## 2020-03-29 ENCOUNTER — Ambulatory Visit (HOSPITAL_COMMUNITY): Admission: RE | Admit: 2020-03-29 | Payer: Medicare HMO | Source: Ambulatory Visit

## 2020-03-31 ENCOUNTER — Other Ambulatory Visit (HOSPITAL_COMMUNITY)
Admission: RE | Admit: 2020-03-31 | Discharge: 2020-03-31 | Disposition: A | Discharge status: Home / Self Care | Payer: Medicare HMO | Source: Ambulatory Visit | Attending: Orthopedic Surgery | Admitting: Orthopedic Surgery

## 2020-03-31 DIAGNOSIS — Z01812 Encounter for preprocedural laboratory examination: Secondary | ICD-10-CM | POA: Diagnosis present

## 2020-03-31 DIAGNOSIS — Z20822 Contact with and (suspected) exposure to covid-19: Secondary | ICD-10-CM | POA: Insufficient documentation

## 2020-03-31 LAB — SARS CORONAVIRUS 2 (TAT 6-24 HRS): SARS Coronavirus 2: NEGATIVE

## 2020-04-03 NOTE — Progress Notes (Signed)
Spoke to patient's wife and advised her of new arrival time of 8:30 am  and of time to drink presurgical drink by 8 am.

## 2020-04-03 NOTE — H&P (Signed)
TOTAL HIP REVISION ADMISSION H&P  Patient is admitted for right revision total hip arthroplasty.  Subjective:  Chief Complaint: right hip pain  HPI: Jeffery [name], 84 y.o. male, has a history of pain and functional disability in the right hip due to failed right total hip arthroplasty  and patient has failed non-surgical conservative treatments for greater than 12 weeks to include activity modification. The indications for the revision total hip arthroplasty are recurrent dislocation.. His primary THA was in 2005 by Dr. Lequita Halt. He has had four dislocations total, several years apart each time. At this point, he wishes to proceed with revision to constrained liner.  Patient has evidence of right hip prosthesis in good positioning with no evidence of periprosthetic abnormalities by imaging studies.  This condition presents safety issues increasing the risk of falls.  There is no current active infection.  Patient Active Problem List   Diagnosis Date Noted  . 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 Anxiety   . BPH (benign prostatic hyperplasia)   . Coronary artery disease   . Depression   . Dyspnea    with exertion   . GERD (gastroesophageal reflux disease)   . Glaucoma   . Hard of hearing   . Headache   . History of pneumonia   . Hyperlipidemia   . Hypertension   . Memory impairment   . Neck fracture (HCC)    2-3/2-21 followed by DR Sandi Carne in [place]   . Nocturia   . Osteoarthritis   . Sleep apnea    wears CPAP does not know setting   . Urinary frequency   . Urinary incontinence   . Wears glasses     Past Surgical History:  Procedure Laterality Date  . CARDIAC CATHETERIZATION    . CIRCUMCISION    . COLONOSCOPY    . CORONARY ANGIOPLASTY     stents- 08/2019   . ESOPHAGOGASTRODUODENOSCOPY    . EYE SURGERY Bilateral    cataract  . HAND SURGERY Right   . HIP ARTHROPLASTY Right   . HIP ARTHROPLASTY Left 2006   . KNEE CARTILAGE SURGERY Bilateral   . LUMBAR LAMINECTOMY     L3 - L4  . RETINAL DETACHMENT SURGERY Right    x2  . REVERSE SHOULDER ARTHROPLASTY Right 05/29/2016   Procedure: REVERSE SHOULDER ARTHROPLASTY;  Surgeon: Francena Hanly, MD;  Location: [facility] OR;  Service: Orthopedics;  Laterality: Right;  . SHOULDER SURGERY Bilateral     No current facility-administered medications for this encounter.   Current Outpatient Medications  Medication Sig Dispense Refill Last Dose  . acetaminophen (TYLENOL) 500 MG tablet Take 1,000 mg by mouth every 6 (six) hours as needed for moderate pain.     Marland Kitchen acyclovir (ZOVIRAX) 800 MG tablet Take 800 mg by mouth daily as needed (shingles).     Marland Kitchen amLODipine (NORVASC) 10 MG tablet Take 10 mg by mouth daily.     Marland Kitchen aspirin 81 MG tablet Take 81 mg by mouth 2 (two) times daily.     Marland Kitchen atorvastatin (LIPITOR) 40 MG tablet Take 40 mg by mouth every evening.     . Cholecalciferol (VITAMIN D) 2000 units CAPS Take 2,000 Units by mouth daily.     . colestipol (COLESTID) 1 g tablet Take 1-2 g by mouth See admin instructions. Take 2 g by mouth in the morning and 1 g in the evening     . furosemide (LASIX) 20  MG tablet Take 20 mg by mouth daily.      Marland Kitchen latanoprost (XALATAN) 0.005 % ophthalmic solution Place 1 drop into both eyes at bedtime.  1   . losartan (COZAAR) 25 MG tablet Take 25 mg by mouth daily.     . magnesium oxide (MAG-OX) 400 MG tablet Take 400 mg by mouth daily.     . meloxicam (MOBIC) 7.5 MG tablet Take 7.5 mg by mouth every evening.      . mometasone (ASMANEX) 220 MCG/INH inhaler Inhale 2 puffs into the lungs at bedtime.     . Multiple Vitamins-Minerals (PRESERVISION AREDS PO) Take 1 capsule by mouth in the morning and at bedtime.     Marland Kitchen omeprazole (PRILOSEC) 20 MG capsule Take 20 mg by mouth 2 (two) times daily as needed (acid reflux).      . potassium chloride SA (K-DUR,KLOR-CON) 20 MEQ tablet Take 20 mEq by mouth daily.      . prasugrel (EFFIENT) 10 MG TABS  tablet Take 10 mg by mouth daily.     . Probiotic Product (PROBIOTIC PO) Take 1 capsule by mouth daily.     . rizatriptan (MAXALT) 10 MG tablet Take 10 mg by mouth as needed for migraine. May repeat in 2 hours if needed     . terazosin (HYTRIN) 5 MG capsule Take 5 mg by mouth every evening.     . timolol (TIMOPTIC) 0.5 % ophthalmic solution Place 1 drop into both eyes 2 (two) times daily.  0   . tiZANidine (ZANAFLEX) 4 MG tablet Take 4 mg by mouth in the morning and at bedtime.     . topiramate (TOPAMAX) 50 MG tablet Take 100 mg by mouth 2 (two) times daily.      Marland Kitchen venlafaxine XR (EFFEXOR-XR) 75 MG 24 hr capsule Take 150 mg by mouth daily with breakfast.     . vitamin B-12 (CYANOCOBALAMIN) 1000 MCG tablet Take 1,000 mcg by mouth daily.     Marland Kitchen gabapentin (NEURONTIN) 300 MG capsule Take 300-600 mg by mouth 2 (two) times daily. 600 mg in the morning and 300 mg in the evening (Patient not taking: Reported on 02/15/2020)     . ondansetron (ZOFRAN) 4 MG tablet Take 1 tablet (4 mg total) by mouth every 8 (eight) hours as needed for nausea or vomiting. (Patient not taking: Reported on 02/15/2020) 20 tablet 0   . oxyCODONE-acetaminophen (PERCOCET) 5-325 MG tablet Take 1-2 tablets by mouth every 4 (four) hours as needed. (Patient not taking: Reported on 02/15/2020) 60 tablet 0   . PRESCRIPTION MEDICATION Inhale 1 puff into the lungs at bedtime. Unknown inhaler (patient states he will bring with him to his upcoming pre operative appointment) (Patient not taking: Reported on 02/15/2020)     . sertraline (ZOLOFT) 50 MG tablet Take 50 mg by mouth daily. (Patient not taking: Reported on 02/15/2020)      No Known Allergies  Social History   Tobacco Use  . Smoking status: Never Smoker  . Smokeless tobacco: Never Used  . Tobacco comment: quit in approximately  2000  Substance Use Topics  . Alcohol use: Yes    Comment: occasional    No family history on file.    Review of Systems  Constitutional: Negative for  chills and fever.  Respiratory: Negative for cough and shortness of breath.   Cardiovascular: Negative for chest pain.  Gastrointestinal: Negative for nausea and vomiting.  Musculoskeletal: Positive for arthralgias.    Objective:  Physical  Exam Patient is an 84 year old male.  Patient is an 84 year old male. Well nourished and well developed. General: Alert and oriented x3, cooperative and pleasant, no acute distress. Head: normocephalic, atraumatic, neck supple. Eyes: EOMI. Respiratory: breath sounds clear in all fields, no wheezing, rales, or rhonchi. Cardiovascular: Regular rate and rhythm, no murmurs, gallops or rubs. Abdomen: non-tender to palpation and soft, normoactive bowel sounds. Musculoskeletal: Right Hip Exam: The range of motion: Flexion to 100 degrees, Internal Rotation to 20 degrees, External Rotation to 30 degrees, and abduction to 30 degrees without discomfort. There is no tenderness over the greater trochanteric bursa.  Calves soft and nontender. Motor function intact in LE. Strength 5/5 LE bilaterally. Neuro: Distal pulses 2+. Sensation to light touch intact in LE.  Vital signs in last 24 hours:     Labs:   Estimated body mass index is 39.46 kg/m as calculated from the following:   Height as of 03/26/20: 5\' 10"  (1.778 m).   Weight as of 05/29/16: 124.7 kg.  Imaging Review:  Plain radiographs demonstrate right hip prosthesis in good positioning with no evidence of periprosthetic abnormalities or obvious loosening.     Assessment/Plan:  Recurrent dislocation, right hip(s) with failed previous arthroplasty.  The patient history, physical examination, clinical judgement of the provider and imaging studies are consistent with end stage degenerative joint disease of the right hip(s), previous total hip arthroplasty. Revision total hip arthroplasty is deemed medically necessary. The treatment options including medical management, injection therapy,  arthroscopy and arthroplasty were discussed at length. The risks and benefits of total hip arthroplasty were presented and reviewed. The risks due to aseptic loosening, infection, stiffness, dislocation/subluxation,  thromboembolic complications and other imponderables were discussed.  The patient acknowledged the explanation, agreed to proceed with the plan and consent was signed. Patient is being admitted for inpatient treatment for surgery, pain control, PT, OT, prophylactic antibiotics, VTE prophylaxis, progressive ambulation and ADL's and discharge planning. The patient is planning to be discharged home.   Therapy Plans: Wife would like HHPT for assistance Disposition: Home with wife Planned DVT Prophylaxis: Effient 10 mg daily + aspirin 81 mg DME needed: none PCP: Dr. 05/31/16, clearance received  Cardiologist: Dr. Evelena Peat, clearance received TXA: IV Allergies: NKDA Anesthesia Concerns: Hx of cervical fracture in 08/2019 BMI: 38.2 Not diabetic.  Other: Hx of cervical fracture in March 2021. Hx of two cardiac stents in February 2021. Has severe migraines, has been using rizatriptan regularly this week.  - Patient was instructed on what medications to stop prior to surgery. - Follow-up visit in 2 weeks with Dr. March 2021 - Begin physical therapy following surgery - Pre-operative lab work as pre-surgical testing - Prescriptions will be provided in hospital at time of discharge  Lequita Halt, PA-C Orthopedic Surgery [facility] [place] [phone]

## 2020-04-04 ENCOUNTER — Ambulatory Visit (HOSPITAL_COMMUNITY)
Admission: RE | Admit: 2020-04-04 | Discharge: 2020-04-05 | Disposition: A | Discharge status: Home / Self Care | Payer: Medicare HMO | Attending: Orthopedic Surgery | Admitting: Orthopedic Surgery

## 2020-04-04 ENCOUNTER — Ambulatory Visit (HOSPITAL_COMMUNITY): Payer: Medicare HMO | Admitting: Anesthesiology

## 2020-04-04 ENCOUNTER — Ambulatory Visit (HOSPITAL_COMMUNITY): Payer: Medicare HMO | Admitting: Physician Assistant

## 2020-04-04 ENCOUNTER — Ambulatory Visit (HOSPITAL_COMMUNITY): Payer: Medicare HMO

## 2020-04-04 ENCOUNTER — Encounter (HOSPITAL_COMMUNITY)
Admission: RE | Disposition: A | Discharge status: Home / Self Care | Payer: Self-pay | Source: Home / Self Care | Attending: Orthopedic Surgery

## 2020-04-04 ENCOUNTER — Other Ambulatory Visit: Payer: Self-pay

## 2020-04-04 DIAGNOSIS — R519 Headache, unspecified: Secondary | ICD-10-CM | POA: Insufficient documentation

## 2020-04-04 DIAGNOSIS — Z79899 Other long term (current) drug therapy: Secondary | ICD-10-CM | POA: Diagnosis not present

## 2020-04-04 DIAGNOSIS — T84020A Dislocation of internal right hip prosthesis, initial encounter: Secondary | ICD-10-CM | POA: Diagnosis present

## 2020-04-04 DIAGNOSIS — Z7951 Long term (current) use of inhaled steroids: Secondary | ICD-10-CM | POA: Insufficient documentation

## 2020-04-04 DIAGNOSIS — I2510 Atherosclerotic heart disease of native coronary artery without angina pectoris: Secondary | ICD-10-CM | POA: Insufficient documentation

## 2020-04-04 DIAGNOSIS — F329 Major depressive disorder, single episode, unspecified: Secondary | ICD-10-CM | POA: Diagnosis not present

## 2020-04-04 DIAGNOSIS — Z96611 Presence of right artificial shoulder joint: Secondary | ICD-10-CM | POA: Insufficient documentation

## 2020-04-04 DIAGNOSIS — M1990 Unspecified osteoarthritis, unspecified site: Secondary | ICD-10-CM | POA: Insufficient documentation

## 2020-04-04 DIAGNOSIS — Z96649 Presence of unspecified artificial hip joint: Secondary | ICD-10-CM

## 2020-04-04 DIAGNOSIS — H409 Unspecified glaucoma: Secondary | ICD-10-CM | POA: Diagnosis not present

## 2020-04-04 DIAGNOSIS — Z96643 Presence of artificial hip joint, bilateral: Secondary | ICD-10-CM | POA: Diagnosis not present

## 2020-04-04 DIAGNOSIS — K219 Gastro-esophageal reflux disease without esophagitis: Secondary | ICD-10-CM | POA: Insufficient documentation

## 2020-04-04 DIAGNOSIS — M25551 Pain in right hip: Secondary | ICD-10-CM | POA: Insufficient documentation

## 2020-04-04 DIAGNOSIS — E785 Hyperlipidemia, unspecified: Secondary | ICD-10-CM | POA: Diagnosis not present

## 2020-04-04 DIAGNOSIS — Z955 Presence of coronary angioplasty implant and graft: Secondary | ICD-10-CM | POA: Insufficient documentation

## 2020-04-04 DIAGNOSIS — I10 Essential (primary) hypertension: Secondary | ICD-10-CM | POA: Insufficient documentation

## 2020-04-04 DIAGNOSIS — Y792 Prosthetic and other implants, materials and accessory orthopedic devices associated with adverse incidents: Secondary | ICD-10-CM | POA: Insufficient documentation

## 2020-04-04 DIAGNOSIS — Z7982 Long term (current) use of aspirin: Secondary | ICD-10-CM | POA: Diagnosis not present

## 2020-04-04 DIAGNOSIS — G4730 Sleep apnea, unspecified: Secondary | ICD-10-CM | POA: Insufficient documentation

## 2020-04-04 DIAGNOSIS — F419 Anxiety disorder, unspecified: Secondary | ICD-10-CM | POA: Diagnosis not present

## 2020-04-04 DIAGNOSIS — T84028A Dislocation of other internal joint prosthesis, initial encounter: Secondary | ICD-10-CM

## 2020-04-04 DIAGNOSIS — Z791 Long term (current) use of non-steroidal anti-inflammatories (NSAID): Secondary | ICD-10-CM | POA: Insufficient documentation

## 2020-04-04 HISTORY — PX: TOTAL HIP REVISION: SHX763

## 2020-04-04 LAB — TYPE AND SCREEN
ABO/RH(D): A POS
Antibody Screen: NEGATIVE

## 2020-04-04 SURGERY — TOTAL HIP REVISION
Anesthesia: Spinal | Site: Hip | Laterality: Right

## 2020-04-04 MED ORDER — LATANOPROST 0.005 % OP SOLN
1.0000 [drp] | Freq: Every day | OPHTHALMIC | Status: DC
  Administered 2020-04-04: 1 [drp] via OPHTHALMIC
  Filled 2020-04-04: qty 2.5

## 2020-04-04 MED ORDER — BUPIVACAINE HCL 0.25 % IJ SOLN
INTRAMUSCULAR | Status: DC | PRN
  Administered 2020-04-04: 30 mL

## 2020-04-04 MED ORDER — POLYETHYLENE GLYCOL 3350 17 G PO PACK: 17.0000 g | PACK | Freq: Every day | ORAL | Status: DC | PRN

## 2020-04-04 MED ORDER — DEXAMETHASONE SODIUM PHOSPHATE 10 MG/ML IJ SOLN
10.0000 mg | Freq: Once | INTRAMUSCULAR | Status: AC
  Administered 2020-04-05: 10 mg via INTRAVENOUS
  Filled 2020-04-04: qty 1

## 2020-04-04 MED ORDER — DEXAMETHASONE SODIUM PHOSPHATE 10 MG/ML IJ SOLN
8.0000 mg | Freq: Once | INTRAMUSCULAR | Status: AC
  Administered 2020-04-04: 8 mg via INTRAVENOUS

## 2020-04-04 MED ORDER — FLEET ENEMA 7-19 GM/118ML RE ENEM: 1.0000 | ENEMA | Freq: Once | RECTAL | Status: DC | PRN

## 2020-04-04 MED ORDER — DIPHENHYDRAMINE HCL 12.5 MG/5ML PO ELIX
12.5000 mg | ORAL_SOLUTION | ORAL | Status: DC | PRN
  Administered 2020-04-05: 12.5 mg via ORAL
  Filled 2020-04-04: qty 5

## 2020-04-04 MED ORDER — VITAMIN B-12 1000 MCG PO TABS
1000.0000 ug | ORAL_TABLET | Freq: Every day | ORAL | Status: DC
  Administered 2020-04-05: 1000 ug via ORAL
  Filled 2020-04-04: qty 1

## 2020-04-04 MED ORDER — ORAL CARE MOUTH RINSE: 15.0000 mL | Freq: Once | OROMUCOSAL | Status: AC

## 2020-04-04 MED ORDER — PANTOPRAZOLE SODIUM 40 MG PO TBEC
40.0000 mg | DELAYED_RELEASE_TABLET | Freq: Two times a day (BID) | ORAL | Status: DC
  Administered 2020-04-04 – 2020-04-05 (×2): 40 mg via ORAL
  Filled 2020-04-04 (×2): qty 1

## 2020-04-04 MED ORDER — TIMOLOL MALEATE 0.5 % OP SOLN
1.0000 [drp] | Freq: Two times a day (BID) | OPHTHALMIC | Status: DC
  Administered 2020-04-04 – 2020-04-05 (×2): 1 [drp] via OPHTHALMIC
  Filled 2020-04-04: qty 5

## 2020-04-04 MED ORDER — METHOCARBAMOL 1000 MG/10ML IJ SOLN
500.0000 mg | Freq: Four times a day (QID) | INTRAVENOUS | Status: DC | PRN
  Filled 2020-04-04: qty 5

## 2020-04-04 MED ORDER — ONDANSETRON HCL 4 MG PO TABS: 4.0000 mg | ORAL_TABLET | Freq: Four times a day (QID) | ORAL | Status: DC | PRN

## 2020-04-04 MED ORDER — SODIUM CHLORIDE 0.9 % IV SOLN: INTRAVENOUS | Status: DC

## 2020-04-04 MED ORDER — CHLORHEXIDINE GLUCONATE 0.12 % MT SOLN
15.0000 mL | Freq: Once | OROMUCOSAL | Status: AC
  Administered 2020-04-04: 15 mL via OROMUCOSAL

## 2020-04-04 MED ORDER — TOPIRAMATE 100 MG PO TABS
100.0000 mg | ORAL_TABLET | Freq: Two times a day (BID) | ORAL | Status: DC
  Administered 2020-04-04: 100 mg via ORAL
  Filled 2020-04-04 (×2): qty 1

## 2020-04-04 MED ORDER — ACETAMINOPHEN 10 MG/ML IV SOLN: 1000.0000 mg | Freq: Once | INTRAVENOUS | Status: DC | PRN

## 2020-04-04 MED ORDER — POVIDONE-IODINE 10 % EX SWAB: 2.0000 "application " | Freq: Once | CUTANEOUS | Status: DC

## 2020-04-04 MED ORDER — FUROSEMIDE 20 MG PO TABS
20.0000 mg | ORAL_TABLET | Freq: Every day | ORAL | Status: DC
  Administered 2020-04-05: 20 mg via ORAL
  Filled 2020-04-04: qty 1

## 2020-04-04 MED ORDER — HYDROCODONE-ACETAMINOPHEN 5-325 MG PO TABS
1.0000 | ORAL_TABLET | ORAL | Status: DC | PRN
  Administered 2020-04-04 – 2020-04-05 (×3): 1 via ORAL
  Filled 2020-04-04: qty 2
  Filled 2020-04-04: qty 1
  Filled 2020-04-04: qty 2

## 2020-04-04 MED ORDER — CEFAZOLIN SODIUM-DEXTROSE 2-4 GM/100ML-% IV SOLN
2.0000 g | Freq: Four times a day (QID) | INTRAVENOUS | Status: AC
  Administered 2020-04-04 (×2): 2 g via INTRAVENOUS
  Filled 2020-04-04 (×2): qty 100

## 2020-04-04 MED ORDER — LIDOCAINE 2% (20 MG/ML) 5 ML SYRINGE
INTRAMUSCULAR | Status: DC | PRN
  Administered 2020-04-04: 100 mg via INTRAVENOUS

## 2020-04-04 MED ORDER — STERILE WATER FOR IRRIGATION IR SOLN
Status: DC | PRN
  Administered 2020-04-04: 2000 mL

## 2020-04-04 MED ORDER — AMLODIPINE BESYLATE 10 MG PO TABS
10.0000 mg | ORAL_TABLET | Freq: Every day | ORAL | Status: DC
  Administered 2020-04-05: 10 mg via ORAL
  Filled 2020-04-04: qty 1

## 2020-04-04 MED ORDER — LACTATED RINGERS IV SOLN: INTRAVENOUS | Status: DC | PRN

## 2020-04-04 MED ORDER — SUMATRIPTAN SUCCINATE 50 MG PO TABS
100.0000 mg | ORAL_TABLET | ORAL | Status: DC | PRN
  Filled 2020-04-04: qty 2

## 2020-04-04 MED ORDER — PROPOFOL 10 MG/ML IV BOLUS
INTRAVENOUS | Status: DC | PRN
  Administered 2020-04-04: 200 mg via INTRAVENOUS

## 2020-04-04 MED ORDER — FENTANYL CITRATE (PF) 250 MCG/5ML IJ SOLN
INTRAMUSCULAR | Status: AC
  Filled 2020-04-04: qty 5

## 2020-04-04 MED ORDER — MORPHINE SULFATE (PF) 2 MG/ML IV SOLN
0.5000 mg | INTRAVENOUS | Status: DC | PRN
  Administered 2020-04-05: 1 mg via INTRAVENOUS
  Filled 2020-04-04: qty 1

## 2020-04-04 MED ORDER — VENLAFAXINE HCL ER 150 MG PO CP24
150.0000 mg | ORAL_CAPSULE | Freq: Every day | ORAL | Status: DC
  Administered 2020-04-05: 150 mg via ORAL
  Filled 2020-04-04: qty 1

## 2020-04-04 MED ORDER — ROCURONIUM BROMIDE 10 MG/ML (PF) SYRINGE
PREFILLED_SYRINGE | INTRAVENOUS | Status: DC | PRN
  Administered 2020-04-04: 70 mg via INTRAVENOUS

## 2020-04-04 MED ORDER — FENTANYL CITRATE (PF) 250 MCG/5ML IJ SOLN
INTRAMUSCULAR | Status: DC | PRN
Start: 2020-04-04 — End: 2020-04-04
  Administered 2020-04-04: 50 ug via INTRAVENOUS
  Administered 2020-04-04: 100 ug via INTRAVENOUS
  Administered 2020-04-04 (×2): 50 ug via INTRAVENOUS

## 2020-04-04 MED ORDER — TERAZOSIN HCL 5 MG PO CAPS
5.0000 mg | ORAL_CAPSULE | Freq: Every evening | ORAL | Status: DC
  Administered 2020-04-04: 5 mg via ORAL
  Filled 2020-04-04 (×2): qty 1

## 2020-04-04 MED ORDER — TRANEXAMIC ACID-NACL 1000-0.7 MG/100ML-% IV SOLN
1000.0000 mg | INTRAVENOUS | Status: AC
  Administered 2020-04-04: 1000 mg via INTRAVENOUS
  Filled 2020-04-04: qty 100

## 2020-04-04 MED ORDER — CHLORHEXIDINE GLUCONATE 0.12 % MT SOLN: 15.0000 mL | Freq: Once | OROMUCOSAL | Status: DC

## 2020-04-04 MED ORDER — LIDOCAINE 2% (20 MG/ML) 5 ML SYRINGE
INTRAMUSCULAR | Status: AC
  Filled 2020-04-04: qty 5

## 2020-04-04 MED ORDER — ONDANSETRON HCL 4 MG/2ML IJ SOLN: 4.0000 mg | Freq: Once | INTRAMUSCULAR | Status: DC | PRN

## 2020-04-04 MED ORDER — PHENYLEPHRINE HCL (PRESSORS) 10 MG/ML IV SOLN
INTRAVENOUS | Status: AC
  Filled 2020-04-04: qty 1

## 2020-04-04 MED ORDER — PROPOFOL 10 MG/ML IV BOLUS
INTRAVENOUS | Status: AC
  Filled 2020-04-04: qty 20

## 2020-04-04 MED ORDER — DEXAMETHASONE SODIUM PHOSPHATE 10 MG/ML IJ SOLN
INTRAMUSCULAR | Status: AC
  Filled 2020-04-04: qty 1

## 2020-04-04 MED ORDER — MAGNESIUM OXIDE 400 (241.3 MG) MG PO TABS
400.0000 mg | ORAL_TABLET | Freq: Every day | ORAL | Status: DC
  Administered 2020-04-05: 400 mg via ORAL
  Filled 2020-04-04: qty 1

## 2020-04-04 MED ORDER — EPHEDRINE SULFATE-NACL 50-0.9 MG/10ML-% IV SOSY
PREFILLED_SYRINGE | INTRAVENOUS | Status: DC | PRN
  Administered 2020-04-04: 5 mg via INTRAVENOUS

## 2020-04-04 MED ORDER — METOCLOPRAMIDE HCL 5 MG PO TABS: 5.0000 mg | ORAL_TABLET | Freq: Three times a day (TID) | ORAL | Status: DC | PRN

## 2020-04-04 MED ORDER — SODIUM CHLORIDE 0.9 % IR SOLN
Status: DC | PRN
  Administered 2020-04-04: 1000 mL

## 2020-04-04 MED ORDER — METOCLOPRAMIDE HCL 5 MG/ML IJ SOLN: 5.0000 mg | Freq: Three times a day (TID) | INTRAMUSCULAR | Status: DC | PRN

## 2020-04-04 MED ORDER — COLESTIPOL HCL 1 G PO TABS
2.0000 g | ORAL_TABLET | Freq: Every day | ORAL | Status: DC
  Filled 2020-04-04: qty 2

## 2020-04-04 MED ORDER — PHENYLEPHRINE HCL-NACL 10-0.9 MG/250ML-% IV SOLN
INTRAVENOUS | Status: DC | PRN
  Administered 2020-04-04: 50 ug/min via INTRAVENOUS

## 2020-04-04 MED ORDER — 0.9 % SODIUM CHLORIDE (POUR BTL) OPTIME
TOPICAL | Status: DC | PRN
  Administered 2020-04-04: 1000 mL

## 2020-04-04 MED ORDER — PHENYLEPHRINE 40 MCG/ML (10ML) SYRINGE FOR IV PUSH (FOR BLOOD PRESSURE SUPPORT)
PREFILLED_SYRINGE | INTRAVENOUS | Status: DC | PRN
  Administered 2020-04-04: 80 ug via INTRAVENOUS
  Administered 2020-04-04: 120 ug via INTRAVENOUS

## 2020-04-04 MED ORDER — ONDANSETRON HCL 4 MG/2ML IJ SOLN
INTRAMUSCULAR | Status: AC
  Filled 2020-04-04: qty 2

## 2020-04-04 MED ORDER — ONDANSETRON HCL 4 MG/2ML IJ SOLN
INTRAMUSCULAR | Status: DC | PRN
  Administered 2020-04-04: 4 mg via INTRAVENOUS

## 2020-04-04 MED ORDER — PHENYLEPHRINE 40 MCG/ML (10ML) SYRINGE FOR IV PUSH (FOR BLOOD PRESSURE SUPPORT)
PREFILLED_SYRINGE | INTRAVENOUS | Status: AC
  Filled 2020-04-04: qty 10

## 2020-04-04 MED ORDER — ONDANSETRON HCL 4 MG/2ML IJ SOLN: 4.0000 mg | Freq: Four times a day (QID) | INTRAMUSCULAR | Status: DC | PRN

## 2020-04-04 MED ORDER — ATORVASTATIN CALCIUM 40 MG PO TABS
40.0000 mg | ORAL_TABLET | Freq: Every evening | ORAL | Status: DC
  Administered 2020-04-04: 40 mg via ORAL
  Filled 2020-04-04: qty 1

## 2020-04-04 MED ORDER — POTASSIUM CHLORIDE CRYS ER 20 MEQ PO TBCR
20.0000 meq | EXTENDED_RELEASE_TABLET | Freq: Every day | ORAL | Status: DC
  Administered 2020-04-05: 20 meq via ORAL
  Filled 2020-04-04: qty 1

## 2020-04-04 MED ORDER — ACETAMINOPHEN 10 MG/ML IV SOLN
1000.0000 mg | Freq: Four times a day (QID) | INTRAVENOUS | Status: DC
  Administered 2020-04-04: 1000 mg via INTRAVENOUS
  Filled 2020-04-04: qty 100

## 2020-04-04 MED ORDER — MENTHOL 3 MG MT LOZG: 1.0000 | LOZENGE | OROMUCOSAL | Status: DC | PRN

## 2020-04-04 MED ORDER — BISACODYL 10 MG RE SUPP: 10.0000 mg | Freq: Every day | RECTAL | Status: DC | PRN

## 2020-04-04 MED ORDER — SUGAMMADEX SODIUM 200 MG/2ML IV SOLN
INTRAVENOUS | Status: DC | PRN
  Administered 2020-04-04: 300 mg via INTRAVENOUS

## 2020-04-04 MED ORDER — ROCURONIUM BROMIDE 10 MG/ML (PF) SYRINGE
PREFILLED_SYRINGE | INTRAVENOUS | Status: AC
  Filled 2020-04-04: qty 10

## 2020-04-04 MED ORDER — PRASUGREL HCL 10 MG PO TABS
10.0000 mg | ORAL_TABLET | Freq: Every day | ORAL | Status: DC
  Administered 2020-04-04 – 2020-04-05 (×2): 10 mg via ORAL
  Filled 2020-04-04 (×2): qty 1

## 2020-04-04 MED ORDER — PHENOL 1.4 % MT LIQD: 1.0000 | OROMUCOSAL | Status: DC | PRN

## 2020-04-04 MED ORDER — ACETAMINOPHEN 325 MG PO TABS: 325.0000 mg | ORAL_TABLET | Freq: Four times a day (QID) | ORAL | Status: DC | PRN

## 2020-04-04 MED ORDER — FENTANYL CITRATE (PF) 100 MCG/2ML IJ SOLN: 25.0000 ug | INTRAMUSCULAR | Status: DC | PRN

## 2020-04-04 MED ORDER — LOSARTAN POTASSIUM 25 MG PO TABS
25.0000 mg | ORAL_TABLET | Freq: Every day | ORAL | Status: DC
  Administered 2020-04-05: 25 mg via ORAL
  Filled 2020-04-04: qty 1

## 2020-04-04 MED ORDER — COLESTIPOL HCL 1 G PO TABS
1.0000 g | ORAL_TABLET | Freq: Every evening | ORAL | Status: DC
  Filled 2020-04-04: qty 1

## 2020-04-04 MED ORDER — LACTATED RINGERS IV SOLN: INTRAVENOUS | Status: DC

## 2020-04-04 MED ORDER — CEFAZOLIN SODIUM-DEXTROSE 2-4 GM/100ML-% IV SOLN
2.0000 g | INTRAVENOUS | Status: AC
  Administered 2020-04-04: 2 g via INTRAVENOUS
  Filled 2020-04-04: qty 100

## 2020-04-04 MED ORDER — ASPIRIN 81 MG PO CHEW
81.0000 mg | CHEWABLE_TABLET | Freq: Two times a day (BID) | ORAL | Status: DC
  Administered 2020-04-04 – 2020-04-05 (×2): 81 mg via ORAL
  Filled 2020-04-04 (×3): qty 1

## 2020-04-04 MED ORDER — BUPIVACAINE HCL (PF) 0.25 % IJ SOLN
INTRAMUSCULAR | Status: AC
  Filled 2020-04-04: qty 30

## 2020-04-04 MED ORDER — DOCUSATE SODIUM 100 MG PO CAPS
100.0000 mg | ORAL_CAPSULE | Freq: Two times a day (BID) | ORAL | Status: DC
  Administered 2020-04-05: 100 mg via ORAL
  Filled 2020-04-04 (×2): qty 1

## 2020-04-04 MED ORDER — ORAL CARE MOUTH RINSE: 15.0000 mL | Freq: Once | OROMUCOSAL | Status: DC

## 2020-04-04 MED ORDER — METHOCARBAMOL 500 MG PO TABS
500.0000 mg | ORAL_TABLET | Freq: Four times a day (QID) | ORAL | Status: DC | PRN
  Administered 2020-04-05: 500 mg via ORAL
  Filled 2020-04-04 (×2): qty 1

## 2020-04-04 MED ORDER — ACYCLOVIR 400 MG PO TABS: 800.0000 mg | ORAL_TABLET | Freq: Every day | ORAL | Status: DC | PRN

## 2020-04-04 SURGICAL SUPPLY — 66 items
BAG DECANTER FOR FLEXI CONT (MISCELLANEOUS) ×2 IMPLANT
BAG SPEC THK2 15X12 ZIP CLS (MISCELLANEOUS) ×2
BAG ZIPLOCK 12X15 (MISCELLANEOUS) ×4 IMPLANT
BIT DRILL 2.8X128 (BIT) ×2 IMPLANT
BLADE SAW SAG 73X25 THK (BLADE)
BLADE SAW SGTL 73X25 THK (BLADE) IMPLANT
COVER SURGICAL LIGHT HANDLE (MISCELLANEOUS) ×2 IMPLANT
COVER WAND RF STERILE (DRAPES) IMPLANT
DRAPE INCISE IOBAN 66X45 STRL (DRAPES) ×4 IMPLANT
DRAPE LAPAROTOMY TRNSV 102X78 (DRAPES) IMPLANT
DRAPE ORTHO SPLIT 77X108 STRL (DRAPES) ×4
DRAPE POUCH INSTRU U-SHP 10X18 (DRAPES) ×2 IMPLANT
DRAPE SURG ORHT 6 SPLT 77X108 (DRAPES) ×2 IMPLANT
DRAPE U-SHAPE 47X51 STRL (DRAPES) ×2 IMPLANT
DRSG AQUACEL AG ADV 3.5X10 (GAUZE/BANDAGES/DRESSINGS) ×2 IMPLANT
DRSG EMULSION OIL 3X16 NADH (GAUZE/BANDAGES/DRESSINGS) ×2 IMPLANT
DRSG MEPILEX BORDER 4X4 (GAUZE/BANDAGES/DRESSINGS) ×4 IMPLANT
DRSG MEPILEX BORDER 4X8 (GAUZE/BANDAGES/DRESSINGS) ×2 IMPLANT
DURAPREP 26ML APPLICATOR (WOUND CARE) ×2 IMPLANT
ELECT REM PT RETURN 15FT ADLT (MISCELLANEOUS) ×2 IMPLANT
EVACUATOR 1/8 PVC DRAIN (DRAIN) ×6 IMPLANT
FACESHIELD WRAPAROUND (MASK) ×8 IMPLANT
GAUZE SPONGE 4X4 12PLY STRL (GAUZE/BANDAGES/DRESSINGS) ×2 IMPLANT
GLOVE BIO SURGEON STRL SZ7 (GLOVE) ×2 IMPLANT
GLOVE BIO SURGEON STRL SZ8 (GLOVE) ×2 IMPLANT
GLOVE BIOGEL PI IND STRL 7.0 (GLOVE) ×1 IMPLANT
GLOVE BIOGEL PI IND STRL 8 (GLOVE) ×1 IMPLANT
GLOVE BIOGEL PI INDICATOR 7.0 (GLOVE) ×1
GLOVE BIOGEL PI INDICATOR 8 (GLOVE) ×1
GOWN STRL REUS W/TWL LRG LVL3 (GOWN DISPOSABLE) ×4 IMPLANT
HANDPIECE INTERPULSE COAX TIP (DISPOSABLE)
HEAD M SROM 36MM PLUS 3 (Hips) ×1 IMPLANT
IMMOBILIZER KNEE 20 (SOFTGOODS)
IMMOBILIZER KNEE 20 THIGH 36 (SOFTGOODS) IMPLANT
KIT BASIN OR (CUSTOM PROCEDURE TRAY) ×2 IMPLANT
KIT TURNOVER KIT A (KITS) IMPLANT
MANIFOLD NEPTUNE II (INSTRUMENTS) ×2 IMPLANT
NDL SAFETY ECLIPSE 18X1.5 (NEEDLE) ×2 IMPLANT
NEEDLE HYPO 18GX1.5 SHARP (NEEDLE) ×4
NS IRRIG 1000ML POUR BTL (IV SOLUTION) ×2 IMPLANT
PACK TOTAL JOINT (CUSTOM PROCEDURE TRAY) ×2 IMPLANT
PASSER SUT SWANSON 36MM LOOP (INSTRUMENTS) IMPLANT
PENCIL SMOKE EVACUATOR COATED (MISCELLANEOUS) ×2 IMPLANT
PIN ESC CONST ACE LINER 36X58 (Liner) ×2 IMPLANT
PROTECTOR NERVE ULNAR (MISCELLANEOUS) ×2 IMPLANT
SET HNDPC FAN SPRY TIP SCT (DISPOSABLE) IMPLANT
SPONGE LAP 18X18 RF (DISPOSABLE) IMPLANT
SROM M HEAD 36MM PLUS 3 (Hips) ×2 IMPLANT
STAPLER VISISTAT 35W (STAPLE) IMPLANT
STRIP CLOSURE SKIN 1/2X4 (GAUZE/BANDAGES/DRESSINGS) ×4 IMPLANT
SUCTION FRAZIER HANDLE 12FR (TUBING) ×2
SUCTION TUBE FRAZIER 12FR DISP (TUBING) ×1 IMPLANT
SUT ETHIBOND NAB CT1 #1 30IN (SUTURE) ×4 IMPLANT
SUT MNCRL AB 3-0 PS2 18 (SUTURE) ×2 IMPLANT
SUT STRATAFIX 0 PDS 27 VIOLET (SUTURE) ×2
SUT VIC AB 2-0 CT1 27 (SUTURE) ×8
SUT VIC AB 2-0 CT1 TAPERPNT 27 (SUTURE) ×4 IMPLANT
SUTURE STRATFX 0 PDS 27 VIOLET (SUTURE) ×1 IMPLANT
SWAB COLLECTION DEVICE MRSA (MISCELLANEOUS) IMPLANT
SWAB CULTURE ESWAB REG 1ML (MISCELLANEOUS) IMPLANT
SYR 30ML LL (SYRINGE) ×2 IMPLANT
SYR 50ML LL SCALE MARK (SYRINGE) ×2 IMPLANT
TOWEL OR 17X26 10 PK STRL BLUE (TOWEL DISPOSABLE) ×4 IMPLANT
TRAY FOLEY MTR SLVR 16FR STAT (SET/KITS/TRAYS/PACK) IMPLANT
WATER STERILE IRR 1000ML POUR (IV SOLUTION) ×4 IMPLANT
YANKAUER SUCT BULB TIP 10FT TU (MISCELLANEOUS) ×2 IMPLANT

## 2020-04-04 NOTE — Brief Op Note (Signed)
04/04/2020  11:24 AM  PATIENT:  Minna Merritts  84 y.o. male  PRE-OPERATIVE DIAGNOSIS:  Recurrent dislocations right total hip  POST-OPERATIVE DIAGNOSIS:  Recurrent dislocations right total hip  PROCEDURE:  Procedure(s) with comments: Right hip acetabular liner revision (Right) -  SURGEON:  Surgeon(s) and Role:    [name], MD - Primary  PHYSICIAN ASSISTANT:   ASSISTANTS: Leilani Able, PA-C   ANESTHESIA:   general  EBL:  200 mL   BLOOD ADMINISTERED:none  DRAINS: (Medium) Hemovact drain(s) in the right hip with  Suction Open   LOCAL MEDICATIONS USED:  MARCAINE     COUNTS:  YES  TOURNIQUET:  * No tourniquets in log *  DICTATION: .Other Dictation: Dictation Number [id number]  PLAN OF CARE: Admit for overnight observation  PATIENT DISPOSITION:  PACU - hemodynamically stable.

## 2020-04-04 NOTE — Transfer of Care (Signed)
Immediate Anesthesia Transfer of Care Note  Patient: Jeffery [name]  Procedure(s) Performed: Right hip acetabular liner revision (Right Hip)  Patient Location: PACU  Anesthesia Type:General  Level of Consciousness: awake, alert , oriented and patient cooperative  Airway & Oxygen Therapy: Patient Spontanous Breathing and Patient connected to face mask oxygen  Post-op Assessment: Report given to RN, Post -op Vital signs reviewed and stable and Patient moving all extremities  Post vital signs: Reviewed and stable  Last Vitals:  Vitals Value Taken Time  BP 154/107 04/04/20 1149  Temp    Pulse 67 04/04/20 1154  Resp 15 04/04/20 1154  SpO2 98 % 04/04/20 1154  Vitals shown include unvalidated device data.  Last Pain:  Vitals:   04/04/20 0907  TempSrc: Oral  PainSc:       Patients Stated Pain Goal: 4 (04/04/20 0851)  Complications: No complications documented.

## 2020-04-04 NOTE — Anesthesia Procedure Notes (Signed)
Procedure Name: Intubation Date/Time: 04/04/2020 10:26 AM Performed by: Lorelee Market, CRNA Pre-anesthesia Checklist: Patient identified, Emergency Drugs available, Suction available and Patient being monitored Patient Re-evaluated:Patient Re-evaluated prior to induction Oxygen Delivery Method: Circle system utilized Preoxygenation: Pre-oxygenation with 100% oxygen Induction Type: IV induction Ventilation: Mask ventilation without difficulty and Oral airway inserted - appropriate to patient size Laryngoscope Size: Glidescope and 4 Grade View: Grade I Tube type: Oral Tube size: 7.5 mm Number of attempts: 1 Airway Equipment and Method: Oral airway and Video-laryngoscopy Placement Confirmation: ETT inserted through vocal cords under direct vision,  positive ETCO2 and breath sounds checked- equal and bilateral Secured at: 25 cm Tube secured with: Tape Dental Injury: Teeth and Oropharynx as per pre-operative assessment

## 2020-04-04 NOTE — Plan of Care (Signed)
  Problem: Education: Goal: Knowledge of General Education information will improve Description: Including pain rating scale, medication(s)/side effects and non-pharmacologic comfort measures Outcome: Progressing   Problem: Health Behavior/Discharge Planning: Goal: Ability to manage health-related needs will improve Outcome: Progressing   Problem: Clinical Measurements: Goal: Ability to maintain clinical measurements within normal limits will improve Outcome: Progressing Goal: Will remain free from infection Outcome: Progressing Goal: Diagnostic test results will improve Outcome: Progressing Goal: Respiratory complications will improve Outcome: Progressing Goal: Cardiovascular complication will be avoided Outcome: Progressing   Problem: Activity: Goal: Risk for activity intolerance will decrease Outcome: Progressing   Problem: Nutrition: Goal: Adequate nutrition will be maintained Outcome: Progressing   Problem: Coping: Goal: Level of anxiety will decrease Outcome: Progressing   Problem: Elimination: Goal: Will not experience complications related to bowel motility Outcome: Progressing Goal: Will not experience complications related to urinary retention Outcome: Progressing   Problem: Pain Managment: Goal: General experience of comfort will improve Outcome: Progressing   Problem: Safety: Goal: Ability to remain free from injury will improve Outcome: Progressing   Problem: Skin Integrity: Goal: Risk for impaired skin integrity will decrease Outcome: Progressing   Problem: Education: Goal: Knowledge of the prescribed therapeutic regimen will improve Outcome: Progressing Goal: Understanding of discharge needs will improve Outcome: Progressing Goal: Individualized Educational Video(s) Outcome: Progressing   Problem: Activity: Goal: Ability to avoid complications of mobility impairment will improve Outcome: Progressing Goal: Ability to tolerate increased  activity will improve Outcome: Progressing   Problem: Pain Management: Goal: Pain level will decrease with appropriate interventions Outcome: Progressing   Problem: Clinical Measurements: Goal: Postoperative complications will be avoided or minimized Outcome: Progressing   Problem: Skin Integrity: Goal: Will show signs of wound healing Outcome: Progressing

## 2020-04-04 NOTE — Progress Notes (Signed)
DR. Linna Caprice PAGED REGARDING PATIENT HYPERTENSION (DYSTOLIC).

## 2020-04-04 NOTE — Evaluation (Signed)
Physical Therapy Evaluation Patient Details Name: Jeffery [name] MRN: [id number] DOB: January 31, 1936 Today's Date: 04/04/2020   History of Present Illness  Patient is 84 y.o. male s/p Rt THR (acetabular liner revision) on 04/04/20 due to repeated dislocation. Patient with PMH significant for HTN, HLD, OA, GERD, Glaucoma, CAD, depression, anxiety, cervical fracture, memory impairment, bil THA.    Clinical Impression  Jeffery [name] is a 84 y.o. male POD 0 s/p Rt THR. Patient reports independence with rollator for mobility at baseline. Patient is now limited by functional impairments (see PT problem list below) and requires min assist/guard for transfers and gait with RW. Patient was able to ambulate ~100 feet with RW and min guard/assist. Patient instructed in exercise to facilitate ROM and circulation to manage edema. Patient will benefit from continued skilled PT interventions to address impairments and progress towards PLOF. Acute PT will follow to progress mobility and stair training in preparation for safe discharge home.     Follow Up Recommendations Follow surgeon's recommendation for DC plan and follow-up therapies    Equipment Recommendations  3in1 (PT)    Recommendations for Other Services       Precautions / Restrictions Precautions Precautions: Fall;Posterior Hip Precaution Booklet Issued: Yes (comment) Restrictions Weight Bearing Restrictions: No Other Position/Activity Restrictions: WBAT      Mobility  Bed Mobility Overal bed mobility: Needs Assistance Bed Mobility: Supine to Sit     Supine to sit: Min assist;HOB elevated     General bed mobility comments: cues for maintaining posterior hip precautions. assist for Rt LE mobility.   Transfers Overall transfer level: Needs assistance Equipment used: Rolling walker (2 wheeled) Transfers: Sit to/from Stand Sit to Stand: Min assist;From elevated surface         General transfer comment: cues for hand placement on RW,  light assit for power up. cues to maintain precautions.  Ambulation/Gait Ambulation/Gait assistance: Min guard Gait Distance (Feet): 100 Feet Assistive device: Rolling walker (2 wheeled) Gait Pattern/deviations: Step-to pattern;Decreased stride length;Decreased weight shift to right Gait velocity: decr   General Gait Details: cues for proximity to RW and pt maintained throughout. no overt LOB noted.   Stairs            Wheelchair Mobility    Modified Rankin (Stroke Patients Only)       Balance Overall balance assessment: Needs assistance Sitting-balance support: Feet supported Sitting balance-Leahy Scale: Good     Standing balance support: During functional activity;Bilateral upper extremity supported Standing balance-Leahy Scale: Fair                               Pertinent Vitals/Pain Pain Assessment: Faces Faces Pain Scale: Hurts little more Pain Location: Rt hip Pain Descriptors / Indicators: Aching;Discomfort Pain Intervention(s): Limited activity within patient's tolerance;Monitored during session;Repositioned;Ice applied    Home Living Family/patient expects to be discharged to:: Private residence Living Arrangements: Spouse/significant other Available Help at Discharge: Family Type of Home: House Home Access: Stairs to enter Entrance Stairs-Rails: None Entrance Stairs-Number of Steps: 1 Home Layout: One level Home Equipment: Walker - 4 wheels;Grab bars - tub/shower;Shower seat;Hand held shower head      Prior Function Level of Independence: Independent with assistive device(s)         Comments: pt uses rollator for mobility and sometime SPC     Hand Dominance   Dominant Hand: Right    Extremity/Trunk Assessment   Upper Extremity Assessment Upper Extremity Assessment:  Overall [facility] for tasks assessed    Lower Extremity Assessment Lower Extremity Assessment: Overall WFL for tasks assessed    Cervical / Trunk  Assessment Cervical / Trunk Assessment: Normal  Communication   Communication: HOH  Cognition Arousal/Alertness: Awake/alert Behavior During Therapy: WFL for tasks assessed/performed Overall Cognitive Status: Within Functional Limits for tasks assessed                                        General Comments      Exercises Total Joint Exercises Ankle Circles/Pumps: AROM;Both;20 reps;Seated   Assessment/Plan    PT Assessment Patient needs continued PT services  PT Problem List Decreased strength;Decreased range of motion;Decreased activity tolerance;Decreased balance;Decreased mobility;Decreased knowledge of use of DME;Decreased knowledge of precautions       PT Treatment Interventions Gait training;DME instruction;Stair training;Functional mobility training;Therapeutic activities;Therapeutic exercise;Balance training;Patient/family education    PT Goals (Current goals can be found in the Care Plan section)  Acute Rehab PT Goals Patient Stated Goal: return home tomorrow PT Goal Formulation: With patient/family Time For Goal Achievement: 04/11/20 Potential to Achieve Goals: Good    Frequency 7X/week   Barriers to discharge        Co-evaluation               AM-PAC PT "6 Clicks" Mobility  Outcome Measure Help needed turning from your back to your side while in a flat bed without using bedrails?: A Little Help needed moving from lying on your back to sitting on the side of a flat bed without using bedrails?: A Little Help needed moving to and from a bed to a chair (including a wheelchair)?: A Little Help needed standing up from a chair using your arms (e.g., wheelchair or bedside chair)?: A Little Help needed to walk in hospital room?: A Little Help needed climbing 3-5 steps with a railing? : A Little 6 Click Score: 18    End of Session Equipment Utilized During Treatment: Gait belt Activity Tolerance: Patient tolerated treatment well Patient  left: in chair;with call bell/phone within reach;with chair alarm set;with family/visitor present Nurse Communication: Mobility status PT Visit Diagnosis: Muscle weakness (generalized) (M62.81);Difficulty in walking, not elsewhere classified (R26.2)    Time: 2355-7322 PT Time Calculation (min) (ACUTE ONLY): 21 min   Charges:   PT Evaluation $PT Eval Low Complexity: 1 Low         Wynn Maudlin, DPT Acute Rehabilitation Services  Office [phone] Pager [phone]  04/04/2020 6:42 PM

## 2020-04-04 NOTE — Interval H&P Note (Signed)
History and Physical Interval Note:  04/04/2020 9:14 AM  Jeffery [name]  has presented today for surgery, with the diagnosis of Recurrent dislocations right total hip.  The various methods of treatment have been discussed with the patient and family. After consideration of risks, benefits and other options for treatment, the patient has consented to  Procedure(s) with comments: Right hip revision to constrained liner (Right) - as a surgical intervention.  The patient's history has been reviewed, patient examined, no change in status, stable for surgery.  I have reviewed the patient's chart and labs.  Questions were answered to the patient's satisfaction.     Homero Fellers [place]

## 2020-04-04 NOTE — Op Note (Signed)
Jeffery [name], CANDELARIA MEDICAL RECORD [id number] ACCOUNT [id number] DATE OF BIRTH:1936-01-14 FACILITY: [facility] LOCATION: [facility] PHYSICIAN:Naeema Patlan Dulcy Fanny, MD  OPERATIVE REPORT  DATE OF PROCEDURE:  04/04/2020  PREOPERATIVE DIAGNOSIS:  Recurrent dislocation, right total hip arthroplasty.  POSTOPERATIVE DIAGNOSIS:  Recurrent dislocation, right total hip arthroplasty.  PROCEDURE:  Right hip acetabular liner revision.  SURGEON:  [name], MD  ASSISTANT:  Leilani Able, PA-C  ANESTHESIA:  General.  ESTIMATED BLOOD LOSS:  200 mL.  DRAINS:  Hemovac x1.  COMPLICATIONS:  None.  CONDITION:  Stable to recovery.  BRIEF CLINICAL NOTE:  The patient is an 84 year old male who had a right total hip arthroplasty many years ago.  He has had about 3 dislocation episodes.  He presents now for revision to a constrained liner versus total hip revision.  PROCEDURE IN DETAIL:  After successful administration of general anesthetic, the patient was placed in the left lateral decubitus position with the right side up and held with a hip positioner.  Right lower extremity was isolated from his perineum with  plastic drapes and prepped and draped in the usual sterile fashion.  Short posterolateral incision was made with a 10 blade through subcutaneous tissue to the level of the fascia lata, which was incised in line with the skin incision.  There was a fair  amount of fluid present in the joint consistent with metal debris.  He had a metal-on-metal hip replacement.  There was also some pseudotumor present and this was all debrided.  There was a rind of abnormal tissue surrounding the joint and I debrided  that back to normal appearing tissue.  Fortunately, there is no muscle damage present or any significant bone loss present.  Hip was then dislocated and the femoral head removed from the femoral neck.  The prosthesis was well fixed and in excellent  position.  The acetabular component was also in  excellent position.  There was no evidence of any corrosion on the femoral neck.  We then retracted the femur anteriorly to gain acetabular exposure.  Acetabular retractors were placed.  I was able to dislodge the metal liner from the Pinnacle acetabular shell.  There was no damage to the shell and it was well fixed.  It is a 58 mm shell.  We thoroughly irrigated the joint with a liter of saline with  pulsatile lavage.  We then placed the constrained liner for the 58 mm cup, into the cup and impacted with excellent purchase.  The femoral head that was removed was a 36+6, so we went to a 36+3 femoral head.  That was impacted onto the femoral neck.  The  hip was then reduced and snapped into position.  The locking ring was then placed around the plastic and impacted with excellent fit.  I placed the hip through range of motion and there was no evidence of any instability.  Wound was then again further  irrigated with saline solution.  The posterior soft tissues were reattached to the femur with Ethibond suture.  Fascia lata was closed over a Hemovac drain with a running 0 Stratafix suture.  Subcutaneous was closed in 2 layers with interrupted 2-0  Vicryl and subcuticular running 3-0 Monocryl.  The drain was hooked to suction.  Incision cleaned and dried and Steri-Strips and a bulky sterile dressing applied.  He was then awakened and transported to recovery in stable condition.  Note that a surgical assistant was of medical necessity for this procedure.  Assistance was necessary for retraction of  vital ligaments and neurovascular structures, also for proper positioning of the limb for safe removal of the old implant and safe and  accurate placement of the new implant.  VN/NUANCE  D:04/04/2020 T:04/04/2020 JOB:[id number]

## 2020-04-05 ENCOUNTER — Encounter (HOSPITAL_COMMUNITY): Payer: Self-pay | Admitting: Orthopedic Surgery

## 2020-04-05 DIAGNOSIS — T84020A Dislocation of internal right hip prosthesis, initial encounter: Secondary | ICD-10-CM | POA: Diagnosis not present

## 2020-04-05 LAB — CBC
HCT: 43 % (ref 39.0–52.0)
Hemoglobin: 14.1 g/dL (ref 13.0–17.0)
MCH: 30.2 pg (ref 26.0–34.0)
MCHC: 32.8 g/dL (ref 30.0–36.0)
MCV: 92.1 fL (ref 80.0–100.0)
Platelets: 194 10*3/uL (ref 150–400)
RBC: 4.67 MIL/uL (ref 4.22–5.81)
RDW: 13.9 % (ref 11.5–15.5)
WBC: 11.1 10*3/uL — ABNORMAL HIGH (ref 4.0–10.5)
nRBC: 0 % (ref 0.0–0.2)

## 2020-04-05 LAB — BASIC METABOLIC PANEL
Anion gap: 10 (ref 5–15)
BUN: 23 mg/dL (ref 8–23)
CO2: 21 mmol/L — ABNORMAL LOW (ref 22–32)
Calcium: 8.4 mg/dL — ABNORMAL LOW (ref 8.9–10.3)
Chloride: 109 mmol/L (ref 98–111)
Creatinine, Ser: 1.1 mg/dL (ref 0.61–1.24)
GFR calc non Af Amer: >60 mL/min (ref >60)
Glucose, Bld: 153 mg/dL — ABNORMAL HIGH (ref 70–99)
Potassium: 3.1 mmol/L — ABNORMAL LOW (ref 3.5–5.1)
Sodium: 140 mmol/L (ref 135–145)

## 2020-04-05 MED ORDER — HYDROCODONE-ACETAMINOPHEN 5-325 MG PO TABS: 1.0000 | ORAL_TABLET | Freq: Four times a day (QID) | ORAL | 0 refills | Status: DC | PRN

## 2020-04-05 MED ORDER — POTASSIUM CHLORIDE CRYS ER 20 MEQ PO TBCR: 20.0000 meq | EXTENDED_RELEASE_TABLET | Freq: Once | ORAL | Status: DC

## 2020-04-05 MED ORDER — METHOCARBAMOL 500 MG PO TABS: 500.0000 mg | ORAL_TABLET | Freq: Four times a day (QID) | ORAL | 0 refills | Status: DC | PRN

## 2020-04-05 NOTE — Progress Notes (Signed)
MD paged by prior shift nurse regarding patient's elevated DBP, no new orders given. Patient is stable, will assess patient and pain level and administer PRNs for pain to help with BP. Marcelle Overlie, RN

## 2020-04-05 NOTE — Progress Notes (Signed)
   Subjective: 1 Day Post-Op Procedure(s) (LRB): Right hip acetabular liner revision (Right) Patient reports pain as mild.   Patient seen in rounds by Dr. Lequita Halt. Patient is well, and has had no acute complaints or problems other than discomfort in the right hip. No acute events overnight. Foley catheter removed, positive flatus. Ambulated 100 feet with PT yesterday. Denies CP, SHOB, N/V.  We will continue therapy today.   Objective: Vital signs in last 24 hours: Temp:  [97.5 F (36.4 C)-98.2 F (36.8 C)] 98.2 F (36.8 C) (10/07 1761) Pulse Rate:  [64-80] 75 (10/07 0613) Resp:  [10-18] 18 (10/07 0613) BP: (121-161)/(87-114) 155/108 (10/07 0613) SpO2:  [91 %-100 %] 95 % (10/07 0613)  Intake/Output from previous day:  Intake/Output Summary (Last 24 hours) at 04/05/2020 0913 Last data filed at 04/05/2020 0600 Gross per 24 hour  Intake 2931.67 ml  Output 2450 ml  Net 481.67 ml     Intake/Output this shift: No intake/output data recorded.  Labs: Recent Labs    04/05/20 0549  HGB 14.1   Recent Labs    04/05/20 0549  WBC 11.1*  RBC 4.67  HCT 43.0  PLT 194   Recent Labs    04/05/20 0311  NA 140  K 3.1*  CL 109  CO2 21*  BUN 23  CREATININE 1.10  GLUCOSE 153*  CALCIUM 8.4*   No results for input(s): LABPT, INR in the last 72 hours.  Exam: General - Patient is Alert and Oriented Extremity - Neurologically intact Sensation intact distally Intact pulses distally Dorsiflexion/Plantar flexion intact Dressing - dressing C/D/I Motor Function - intact, moving foot and toes well on exam.   Past Medical History:  Diagnosis Date  . Anxiety   . BPH (benign prostatic hyperplasia)   . Coronary artery disease   . Depression   . Dyspnea    with exertion   . GERD (gastroesophageal reflux disease)   . Glaucoma   . Hard of hearing   . Headache   . History of pneumonia   . Hyperlipidemia   . Hypertension   . Memory impairment   . Neck fracture (HCC)    2-3/2-21  followed by DR Sandi Carne in [place]   . Nocturia   . Osteoarthritis   . Sleep apnea    wears CPAP does not know setting   . Urinary frequency   . Urinary incontinence   . Wears glasses     Assessment/Plan: 1 Day Post-Op Procedure(s) (LRB): Right hip acetabular liner revision (Right) Principal Problem:   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HCC)  Estimated body mass index is 37.31 kg/m as calculated from the following:   Height as of this encounter: 5\' 10"  (1.778 m).   Weight as of this encounter: 117.9 kg. Advance diet Up with therapy D/C IV fluids  DVT Prophylaxis - Aspirin Weight bearing as tolerated Hip precautions discussed with patient  Plan is to go Home after hospital stay. Plan for discharge today following 1-2 sessions of therapy. Follow up in the office in 2 weeks.   , PA-C Orthopedic Surgery [phone] 04/05/2020, 9:13 AM

## 2020-04-05 NOTE — TOC Transition Note (Signed)
Transition of Care [facility]) - CM/SW Discharge Note   Patient Details  Name: Jeffery [name] MRN: [id number] Date of Birth: 1936-03-11  Transition of Care [facility]) CM/SW Contact:  Lennart Pall, LCSW Phone Number: 04/05/2020, 11:36 AM   Clinical Narrative:     Met with pt yesterday and today to confirm dc needs.  Referrals/ orders placed for [facility] and DME (see below).  Pt ready for d/c today.  No further TOC needs.  Final next level of care: [place] Barriers to Discharge: Barriers Resolved   Patient Goals and CMS Choice Patient states their goals for this hospitalization and ongoing recovery are:: return home CMS Medicare.gov Compare Post Acute Care list provided to:: Patient Choice offered to / list presented to : Patient  Discharge Placement                       Discharge Plan and Services                DME Arranged: 3-N-1, Walker rolling DME Agency: [employer] Date DME Agency Contacted: 04/05/20 Time DME Agency Contacted: 0349 Representative spoke with at DME Agency: Freda Munro HH Arranged: PT [place]: [place] Date [place]: 04/05/20 Time [place]: 1127 Representative spoke with at [place]: [place] ([place]) Interventions     Readmission Risk Interventions No flowsheet data found.

## 2020-04-05 NOTE — Progress Notes (Signed)
Physical Therapy Treatment Patient Details Name: Jeffery [name] MRN: [id number] DOB: 10-Dec-1935 Today's Date: 04/05/2020    History of Present Illness Patient is 84 y.o. male s/p Rt THR (acetabular liner revision) on 04/04/20 due to repeated dislocation. Patient with PMH significant for HTN, HLD, OA, GERD, Glaucoma, CAD, depression, anxiety, cervical fracture, memory impairment, bil THA.    PT Comments    POD #1 R THRevision Hx multiple dislocations Assisted with amb a functional distance.  Returned to room and performed all THR TE's following handout HEP.  Addressed all mobility questions, discussed appropriate activity, educated on use of ICE.  Pt ready for D/C to home.   Follow Up Recommendations  Follow surgeon's recommendation for DC plan and follow-up therapies     Equipment Recommendations       Recommendations for Other Services       Precautions / Restrictions Precautions Precautions: Fall;Posterior Hip Precaution Booklet Issued: Yes (comment) Restrictions Weight Bearing Restrictions: No Other Position/Activity Restrictions: WBAT    Mobility  Bed Mobility   Bed Mobility: Supine to Sit     Supine to sit: Min assist;HOB elevated     General bed mobility comments: cues for maintaining posterior hip precautions. assist for Rt LE mobility.   Transfers Overall transfer level: Needs assistance Equipment used: Rolling walker (2 wheeled) Transfers: Sit to/from Stand           General transfer comment: cues for hand placement on RW, light assit for power up. cues to maintain precautions.  Ambulation/Gait Ambulation/Gait assistance: Min guard Gait Distance (Feet): 75 Feet Assistive device: Rolling walker (2 wheeled) Gait Pattern/deviations: Step-to pattern;Decreased stride length;Decreased weight shift to right Gait velocity: decr   General Gait Details: cues for proximity to RW and pt maintained throughout. no overt LOB noted.    Stairs              Wheelchair Mobility    Modified Rankin (Stroke Patients Only)       Balance                                            Cognition Arousal/Alertness: Awake/alert Behavior During Therapy: WFL for tasks assessed/performed Overall Cognitive Status: Within Functional Limits for tasks assessed                                        Exercises   Total Hip Replacement TE's following HEP Handout 10 reps ankle pumps 05 reps knee presses 05 reps heel slides 05 reps SAQ's 05 reps ABD Instructed how to use a belt loop to assist  Followed by ICE     General Comments        Pertinent Vitals/Pain Pain Assessment: Faces Faces Pain Scale: Hurts little more Pain Location: Rt hip Pain Descriptors / Indicators: Aching;Discomfort Pain Intervention(s): Monitored during session;Repositioned;Premedicated before session;Ice applied    Home Living                      Prior Function            PT Goals (current goals can now be found in the care plan section) Progress towards PT goals: Progressing toward goals    Frequency    7X/week      PT Plan Current plan remains  appropriate    Co-evaluation              AM-PAC PT "6 Clicks" Mobility   Outcome Measure  Help needed turning from your back to your side while in a flat bed without using bedrails?: A Little Help needed moving from lying on your back to sitting on the side of a flat bed without using bedrails?: A Little Help needed moving to and from a bed to a chair (including a wheelchair)?: A Little Help needed standing up from a chair using your arms (e.g., wheelchair or bedside chair)?: A Little Help needed to walk in hospital room?: A Little Help needed climbing 3-5 steps with a railing? : A Little 6 Click Score: 18    End of Session Equipment Utilized During Treatment: Gait belt Activity Tolerance: Patient tolerated treatment well Patient left: in chair;with  call bell/phone within reach;with chair alarm set;with family/visitor present Nurse Communication: Mobility status PT Visit Diagnosis: Muscle weakness (generalized) (M62.81);Difficulty in walking, not elsewhere classified (R26.2)     Time: 6962-9528 PT Time Calculation (min) (ACUTE ONLY): 28 min  Charges:  $Gait Training: 8-22 mins $Therapeutic Exercise: 8-22 mins                     {Cage Gupton  PTA Acute  Rehabilitation Services Pager      [phone] Office      [phone]

## 2020-04-06 NOTE — Anesthesia Postprocedure Evaluation (Signed)
Anesthesia Post Note  Patient: Jeffery [name]  Procedure(s) Performed: Right hip acetabular liner revision (Right Hip)     Patient location during evaluation: PACU Anesthesia Type: Spinal Level of consciousness: awake Pain management: pain level controlled Vital Signs Assessment: post-procedure vital signs reviewed and stable Respiratory status: spontaneous breathing Cardiovascular status: stable Postop Assessment: no headache, no backache, spinal receding, patient able to bend at knees and no apparent nausea or vomiting Anesthetic complications: no   No complications documented.  Last Vitals:  Vitals:   04/05/20 1004 04/05/20 1006  BP: (!) 176/116 (!) 157/106  Pulse: 65 67  Resp: 18   Temp: 37.1 C   SpO2: 98%     Last Pain:  Vitals:   04/05/20 1004  TempSrc: Oral  PainSc:    Pain Goal: Patients Stated Pain Goal: 3 (04/05/20 0100)                 Caren Macadam

## 2020-07-24 ENCOUNTER — Ambulatory Visit (INDEPENDENT_AMBULATORY_CARE_PROVIDER_SITE_OTHER): Payer: Medicare HMO | Admitting: Family

## 2020-07-24 ENCOUNTER — Encounter: Payer: Self-pay | Admitting: Family

## 2020-07-24 ENCOUNTER — Other Ambulatory Visit: Payer: Self-pay

## 2020-07-24 VITALS — BP 130/80 | HR 74 | Temp 97.8°F | Resp 18 | Ht 70.0 in | Wt 275.0 lb

## 2020-07-24 DIAGNOSIS — Z87891 Personal history of nicotine dependence: Secondary | ICD-10-CM

## 2020-07-24 DIAGNOSIS — J984 Other disorders of lung: Secondary | ICD-10-CM

## 2020-07-24 DIAGNOSIS — N529 Male erectile dysfunction, unspecified: Secondary | ICD-10-CM

## 2020-07-24 DIAGNOSIS — R159 Full incontinence of feces: Secondary | ICD-10-CM

## 2020-07-24 DIAGNOSIS — E669 Obesity, unspecified: Secondary | ICD-10-CM

## 2020-07-24 DIAGNOSIS — R338 Other retention of urine: Secondary | ICD-10-CM

## 2020-07-24 DIAGNOSIS — E785 Hyperlipidemia, unspecified: Secondary | ICD-10-CM | POA: Diagnosis not present

## 2020-07-24 DIAGNOSIS — I2510 Atherosclerotic heart disease of native coronary artery without angina pectoris: Secondary | ICD-10-CM

## 2020-07-24 DIAGNOSIS — E538 Deficiency of other specified B group vitamins: Secondary | ICD-10-CM

## 2020-07-24 DIAGNOSIS — E559 Vitamin D deficiency, unspecified: Secondary | ICD-10-CM

## 2020-07-24 DIAGNOSIS — E210 Primary hyperparathyroidism: Secondary | ICD-10-CM

## 2020-07-24 DIAGNOSIS — K219 Gastro-esophageal reflux disease without esophagitis: Secondary | ICD-10-CM

## 2020-07-24 DIAGNOSIS — B009 Herpesviral infection, unspecified: Secondary | ICD-10-CM

## 2020-07-24 DIAGNOSIS — F411 Generalized anxiety disorder: Secondary | ICD-10-CM

## 2020-07-24 DIAGNOSIS — N401 Enlarged prostate with lower urinary tract symptoms: Secondary | ICD-10-CM

## 2020-07-24 DIAGNOSIS — Z6839 Body mass index (BMI) 39.0-39.9, adult: Secondary | ICD-10-CM

## 2020-07-24 DIAGNOSIS — R42 Dizziness and giddiness: Secondary | ICD-10-CM

## 2020-07-24 DIAGNOSIS — G43709 Chronic migraine without aura, not intractable, without status migrainosus: Secondary | ICD-10-CM

## 2020-07-24 DIAGNOSIS — G8929 Other chronic pain: Secondary | ICD-10-CM

## 2020-07-24 DIAGNOSIS — I10 Essential (primary) hypertension: Secondary | ICD-10-CM | POA: Insufficient documentation

## 2020-07-24 DIAGNOSIS — G4733 Obstructive sleep apnea (adult) (pediatric): Secondary | ICD-10-CM

## 2020-07-24 DIAGNOSIS — F331 Major depressive disorder, recurrent, moderate: Secondary | ICD-10-CM

## 2020-07-24 DIAGNOSIS — M25511 Pain in right shoulder: Secondary | ICD-10-CM

## 2020-07-24 DIAGNOSIS — Z9989 Dependence on other enabling machines and devices: Secondary | ICD-10-CM

## 2020-07-24 NOTE — Progress Notes (Signed)
Provider: Nami Strawder FNP-C   Francesca Oman, DO  Patient Care Team: Francesca Oman, DO as PCP - General (Internal Medicine)  Extended Emergency Contact Information Primary Emergency Contact: [facility] Address: [address] [name], [name] [address] [name] of [place] Phone: [phone] Mobile Phone: [phone] Relation: Spouse Secondary Emergency Contact: [name] Address: [address]          [name], [name] [address] [name] of [place] Phone: [phone] Mobile Phone: [phone] Relation: Daughter  Code Status:  Full Code  Goals of care: Advanced Directive information Advanced Directives 07/24/2020  Does Patient Have a Medical Advance Directive? Yes  Type of Advance Directive Living will  Does patient want to make changes to medical advance directive? No - Patient declined  Copy of [place] in Chart? -     Chief Complaint  Patient presents with  . Establish Care    New Patient.    HPI:  Pt is a 85 y.o. male seen today to establish care at [facility] for medical management of chronic diseases. He is here with wife.Has an extensive medical history of Hypertension,Coronary artery disease 06/14/2019,Hyperlipidemia,Migraine,chronic dizziness,C 1 anterior arch fracture,akylosing spondylitis of the cervical spine ,neck pain,major Depression,generalized anxiety,Peripheral Neuropathy5/28/2015,Lumbar stenosis 06/04/2015,pseudoptosis both eyes,spinal hardware,Obstructive sleep apnea on CPAP,GERD,Hearing loss,dyspnea,Obesity,BPH 11/24/2013,Retina detachment 2004 among other conditions.EF 50 -55% ( 01/10/2020) with mild MR /TR and moderate PR and mild pulmonary HTN secondary to his lung disease and mild valvular disease.  Has had bilateral hip and knee replacement.Also right reverse total shoulder arthroplasty.He is status post spinal cord stimulator implant 08/12/2017.follows up with Dr.Frank Aluisio at  [facility].  He states has chronic dizziness and Headache that debilitate.takes Maxalt and Tylenol.Has had MRI follows up with Neurologist Dr.Tonuzi Lirim at [facility] in [place].Headache is relieved by sitting in recliner and using heating pad. ice bag also helps.He describes headache as sharp located on left temporal to the back of the neck.Has previous neck injury 1994.On rizatriptan 10 mg tablet as needed and Tylenol 500 mg every 6 hrs as needed. Had CT scan cervical spine 09/17/2019 which showed auto fusion,ankylosing spondylitis.ROM exercises with Physical Therapy was recommended 05/01/2020.  His dizziness was also thought to be possible cervicogenic vertigo or anxiety.No vertigo or Orthostasis.Dizziness described as sense of imbalance.Dizziness improves when not moving.He follows up with Neurologist at [facility] in [place] Dr.L Tonuzi Advised to continue on Venlafaxine or explore other options of SNRIs.His Nortriptyline was discontinued.He was advised to follow up in 4 months with plans to start on prestiq or Cymbalta.His Effexor dose was increased from 150 mg to 225 mg daily.had no benefit from dose adjustment.He has seen ENT Dr.Moore an MRI of the brain was recommended but unable to due to spinal hardware.Had no benefit with head repositioning maneurs.No benefits with tizanidine and Gabapentin.He denies any vision changes.  CAD- denies any chest pain.on Prasugrel 10 mg tablet daily, ASA 81 mg tablet daily and atorvastatin 40 mg tablet daily.follows up with [facility] Cardiologist at [place] [facility] with Dr.Rohrbeck [facility].  Hypertension - No home blood pressure readings for review.B/p today is 130's/80's at baseline on available chart records.on Amlodipine 10 mg daily and losartan 25 mg tablet daily    Hyperlipidemia - on atorvastatin 40 mg tablet daily.Does not exercise,wife states used to go together to silver sneakers exercises but now days sits most of  the time. Appetite is great.  GERD - symptoms intermittent.uses Omeprazole 20 mg capsule daily as needed.reports no blood or dark stool.  Glaucoma - follows up with Ophthalmology.On latanoprost and timolol eye drops.   Depression - on Effexor-XR 225 mg tablet daily.states feels lonely since most of his friends have died.Most relatives are out of states.they speak over the phone.Has been following up with [facility] though thinks not helping since just talks with Therapist just like any other person or a friend.Wife states attended session with husband but thinks he does not open up to Therapist to address main issues bothering him.Had follow up appointment but did not go due to surgery. He is willing to give another try.  ED worsening his symptoms.Requesting viagra discussed waiting for evaluation of CAD with cardiologist prior to using viagra  Anxiety - on Buspirone 5 mg tablet twice daily.    Vitamin B12  Deficiency - no latest level for review.on Vitamin B 12 1000 mcg tablet daily.   BPH - gets 4 times at night.follows up with Urologist Dr.Marshall Jonette Eva at [facility] urology in [facility].states prostate MRI was ordered but was unable to be done due to his spinal hardware.His previous biopsy x 4 have been negative done last 03/2019 and 05/2020.His latest PSA was high 9 he was advised to continue on Avodart and Hytrin.    Hyperparathyroidism - follows up with Endocrinologist Dr.Patel Dhaval at [facility]  last seen 07/09/2020.PTH level slightly high in August,2021.currently on calcium carbonate tums daily and Vitamin D 1000 units daily supplement   Blisters on gluteal - herpes simplex on Acyclovir 800 mg tablet daily PRN.No recent flare ups.  OSA - CPAP at night.Pulomonologist ejez   Restrictive Lung Disease - seen by Pulmonologist Dr.Muhammad Earley Abide at [facility] 09/01/2019 had normal CXR and PFTs showing pulmonary edema with FVC of 78%.CXR showed cardiomegaly pulmonary  edema.He was advised to continue with Furosemide,watching fluid and sodium intake.He was advised to continue on Spiriva.current off Spiriva.On mometasone 220 mcg /INH inhaler 2 puffs daily at bedtime.States breathing has improved except dyspnea with ambulation. On Furosemide 20 mg tablet daily along with Potassium chloride 20 meq daily.  Hx of smoking cigars. quit several year ago.   Past Medical History:  Diagnosis Date  . Anxiety   . BPH (benign prostatic hyperplasia)   . Coronary artery disease   . Depression   . Dyspnea    with exertion   . Fecal incontinence   . GERD (gastroesophageal reflux disease)   . Glaucoma   . Hard of hearing   . Headache   . History of pneumonia   . Hyperlipidemia   . Hypertension   . Memory impairment   . Neck fracture ([place])    2-3/2-21 followed by DR Ashok Croon in [place]   . Nocturia   . Osteoarthritis   . Sleep apnea    wears CPAP does not know setting   . Urinary frequency   . Urinary incontinence   . Wears glasses    Past Surgical History:  Procedure Laterality Date  . CARDIAC CATHETERIZATION    . CIRCUMCISION    . COLONOSCOPY    . CORONARY ANGIOPLASTY     stents- 08/2019   . ESOPHAGOGASTRODUODENOSCOPY    . EYE SURGERY Bilateral    cataract  . HAND SURGERY Right   . HIP ARTHROPLASTY Right   . HIP ARTHROPLASTY Left 2006  . KNEE CARTILAGE SURGERY Bilateral   . LUMBAR LAMINECTOMY     L3 - L4  .  RETINAL DETACHMENT SURGERY Right    x2  . REVERSE SHOULDER ARTHROPLASTY Right 05/29/2016   Procedure: REVERSE SHOULDER ARTHROPLASTY;  Surgeon: Justice Britain, MD;  Location: [facility];  Service: Orthopedics;  Laterality: Right;  . SHOULDER SURGERY Bilateral   . TOTAL HIP REVISION Right 04/04/2020   Procedure: Right hip acetabular liner revision;  Surgeon: Gaynelle Arabian, MD;  Location: [facility];  Service: Orthopedics;  Laterality: Right;  18mn    No Known Allergies  Allergies as of 07/24/2020   No Known Allergies     Medication List        Accurate as of July 24, 2020  2:05 PM. If you have any questions, ask your nurse or doctor.        STOP taking these medications   sertraline 50 MG tablet Commonly known as: ZOLOFT Stopped by: DSandrea Hughs NP   topiramate 50 MG tablet Commonly known as: TOPAMAX Stopped by: DSandrea Hughs NP     TAKE these medications   acetaminophen 500 MG tablet Commonly known as: TYLENOL Take 500 mg by mouth every 6 (six) hours as needed.   acyclovir 800 MG tablet Commonly known as: ZOVIRAX Take 800 mg by mouth daily as needed (shingles).   amLODipine 10 MG tablet Commonly known as: NORVASC Take 10 mg by mouth daily.   aspirin 81 MG tablet Take 81 mg by mouth 2 (two) times daily.   atorvastatin 40 MG tablet Commonly known as: LIPITOR Take 40 mg by mouth every evening.   busPIRone 5 MG tablet Commonly known as: BUSPAR Take 5 mg by mouth 2 (two) times daily.   colestipol 1 g tablet Commonly known as: COLESTID Take 1 g by mouth 2 (two) times daily.   dutasteride 0.5 MG capsule Commonly known as: AVODART Take 0.5 mg by mouth daily.   furosemide 20 MG tablet Commonly known as: LASIX Take 20 mg by mouth daily.   gabapentin 300 MG capsule Commonly known as: NEURONTIN Take 300-600 mg by mouth 2 (two) times daily. 600 mg in the morning and 300 mg in the evening   HYDROcodone-acetaminophen 5-325 MG tablet Commonly known as: NORCO/VICODIN Take 1-2 tablets by mouth every 6 (six) hours as needed for severe pain.   latanoprost 0.005 % ophthalmic solution Commonly known as: XALATAN Place 1 drop into both eyes at bedtime.   losartan 25 MG tablet Commonly known as: COZAAR Take 25 mg by mouth daily.   magnesium oxide 400 MG tablet Commonly known as: MAG-OX Take 400 mg by mouth daily.   methocarbamol 500 MG tablet Commonly known as: ROBAXIN Take 1 tablet (500 mg total) by mouth every 6 (six) hours as needed for muscle spasms.   mometasone 220 MCG/INH  inhaler Commonly known as: ASMANEX Inhale 2 puffs into the lungs at bedtime.   omeprazole 20 MG capsule Commonly known as: PRILOSEC Take 20 mg by mouth daily as needed (acid reflux).   ondansetron 4 MG tablet Commonly known as: Zofran Take 1 tablet (4 mg total) by mouth every 8 (eight) hours as needed for nausea or vomiting.   potassium chloride SA 20 MEQ tablet Commonly known as: KLOR-CON Take 20 mEq by mouth daily.   prasugrel 10 MG Tabs tablet Commonly known as: EFFIENT Take 10 mg by mouth daily.   PRESCRIPTION MEDICATION Inhale 1 puff into the lungs at bedtime. Unknown inhaler (patient states he will bring with him to his upcoming pre operative appointment)   PRESERVISION AREDS PO Take 1 capsule  by mouth in the morning and at bedtime.   PROBIOTIC PO Take 1 capsule by mouth daily.   rizatriptan 10 MG tablet Commonly known as: MAXALT Take 10 mg by mouth as needed for migraine. May repeat in 2 hours if needed   terazosin 5 MG capsule Commonly known as: HYTRIN Take 5 mg by mouth every evening.   timolol 0.5 % ophthalmic solution Commonly known as: TIMOPTIC Place 1 drop into both eyes 2 (two) times daily.   tiZANidine 4 MG tablet Commonly known as: ZANAFLEX Take 4 mg by mouth in the morning and at bedtime.   TUMS PO Take by mouth.   venlafaxine XR 75 MG 24 hr capsule Commonly known as: EFFEXOR-XR Take 225 mg by mouth daily with breakfast.   vitamin B-12 1000 MCG tablet Commonly known as: CYANOCOBALAMIN Take 1,000 mcg by mouth daily.   Vitamin D 50 MCG (2000 UT) Caps Take 2,000 Units by mouth daily.       Review of Systems  Constitutional: Negative for chills, fatigue and fever.  HENT: Positive for hearing loss. Negative for congestion, postnasal drip, rhinorrhea, sinus pressure, sinus pain, sneezing, sore throat and trouble swallowing.        Wears hearing aids since 85 yrs old [facility]   Eyes: Positive for visual disturbance. Negative for  pain, redness and itching.       Wears eye glasses Glaucoma follows up with Dr.joseph Sharen Counter  Left eye corner irritated   Respiratory: Negative for cough, chest tightness and wheezing.        Shortness of breath with exertion  CPAP dose not use at night.sleeps on right side  Cardiovascular: Positive for leg swelling. Negative for palpitations.       Muscle tense no chest pain   Gastrointestinal: Negative for abdominal distention, abdominal pain, constipation, diarrhea, nausea and vomiting.       GERD worst with lying  Fecal incontinence resolved with colestipod Hemorroid  Endocrine: Negative for cold intolerance, heat intolerance, polydipsia, polyphagia and polyuria.       Thyroidectomy follows up yearly with Endocrinologist   Genitourinary: Positive for frequency. Negative for difficulty urinating, dysuria and flank pain.       BPH gets up 4 X at night  ED follow urologist DR.Hall   Musculoskeletal: Positive for arthralgias, back pain and gait problem. Negative for joint swelling, myalgias, neck pain and neck stiffness.       Implant on the back has had epidural  Hx of neck injury hit piece of equipment while working with [employer]   Skin: Negative for color change, pallor, rash and wound.  Neurological: Positive for dizziness and headaches. Negative for speech difficulty, weakness and numbness.  Hematological: Does not bruise/bleed easily.  Psychiatric/Behavioral: Negative for agitation, behavioral problems and confusion. The patient is nervous/anxious.        Depression worst     Immunization History  Administered Date(s) Administered  . Influenza Split 04/30/2002, 04/12/2003, 04/05/2004, 04/30/2005, 04/21/2006, 05/03/2007, 03/30/2008, 03/30/2009, 03/27/2010, 05/07/2011, 03/30/2012, 02/28/2013, 05/31/2014, 04/13/2016, 05/13/2016, 04/24/2017, 04/19/2018  . Influenza, High Dose Seasonal PF 03/05/2015, 03/05/2015, 05/13/2016, 04/24/2017, 04/27/2018, 03/23/2019  . Influenza,  Seasonal, Injecte, Preservative Fre 04/19/2018  . Influenza-Unspecified 04/13/2016  . Moderna Sars-Covid-2 Vaccination 07/13/2019, 08/10/2019, 04/27/2020  . Pneumococcal Conjugate-13 04/12/2003, 10/18/2010, 01/22/2012, 12/22/2013  . Pneumococcal Polysaccharide-23 11/22/2004  . Tdap 02/28/2004, 08/31/2014, 09/08/2016, 06/21/2020  . Zoster 07/30/2011  . Zoster Recombinat (Shingrix) 12/27/2018, 05/11/2019   Pertinent  Health Maintenance Due  Topic Date  Due  . INFLUENZA VACCINE  01/29/2020  . PNA vac Low Risk Adult  Completed   Fall Risk  07/24/2020  Falls in the past year? 0  Number falls in past yr: 0  Injury with Fall? 0   Functional Status Survey:    Vitals:   07/24/20 1343  BP: 130/80  Pulse: 74  Resp: 18  Temp: 97.8 F (36.6 C)  SpO2: 94%  Weight: 275 lb (124.7 kg)  Height: $Remove'5\' 10"'HwnKPKV$  (1.778 m)   Body mass index is 39.46 kg/m. Physical Exam Vitals reviewed.  Constitutional:      General: He is not in acute distress.    Appearance: He is obese. He is not ill-appearing.  HENT:     Head: Normocephalic.     Right Ear: Tympanic membrane, ear canal and external ear normal. There is no impacted cerumen.     Left Ear: Tympanic membrane, ear canal and external ear normal. There is no impacted cerumen.     Nose: Nose normal. No congestion or rhinorrhea.     Mouth/Throat:     Mouth: Mucous membranes are moist.     Pharynx: Oropharynx is clear. No oropharyngeal exudate or posterior oropharyngeal erythema.  Eyes:     General: No scleral icterus.       Right eye: No discharge.        Left eye: No discharge.     Extraocular Movements: Extraocular movements intact.     Conjunctiva/sclera: Conjunctivae normal.     Pupils: Pupils are equal, round, and reactive to light.  Neck:     Vascular: No carotid bruit.  Cardiovascular:     Rate and Rhythm: Normal rate and regular rhythm.     Pulses: Normal pulses.     Heart sounds: Normal heart sounds. No murmur heard. No friction rub.  No gallop.   Pulmonary:     Effort: Pulmonary effort is normal. No respiratory distress.     Breath sounds: Normal breath sounds. No wheezing, rhonchi or rales.     Comments: Slight dyspneic with exertion at baseline.  Chest:     Chest wall: No tenderness.  Abdominal:     General: Bowel sounds are normal. There is no distension.     Palpations: Abdomen is soft. There is no mass.     Tenderness: There is no abdominal tenderness. There is no right CVA tenderness, left CVA tenderness, guarding or rebound.  Musculoskeletal:        General: No swelling.     Right shoulder: No swelling, effusion, tenderness or crepitus. Decreased range of motion. Normal strength. Normal pulse.     Left shoulder: Normal.       Arms:     Cervical back: No edema, erythema, rigidity, torticollis or crepitus. No pain with movement, spinous process tenderness or muscular tenderness. Decreased range of motion.     Right lower leg: No edema.     Left lower leg: No edema.     Comments: Unsteady gait ambulates with a cane   Lymphadenopathy:     Cervical: No cervical adenopathy.  Skin:    General: Skin is warm and dry.     Coloration: Skin is not pale.     Findings: No bruising, erythema, lesion or rash.  Neurological:     Mental Status: He is oriented to person, place, and time.     Cranial Nerves: No cranial nerve deficit.     Sensory: No sensory deficit.     Motor: No weakness.  Coordination: Coordination normal.     Gait: Gait abnormal.  Psychiatric:        Mood and Affect: Mood is depressed. Mood is not anxious or elated. Affect is not angry, tearful or inappropriate.        Speech: Speech normal.        Behavior: Behavior normal.        Thought Content: Thought content normal.        Judgment: Judgment normal.     Labs reviewed: Recent Labs    03/26/20 1441 04/05/20 0311  NA 140 140  K 3.7 3.1*  CL 110 109  CO2 23 21*  GLUCOSE 102* 153*  BUN 19 23  CREATININE 1.01 1.10  CALCIUM 8.8*  8.4*   Recent Labs    03/26/20 1441  AST 15  ALT 12  ALKPHOS 69  BILITOT 0.7  PROT 6.7  ALBUMIN 3.8   Recent Labs    03/26/20 1441 04/05/20 0549  WBC 8.1 11.1*  HGB 13.5 14.1  HCT 41.0 43.0  MCV 93.6 92.1  PLT 135* 194   No results found for: TSH No results found for: HGBA1C No results found for: CHOL, HDL, LDLCALC, LDLDIRECT, TRIG, CHOLHDL  Significant Diagnostic Results in last 30 days:  No results found.  Assessment/Plan  1. Essential hypertension B/p at goal. - continue on amlodipine 10 mg tablet daily,losartan 25 mg tablet daily,furosemide 20 mg tablet daily and Terazosin 5 mg tablet daily - continue on ASA and Statin for CV event prevention.  - CBC with Differential/Platelet - CMP with eGFR([facility])  2. Hyperlipidemia LDL goal <100 No recent LDL for review. - continue on atorvastatin 40 mg tablet daily. - dietary modification to low carbohydrates,low saturated fats and high vegetable diet discussed with patient and wife during visit.Also recommended exercise at least three times per week x 30 minutes as tolerated.  - Lipid panel  3. Gastroesophageal reflux disease without esophagitis Symptoms under control on Omeprazole 20 mg capsule.Advised to avoid eating heavy meals 2 hrs before bedtime. Also to avoid spicy and other aggravating foods.   4. Benign prostatic hyperplasia with urinary retention Continue on terazosin 5 mg tablet and Avodart 0.5 mg tablet daily  - continue to follow up with Urologist Dr.Hall Maryan Puls.   5. OSA on CPAP -continue on CPAP - life style modification as above  6. Dizziness Unclear etiology though possible related to headaches/neck pain from previous injuries in 1994.No orthostatics noted. - continue to follow up with Neurologist   7. Moderate episode of recurrent major depressive disorder (HCC) Continue on venlafaxine XR 225 mg capsule daily.  - Advised to follow up with Psychiatrist as directed on previous visit.Will  benefit from seeing a counselor.Erectile dysfunction and his chronic medical issues worsening his symptoms too. - dietary and lifestyle modification advised during visit to improve his  Mood.  - will add Cymbalta if symptoms worsen   8. Vitamin B12 deficiency No recent levels for review. - continue on vitamin B 12 1000 mcg tablet daily. - Vitamin B12  9. Vitamin D deficiency Continue on vitamin D 2000 units daily  - Vitamin D, 1,25-dihydroxy  10. Generalized anxiety disorder Stable. Continue on Buspar 5 mg tablet twice daily.  11. Erectile dysfunction, unspecified erectile dysfunction type Request Viagra.Has upcoming appointment with Cardiologist for clearance for use of viagra given hx  CAD.  12. Herpes simplex Asymptomatic. - reports recurrent on Gluteal folds but none today. - continue on Acyclovir 800 mg tablet daily as  needed   13. Obesity (BMI 30-39.9) - low carbohydrates,low saturated fats and high vegetable diet recommended along with exercise at least three times per week for 30 minutes as tolerated.   14. BMI 39.0-39.9,adult BMI 39.46  - dietary and exercise modification as above.   15. Functional fecal incontinence Resolved with use of colestipol 1 Gm tablet twice daily.  Continue on probiotics 1 capsule daily.   16. Coronary artery disease involving native coronary artery of native heart, unspecified whether angina present Chest pain free. S/p stent 08/2019 coronary angioplasty. - dietary and lifestyle modification as above. - continue on ASA 81 mg tablet daily,prasugrel 10 mg tablet daily and Atorvastatin 40 mg tablet daily. - continue to follow up with Cardiologist.  17. Chronic right shoulder pain Chronic.S/p reverse shoulder Arthroplasty 11/30/20217  - continue on Tylenol 500 mg every 6 hrs as needed.  18. Chronic migraine without aura without status migrainosus, not intractable rizatriptan 10 mg tablet as needed and Tylenol 500 mg every 6 hrs as  needed.   19. Restrictive lung disease - breathing stable  - continue on mometasone 220 mcg /INH inhaler 2 puffs daily at bedtime. - continue on Furosemide 20 mg tablet daily along with Potassium chloride 20 meq daily. - continue to follow up with Pulmonologist   20. Former cigar smoker Quit several years ago.  21. Primary hyperparathyroidism ([place]) Continue to follow up with Endocrinologist Dr.Patel Dhaval last seen 07/09/2020  - continue on Vitamin D and Calcium supplement  - repeat PTH and calcium level to be managed by Endocrinologist  - CMP with eGFR([facility]) - Vitamin D, 1,25-dihydroxy    Family/ staff Communication: Reviewed plan of care with patient and wife verbalized understanding.   Labs/tests ordered:  - CBC with Differential/Platelet - CMP with eGFR([facility]) - Vitamin D, 1,25-dihydroxy - Lipid panel  - Vitamin B12  Next Appointment : 4 months for medical management of chronic issues.  Time spent with patient 60 minutes >50% time spent counseling; reviewing extensive  medical history / record; tests; labs; and developing future plan of care    Sandrea Hughs, NP

## 2020-07-24 NOTE — Patient Instructions (Signed)

## 2020-07-26 ENCOUNTER — Other Ambulatory Visit: Payer: Self-pay

## 2020-07-26 DIAGNOSIS — E785 Hyperlipidemia, unspecified: Secondary | ICD-10-CM

## 2020-07-28 LAB — COMPLETE METABOLIC PANEL WITH GFR
AG Ratio: 1.4 (calc) (ref 1.0–2.5)
ALT: 20 U/L (ref 9–46)
AST: 23 U/L (ref 10–35)
Albumin: 3.9 g/dL (ref 3.6–5.1)
Alkaline phosphatase (APISO): 79 U/L (ref 35–144)
BUN: 18 mg/dL (ref 7–25)
CO2: 25 mmol/L (ref 20–32)
Calcium: 9.3 mg/dL (ref 8.6–10.3)
Chloride: 106 mmol/L (ref 98–110)
Creat: 1.05 mg/dL (ref 0.70–1.11)
GFR, Est African American: 76 mL/min/{1.73_m2} (ref >=60)
GFR, Est Non African American: 66 mL/min/{1.73_m2} (ref >=60)
Globulin: 2.8 g/dL (calc) (ref 1.9–3.7)
Glucose, Bld: 75 mg/dL (ref 65–139)
Potassium: 3.9 mmol/L (ref 3.5–5.3)
Sodium: 141 mmol/L (ref 135–146)
Total Bilirubin: 0.5 mg/dL (ref 0.2–1.2)
Total Protein: 6.7 g/dL (ref 6.1–8.1)

## 2020-07-28 LAB — LIPID PANEL
Cholesterol: 158 mg/dL (ref <200)
HDL: 35 mg/dL — ABNORMAL LOW (ref >=40)
LDL Cholesterol (Calc): 89 mg/dL (calc)
Non-HDL Cholesterol (Calc): 123 mg/dL (calc) (ref <130)
Total CHOL/HDL Ratio: 4.5 (calc) (ref <5.0)
Triglycerides: 260 mg/dL — ABNORMAL HIGH (ref <150)

## 2020-07-28 LAB — CBC WITH DIFFERENTIAL/PLATELET
Absolute Monocytes: 721 cells/uL (ref 200–950)
Basophils Absolute: 70 cells/uL (ref 0–200)
Basophils Relative: 1 %
Eosinophils Absolute: 210 cells/uL (ref 15–500)
Eosinophils Relative: 3 %
HCT: 44.2 % (ref 38.5–50.0)
Hemoglobin: 15.1 g/dL (ref 13.2–17.1)
Lymphs Abs: 1379 cells/uL (ref 850–3900)
MCH: 31 pg (ref 27.0–33.0)
MCHC: 34.2 g/dL (ref 32.0–36.0)
MCV: 90.8 fL (ref 80.0–100.0)
MPV: 10.9 fL (ref 7.5–12.5)
Monocytes Relative: 10.3 %
Neutro Abs: 4620 cells/uL (ref 1500–7800)
Neutrophils Relative %: 66 %
Platelets: 177 10*3/uL (ref 140–400)
RBC: 4.87 10*6/uL (ref 4.20–5.80)
RDW: 13.1 % (ref 11.0–15.0)
Total Lymphocyte: 19.7 %
WBC: 7 10*3/uL (ref 3.8–10.8)

## 2020-07-28 LAB — VITAMIN D 1,25 DIHYDROXY
Vitamin D 1, 25 (OH)2 Total: 55 pg/mL (ref 18–72)
Vitamin D2 1, 25 (OH)2: <8 pg/mL
Vitamin D3 1, 25 (OH)2: 55 pg/mL

## 2020-07-28 LAB — VITAMIN B12: Vitamin B-12: 679 pg/mL (ref 200–1100)

## 2020-07-29 ENCOUNTER — Encounter: Payer: Self-pay | Admitting: Family

## 2020-07-29 DIAGNOSIS — Z87891 Personal history of nicotine dependence: Secondary | ICD-10-CM | POA: Insufficient documentation

## 2020-07-29 DIAGNOSIS — F411 Generalized anxiety disorder: Secondary | ICD-10-CM | POA: Insufficient documentation

## 2020-07-29 DIAGNOSIS — E210 Primary hyperparathyroidism: Secondary | ICD-10-CM | POA: Insufficient documentation

## 2020-07-29 DIAGNOSIS — G4733 Obstructive sleep apnea (adult) (pediatric): Secondary | ICD-10-CM | POA: Insufficient documentation

## 2020-07-29 DIAGNOSIS — G8929 Other chronic pain: Secondary | ICD-10-CM | POA: Insufficient documentation

## 2020-07-29 DIAGNOSIS — E669 Obesity, unspecified: Secondary | ICD-10-CM | POA: Insufficient documentation

## 2020-07-29 DIAGNOSIS — Z9989 Dependence on other enabling machines and devices: Secondary | ICD-10-CM | POA: Insufficient documentation

## 2020-07-29 DIAGNOSIS — F331 Major depressive disorder, recurrent, moderate: Secondary | ICD-10-CM | POA: Insufficient documentation

## 2020-07-29 DIAGNOSIS — R42 Dizziness and giddiness: Secondary | ICD-10-CM | POA: Insufficient documentation

## 2020-07-29 DIAGNOSIS — N529 Male erectile dysfunction, unspecified: Secondary | ICD-10-CM | POA: Insufficient documentation

## 2020-07-29 DIAGNOSIS — J984 Other disorders of lung: Secondary | ICD-10-CM | POA: Insufficient documentation

## 2020-08-29 ENCOUNTER — Other Ambulatory Visit: Payer: Self-pay | Admitting: Specialist

## 2020-08-29 DIAGNOSIS — G4486 Cervicogenic headache: Secondary | ICD-10-CM

## 2020-09-11 ENCOUNTER — Other Ambulatory Visit (HOSPITAL_COMMUNITY): Payer: Self-pay | Admitting: Specialist

## 2020-09-11 DIAGNOSIS — G4486 Cervicogenic headache: Secondary | ICD-10-CM

## 2020-09-19 ENCOUNTER — Other Ambulatory Visit: Payer: Self-pay

## 2020-09-19 ENCOUNTER — Ambulatory Visit (HOSPITAL_COMMUNITY)
Admission: RE | Admit: 2020-09-19 | Discharge: 2020-09-19 | Disposition: A | Discharge status: Home / Self Care | Payer: Medicare HMO | Source: Ambulatory Visit | Attending: Specialist | Admitting: Specialist

## 2020-09-19 DIAGNOSIS — G4486 Cervicogenic headache: Secondary | ICD-10-CM | POA: Diagnosis present

## 2020-09-28 ENCOUNTER — Other Ambulatory Visit: Payer: Self-pay

## 2020-09-28 MED ORDER — BUSPIRONE HCL 5 MG PO TABS: 5.0000 mg | ORAL_TABLET | Freq: Two times a day (BID) | ORAL | 1 refills | Status: DC

## 2020-09-28 MED ORDER — COLESTIPOL HCL 1 G PO TABS: 1.0000 g | ORAL_TABLET | Freq: Two times a day (BID) | ORAL | 1 refills | Status: DC

## 2020-09-28 NOTE — Addendum Note (Signed)
Addended by: Maurice Small on: 09/28/2020 10:21 AM   Modules accepted: Orders

## 2020-10-04 ENCOUNTER — Other Ambulatory Visit: Payer: Self-pay | Admitting: *Deleted

## 2020-10-04 MED ORDER — LOSARTAN POTASSIUM 25 MG PO TABS: 25.0000 mg | ORAL_TABLET | Freq: Every day | ORAL | 1 refills | Status: DC

## 2020-10-04 MED ORDER — AMLODIPINE BESYLATE 10 MG PO TABS: 10.0000 mg | ORAL_TABLET | Freq: Every day | ORAL | 1 refills | Status: DC

## 2020-10-04 MED ORDER — ATORVASTATIN CALCIUM 40 MG PO TABS: 40.0000 mg | ORAL_TABLET | Freq: Every evening | ORAL | 1 refills | Status: DC

## 2020-10-04 MED ORDER — POTASSIUM CHLORIDE CRYS ER 20 MEQ PO TBCR: 20.0000 meq | EXTENDED_RELEASE_TABLET | Freq: Every day | ORAL | 1 refills | Status: DC

## 2020-10-04 MED ORDER — OMEPRAZOLE 20 MG PO CPDR: 20.0000 mg | DELAYED_RELEASE_CAPSULE | Freq: Every day | ORAL | 1 refills | Status: DC | PRN

## 2020-10-04 MED ORDER — DUTASTERIDE 0.5 MG PO CAPS: 0.5000 mg | ORAL_CAPSULE | Freq: Every day | ORAL | 1 refills | Status: DC

## 2020-10-04 NOTE — Addendum Note (Signed)
Addended by: Nelda Severe A on: 10/04/2020 03:28 PM   Modules accepted: Orders

## 2020-10-04 NOTE — Telephone Encounter (Signed)
[facility] Requested refills.

## 2020-10-05 ENCOUNTER — Other Ambulatory Visit: Payer: Self-pay | Admitting: *Deleted

## 2020-10-05 MED ORDER — TERAZOSIN HCL 5 MG PO CAPS: 5.0000 mg | ORAL_CAPSULE | Freq: Every evening | ORAL | 1 refills | Status: DC

## 2020-10-05 NOTE — Telephone Encounter (Signed)
[facility] requested refill.  

## 2020-10-19 ENCOUNTER — Telehealth: Payer: Self-pay

## 2020-10-19 DIAGNOSIS — N401 Enlarged prostate with lower urinary tract symptoms: Secondary | ICD-10-CM

## 2020-10-19 DIAGNOSIS — R338 Other retention of urine: Secondary | ICD-10-CM

## 2020-10-19 NOTE — Telephone Encounter (Signed)
-----   Message from Caesar Bookman, NP sent at 10/19/2020  4:26 PM EDT ----- Regarding: RE: Lab Please call and ask patient what symptoms he is having so that we can add PSA.   ----- Message ----- From: Tera Partridge Sent: 10/19/2020   1:14 PM EDT To: Caesar Bookman, NP Subject: Lab                                            The attached patient called and wanted to add PSA to his labs that are coming up also.

## 2020-10-19 NOTE — Telephone Encounter (Signed)
PSA added per patient's request

## 2020-10-19 NOTE — Telephone Encounter (Signed)
Patient wife called and requested this lab. Patient wife states that patient had biopsy that messed up his PSA and wanted to make sure if everything is normal or irregular. Wife states that patient isn't having any symptoms. Patient was in the background while conversation was being held. Patient could hear conversation. Message routed back to PCP Ngetich, Dinah C, NP . Please Advise.

## 2020-10-22 NOTE — Telephone Encounter (Signed)
Noted  

## 2020-10-23 ENCOUNTER — Telehealth: Payer: Self-pay

## 2020-10-23 MED ORDER — BUSPIRONE HCL 5 MG PO TABS: 5.0000 mg | ORAL_TABLET | Freq: Two times a day (BID) | ORAL | 1 refills | Status: DC

## 2020-10-23 NOTE — Telephone Encounter (Signed)
RX sent to [facility] as indicated by patients wife, for future refills

## 2020-10-23 NOTE — Telephone Encounter (Signed)
Coy Saunas, patient's wife called back stating refill should go to [facility].   Buspar was sent to [facility] and spoke with Ciara who comfirmed medication has been picked up.

## 2020-10-23 NOTE — Telephone Encounter (Signed)
Incoming call received from [facility] checking the status of a refill request for Buspar sent last week. I informed Marisue Ivan that we recently filled Buspar for patient at a local pharmacy and I will call patient to inquire about his preference. Marisue Ivan verbalized understanding and stated she will notate patients account.    Left message on voicemail for patient to return call when available. Awaiting return call.

## 2020-10-24 ENCOUNTER — Other Ambulatory Visit: Payer: Medicare HMO

## 2020-10-24 ENCOUNTER — Other Ambulatory Visit: Payer: Self-pay

## 2020-10-24 DIAGNOSIS — R338 Other retention of urine: Secondary | ICD-10-CM

## 2020-10-24 DIAGNOSIS — N401 Enlarged prostate with lower urinary tract symptoms: Secondary | ICD-10-CM

## 2020-10-24 LAB — PSA: PSA: 16.35 ng/mL — ABNORMAL HIGH (ref <=4.00)

## 2020-10-31 ENCOUNTER — Other Ambulatory Visit: Payer: Self-pay

## 2020-10-31 ENCOUNTER — Ambulatory Visit (INDEPENDENT_AMBULATORY_CARE_PROVIDER_SITE_OTHER): Payer: Medicare HMO | Admitting: Orthopedic Surgery

## 2020-10-31 ENCOUNTER — Encounter: Payer: Self-pay | Admitting: Orthopedic Surgery

## 2020-10-31 VITALS — BP 102/70 | HR 68 | Temp 97.1°F | Ht 70.0 in | Wt 275.6 lb

## 2020-10-31 DIAGNOSIS — R338 Other retention of urine: Secondary | ICD-10-CM

## 2020-10-31 DIAGNOSIS — R269 Unspecified abnormalities of gait and mobility: Secondary | ICD-10-CM | POA: Diagnosis not present

## 2020-10-31 DIAGNOSIS — N401 Enlarged prostate with lower urinary tract symptoms: Secondary | ICD-10-CM

## 2020-10-31 DIAGNOSIS — F411 Generalized anxiety disorder: Secondary | ICD-10-CM

## 2020-10-31 DIAGNOSIS — E538 Deficiency of other specified B group vitamins: Secondary | ICD-10-CM

## 2020-10-31 DIAGNOSIS — Z9989 Dependence on other enabling machines and devices: Secondary | ICD-10-CM

## 2020-10-31 DIAGNOSIS — F331 Major depressive disorder, recurrent, moderate: Secondary | ICD-10-CM

## 2020-10-31 DIAGNOSIS — K219 Gastro-esophageal reflux disease without esophagitis: Secondary | ICD-10-CM

## 2020-10-31 DIAGNOSIS — E663 Overweight: Secondary | ICD-10-CM

## 2020-10-31 DIAGNOSIS — E559 Vitamin D deficiency, unspecified: Secondary | ICD-10-CM

## 2020-10-31 DIAGNOSIS — E785 Hyperlipidemia, unspecified: Secondary | ICD-10-CM | POA: Diagnosis not present

## 2020-10-31 DIAGNOSIS — I10 Essential (primary) hypertension: Secondary | ICD-10-CM | POA: Diagnosis not present

## 2020-10-31 DIAGNOSIS — G4733 Obstructive sleep apnea (adult) (pediatric): Secondary | ICD-10-CM

## 2020-10-31 DIAGNOSIS — Z6839 Body mass index (BMI) 39.0-39.9, adult: Secondary | ICD-10-CM

## 2020-10-31 DIAGNOSIS — B3789 Other sites of candidiasis: Secondary | ICD-10-CM

## 2020-10-31 DIAGNOSIS — R42 Dizziness and giddiness: Secondary | ICD-10-CM

## 2020-10-31 NOTE — Progress Notes (Signed)
Careteam: Patient Care Team: Ngetich, Donalee Citrin, NP as PCP - General (Family Medicine)  Seen by: Hazle Nordmann, AGNP-C  PLACE OF SERVICE:  [facility]  Advanced Directive information    No Known Allergies  Chief Complaint  Patient presents with  . Medical Management of Chronic Issues    Patient returns to the office for his 4 month follow up.      HPI: Patient is a 85 y.o. male seen today for medical management of chronic conditions.   Recent labs discussed with patient.   Continues to urinate about 3-4 times a night. Reports urinary frequency, urgency and leaking. Stops fluids prior to bedtime. Tried flomax and avodart in the past and thought they were unsuccessful. Taking terazosin daily  Followed by Dr. Shelby Dubin. PSA 16.35 04/30. He was not interested in seeing urology, but has decided to schedule follow up to discuss frequency.   Last fall one year ago. Continues to walk with rollator, starting to furniture grab. He has trouble with his strength. Tried PT in the past. Wife requesting referral. Denies dizziness. No recent falls or injuries.   Poor diet. Always hungry. Joined weigh watchers. Weight has been varying. Wife believes some of his eating is related to depression.   Just completed 4th series of shots for headaches. Today, headaches described as dull with radiation to neck. Gabapentin increased to 600 mg. Sleeping well.    Rash reported at skin fold between abdomen and periarea. They have been applying nystatin power. Denies itching.   Continues to use CPAP Qhs.       Review of Systems:  Review of Systems  Constitutional: Negative for chills, fever, malaise/fatigue and weight loss.  HENT: Positive for hearing loss. Negative for sore throat.   Eyes: Negative for blurred vision.       Glasses  Respiratory: Negative for cough, shortness of breath and wheezing.   Cardiovascular: Positive for orthopnea. Negative for chest pain and leg swelling.   Gastrointestinal: Negative for abdominal pain, constipation, heartburn, nausea and vomiting.  Genitourinary: Positive for frequency and urgency. Negative for dysuria and hematuria.  Musculoskeletal: Positive for joint pain and myalgias. Negative for falls.  Skin: Positive for rash.  Neurological: Positive for weakness and headaches. Negative for dizziness.  Psychiatric/Behavioral: Positive for depression. The patient is not nervous/anxious and does not have insomnia.     Past Medical History:  Diagnosis Date  . Anxiety   . BPH (benign prostatic hyperplasia)   . Coronary artery disease   . Depression   . Dizziness   . Dyspnea    with exertion   . Fecal incontinence   . GERD (gastroesophageal reflux disease)   . Glaucoma   . Hard of hearing   . Headache   . History of pneumonia   . Hyperlipidemia   . Hypertension   . Memory impairment   . Migraine headache   . Neck fracture (HCC)    2-3/2-21 followed by DR Sandi Carne in [place]   . Nocturia   . Osteoarthritis   . Restrictive lung disease   . Sleep apnea    wears CPAP does not know setting   . Thyroid disease    Hyperparathyroidism   . Urinary frequency   . Urinary incontinence   . Wears glasses    Past Surgical History:  Procedure Laterality Date  . CARDIAC CATHETERIZATION    . CIRCUMCISION    . COLONOSCOPY    . CORONARY ANGIOPLASTY  stents- 08/2019   . ESOPHAGOGASTRODUODENOSCOPY    . EYE SURGERY Bilateral    cataract  . HAND SURGERY Right   . HIP ARTHROPLASTY Right   . HIP ARTHROPLASTY Left 2006  . KNEE CARTILAGE SURGERY Bilateral   . LUMBAR LAMINECTOMY     L3 - L4  . RETINAL DETACHMENT SURGERY Right    x2  . REVERSE SHOULDER ARTHROPLASTY Right 05/29/2016   Procedure: REVERSE SHOULDER ARTHROPLASTY;  Surgeon: Francena HanlyKevin Supple, MD;  Location: [facility] OR;  Service: Orthopedics;  Laterality: Right;  . SHOULDER SURGERY Bilateral   . TOTAL HIP REVISION Right 04/04/2020   Procedure: Right hip acetabular liner  revision;  Surgeon: Ollen GrossAluisio, Frank, MD;  Location: [facility] ORS;  Service: Orthopedics;  Laterality: Right;  90min   Social History:   reports that he has quit smoking. He has never used smokeless tobacco. He reports current alcohol use. He reports that he does not use drugs.  History reviewed. No pertinent family history.  Medications: Patient's Medications  New Prescriptions   No medications on file  Previous Medications   ACYCLOVIR (ZOVIRAX) 800 MG TABLET    Take 800 mg by mouth daily as needed (shingles).   AMLODIPINE (NORVASC) 10 MG TABLET    Take 1 tablet (10 mg total) by mouth daily.   ASPIRIN 81 MG TABLET    Take 81 mg by mouth 2 (two) times daily.   ATORVASTATIN (LIPITOR) 40 MG TABLET    Take 1 tablet (40 mg total) by mouth every evening.   BUSPIRONE (BUSPAR) 5 MG TABLET    Take 1 tablet (5 mg total) by mouth 2 (two) times daily.   CALCIUM CARBONATE ANTACID (TUMS PO)    Take by mouth.   CHOLECALCIFEROL (VITAMIN D) 2000 UNITS CAPS    Take 2,000 Units by mouth daily.   COLESTIPOL (COLESTID) 1 G TABLET    Take 1 tablet (1 g total) by mouth 2 (two) times daily.   DUTASTERIDE (AVODART) 0.5 MG CAPSULE    Take 1 capsule (0.5 mg total) by mouth daily.   FUROSEMIDE (LASIX) 20 MG TABLET    Take 20 mg by mouth daily.    GABAPENTIN (NEURONTIN) 600 MG TABLET    Take 600 mg by mouth daily.   LATANOPROST (XALATAN) 0.005 % OPHTHALMIC SOLUTION    Place 1 drop into both eyes at bedtime.   LOSARTAN (COZAAR) 25 MG TABLET    Take 1 tablet (25 mg total) by mouth daily.   MAGNESIUM OXIDE (MAG-OX) 400 MG TABLET    Take 400 mg by mouth daily.   MOMETASONE (ASMANEX) 220 MCG/INH INHALER    Inhale 2 puffs into the lungs at bedtime.   OMEPRAZOLE (PRILOSEC) 20 MG CAPSULE    Take 1 capsule (20 mg total) by mouth daily as needed (acid reflux).   POTASSIUM CHLORIDE SA (KLOR-CON) 20 MEQ TABLET    Take 1 tablet (20 mEq total) by mouth daily.   PRASUGREL (EFFIENT) 10 MG TABS TABLET    Take 10 mg by mouth daily.    PRESCRIPTION MEDICATION    Inhale 1 puff into the lungs at bedtime. Unknown inhaler (patient states he will bring with him to his upcoming pre operative appointment)   PROBIOTIC PRODUCT (PROBIOTIC PO)    Take 1 capsule by mouth daily.   TERAZOSIN (HYTRIN) 5 MG CAPSULE    Take 1 capsule (5 mg total) by mouth every evening.   TIMOLOL (TIMOPTIC) 0.5 % OPHTHALMIC SOLUTION    Place 1 drop  into both eyes 2 (two) times daily.   VITAMIN B-12 (CYANOCOBALAMIN) 1000 MCG TABLET    Take 1,000 mcg by mouth daily.  Modified Medications   No medications on file  Discontinued Medications   ACETAMINOPHEN (TYLENOL) 500 MG TABLET    Take 500 mg by mouth every 6 (six) hours as needed.   RIZATRIPTAN (MAXALT) 10 MG TABLET    Take 10 mg by mouth as needed for migraine. May repeat in 2 hours if needed   TIZANIDINE (ZANAFLEX) 4 MG TABLET    Take 4 mg by mouth in the morning and at bedtime.   VENLAFAXINE XR (EFFEXOR-XR) 75 MG 24 HR CAPSULE    Take 225 mg by mouth daily with breakfast.    Physical Exam:  Vitals:   10/31/20 1032  BP: 102/70  Pulse: 68  Temp: (!) 97.1 F (36.2 C)  SpO2: 96%  Weight: 275 lb 9.6 oz (125 kg)  Height: 5\' 10"  (1.778 m)   Body mass index is 39.54 kg/m. Wt Readings from Last 3 Encounters:  10/31/20 275 lb 9.6 oz (125 kg)  07/24/20 275 lb (124.7 kg)  04/04/20 260 lb (117.9 kg)    Physical Exam Vitals reviewed.  Constitutional:      General: He is not in acute distress. HENT:     Head: Normocephalic.  Eyes:     General:        Right eye: No discharge.        Left eye: No discharge.  Neck:     Vascular: No carotid bruit.  Cardiovascular:     Rate and Rhythm: Normal rate and regular rhythm.     Pulses: Normal pulses.     Heart sounds: Normal heart sounds. No murmur heard.   Pulmonary:     Effort: Pulmonary effort is normal. No respiratory distress.     Breath sounds: Normal breath sounds. No wheezing.  Abdominal:     General: Bowel sounds are normal. There is no  distension.     Palpations: Abdomen is soft.     Tenderness: There is no abdominal tenderness.  Musculoskeletal:     Cervical back: Normal range of motion.     Right lower leg: No edema.     Left lower leg: No edema.  Lymphadenopathy:     Cervical: No cervical adenopathy.  Skin:    General: Skin is warm and dry.     Capillary Refill: Capillary refill takes less than 2 seconds.     Comments: Excoriated skin between abdomen and  periarea fold.   Neurological:     General: No focal deficit present.     Mental Status: He is alert and oriented to person, place, and time.     Motor: Weakness present.     Gait: Gait abnormal.     Comments: rollator  Psychiatric:        Mood and Affect: Mood normal.        Behavior: Behavior normal.     Labs reviewed: Basic Metabolic Panel: Recent Labs    03/26/20 1441 04/05/20 0311 07/24/20 0000  NA 140 140 141  K 3.7 3.1* 3.9  CL 110 109 106  CO2 23 21* 25  GLUCOSE 102* 153* 75  BUN 19 23 18   CREATININE 1.01 1.10 1.05  CALCIUM 8.8* 8.4* 9.3   Liver Function Tests: Recent Labs    03/26/20 1441 07/24/20 0000  AST 15 23  ALT 12 20  ALKPHOS 69  --   BILITOT 0.7  0.5  PROT 6.7 6.7  ALBUMIN 3.8  --    No results for input(s): LIPASE, AMYLASE in the last 8760 hours. No results for input(s): AMMONIA in the last 8760 hours. CBC: Recent Labs    03/26/20 1441 04/05/20 0549 07/24/20 0000  WBC 8.1 11.1* 7.0  NEUTROABS  --   --  4,620  HGB 13.5 14.1 15.1  HCT 41.0 43.0 44.2  MCV 93.6 92.1 90.8  PLT 135* 194 177   Lipid Panel: Recent Labs    07/24/20 0000  CHOL 158  HDL 35*  LDLCALC 89  TRIG 545*  CHOLHDL 4.5   TSH: No results for input(s): TSH in the last 8760 hours. A1C: No results found for: HGBA1C   Assessment/Plan 1. Abnormal gait - no recent falls, beginning to furniture grab - ambulating with rollator - Ambulatory referral to Physical Therapy  2. Benign prostatic hyperplasia with urinary retention - nocturia  3-4 times a night - unsuccessful trials with flomax and avodart - cont to decrease fluids 2 hours prior to bedtime - cont terazosin Qhs - recommend scheduling appointment with urology  3. Hyperlipidemia LDL goal <100 - stable with statin - lipid panel- future  4. Essential hypertension - controlled with amlodipine, losartan - cont DASH diet - cbc/diff -cmp  5. Gastroesophageal reflux disease without esophagitis - stable with prilosec - cont to avoid food triggers  6. OSA on CPAP - no excessive daytime sleepiness, some orthopnea - cont CPAP use  7. Dizziness - no recent episodes  8. Moderate episode of recurrent major depressive disorder (HCC) - admits to increased eating - may consider SSRI  In future  9. Vitamin B12 deficiency - cont cyanocobalamin 1000 mcg daily  10. Vitamin D deficiency -cont vitamin D 2000 units daily  11. Generalized anxiety disorder - no recent panic attacks - cont buspar regimen  12. BMI 39.0-39.9,adult - recently jointed weight watchers - recommend counting calories, < 2000 calories/day - recommend light exercise 150 min/week - TSH  13. Candida rash of groin - cont nystatin - recommend Interdry fabric   Total time: 38 minutes. Greater than 50% of total time spent doing patient education and coordination of care regarding weight loss, calorie counting and health maintence   Next appt:Visit date not found Vearl Allbaugh Scherry Ran  [facility] [phone]

## 2020-10-31 NOTE — Patient Instructions (Addendum)
Will check about labs  Referral PT made  Recommend urology follow up for frequency and urgency  May try melatonin 5 mg every night for sleep  Calorie Counting for Weight Loss Calories are units of energy. Your body needs a certain number of calories from food to keep going throughout the day. When you eat or drink more calories than your body needs, your body stores the extra calories mostly as fat. When you eat or drink fewer calories than your body needs, your body burns fat to get the energy it needs. Calorie counting means keeping track of how many calories you eat and drink each day. Calorie counting can be helpful if you need to lose weight. If you eat fewer calories than your body needs, you should lose weight. Ask your health care provider what a healthy weight is for you. For calorie counting to work, you will need to eat the right number of calories each day to lose a healthy amount of weight per week. A dietitian can help you figure out how many calories you need in a day and will suggest ways to reach your calorie goal.  A healthy amount of weight to lose each week is usually 1-2 lb (0.5-0.9 kg). This usually means that your daily calorie intake should be reduced by 500-750 calories.  Eating 1,200-1,500 calories a day can help most women lose weight.  Eating 1,500-1,800 calories a day can help most men lose weight. What do I need to know about calorie counting? Work with your health care provider or dietitian to determine how many calories you should get each day. To meet your daily calorie goal, you will need to:  Find out how many calories are in each food that you would like to eat. Try to do this before you eat.  Decide how much of the food you plan to eat.  Keep a food log. Do this by writing down what you ate and how many calories it had. To successfully lose weight, it is important to balance calorie counting with a healthy lifestyle that includes regular activity. Where  do I find calorie information? The number of calories in a food can be found on a Nutrition Facts label. If a food does not have a Nutrition Facts label, try to look up the calories online or ask your dietitian for help. Remember that calories are listed per serving. If you choose to have more than one serving of a food, you will have to multiply the calories per serving by the number of servings you plan to eat. For example, the label on a package of bread might say that a serving size is 1 slice and that there are 90 calories in a serving. If you eat 1 slice, you will have eaten 90 calories. If you eat 2 slices, you will have eaten 180 calories.   How do I keep a food log? After each time that you eat, record the following in your food log as soon as possible:  What you ate. Be sure to include toppings, sauces, and other extras on the food.  How much you ate. This can be measured in cups, ounces, or number of items.  How many calories were in each food and drink.  The total number of calories in the food you ate. Keep your food log near you, such as in a pocket-sized notebook or on an app or website on your mobile phone. Some programs will calculate calories for you and show  you how many calories you have left to meet your daily goal. What are some portion-control tips?  Know how many calories are in a serving. This will help you know how many servings you can have of a certain food.  Use a measuring cup to measure serving sizes. You could also try weighing out portions on a kitchen scale. With time, you will be able to estimate serving sizes for some foods.  Take time to put servings of different foods on your favorite plates or in your favorite bowls and cups so you know what a serving looks like.  Try not to eat straight from a food's packaging, such as from a bag or box. Eating straight from the package makes it hard to see how much you are eating and can lead to overeating. Put the  amount you would like to eat in a cup or on a plate to make sure you are eating the right portion.  Use smaller plates, glasses, and bowls for smaller portions and to prevent overeating.  Try not to multitask. For example, avoid watching TV or using your computer while eating. If it is time to eat, sit down at a table and enjoy your food. This will help you recognize when you are full. It will also help you be more mindful of what and how much you are eating. What are tips for following this plan? Reading food labels  Check the calorie count compared with the serving size. The serving size may be smaller than what you are used to eating.  Check the source of the calories. Try to choose foods that are high in protein, fiber, and vitamins, and low in saturated fat, trans fat, and sodium. Shopping  Read nutrition labels while you shop. This will help you make healthy decisions about which foods to buy.  Pay attention to nutrition labels for low-fat or fat-free foods. These foods sometimes have the same number of calories or more calories than the full-fat versions. They also often have added sugar, starch, or salt to make up for flavor that was removed with the fat.  Make a grocery list of lower-calorie foods and stick to it. Cooking  Try to cook your favorite foods in a healthier way. For example, try baking instead of frying.  Use low-fat dairy products. Meal planning  Use more fruits and vegetables. One-half of your plate should be fruits and vegetables.  Include lean proteins, such as chicken, [place], and fish. Lifestyle Each week, aim to do one of the following:  150 minutes of moderate exercise, such as walking.  75 minutes of vigorous exercise, such as running. General information  Know how many calories are in the foods you eat most often. This will help you calculate calorie counts faster.  Find a way of tracking calories that works for you. Get creative. Try different  apps or programs if writing down calories does not work for you. What foods should I eat?  Eat nutritious foods. It is better to have a nutritious, high-calorie food, such as an avocado, than a food with few nutrients, such as a bag of potato chips.  Use your calories on foods and drinks that will fill you up and will not leave you hungry soon after eating. ? Examples of foods that fill you up are nuts and nut butters, vegetables, lean proteins, and high-fiber foods such as whole grains. High-fiber foods are foods with more than 5 g of fiber per serving.  Pay attention  to calories in drinks. Low-calorie drinks include water and unsweetened drinks. The items listed above may not be a complete list of foods and beverages you can eat. Contact a dietitian for more information.   What foods should I limit? Limit foods or drinks that are not good sources of vitamins, minerals, or protein or that are high in unhealthy fats. These include:  Candy.  Other sweets.  Sodas, specialty coffee drinks, alcohol, and juice. The items listed above may not be a complete list of foods and beverages you should avoid. Contact a dietitian for more information. How do I count calories when eating out?  Pay attention to portions. Often, portions are much larger when eating out. Try these tips to keep portions smaller: ? Consider sharing a meal instead of getting your own. ? If you get your own meal, eat only half of it. Before you start eating, ask for a container and put half of your meal into it. ? When available, consider ordering smaller portions from the menu instead of full portions.  Pay attention to your food and drink choices. Knowing the way food is cooked and what is included with the meal can help you eat fewer calories. ? If calories are listed on the menu, choose the lower-calorie options. ? Choose dishes that include vegetables, fruits, whole grains, low-fat dairy products, and lean  proteins. ? Choose items that are boiled, broiled, grilled, or steamed. Avoid items that are buttered, battered, fried, or served with cream sauce. Items labeled as crispy are usually fried, unless stated otherwise. ? Choose water, low-fat milk, unsweetened iced tea, or other drinks without added sugar. If you want an alcoholic beverage, choose a lower-calorie option, such as a glass of wine or light beer. ? Ask for dressings, sauces, and syrups on the side. These are usually high in calories, so you should limit the amount you eat. ? If you want a salad, choose a garden salad and ask for grilled meats. Avoid extra toppings such as bacon, cheese, or fried items. Ask for the dressing on the side, or ask for olive oil and vinegar or lemon to use as dressing.  Estimate how many servings of a food you are given. Knowing serving sizes will help you be aware of how much food you are eating at restaurants. Where to find more information  Centers for Disease Control and Prevention: FootballExhibition.com.br  [place] Department of Agriculture: WrestlingReporter.dk Summary  Calorie counting means keeping track of how many calories you eat and drink each day. If you eat fewer calories than your body needs, you should lose weight.  A healthy amount of weight to lose per week is usually 1-2 lb (0.5-0.9 kg). This usually means reducing your daily calorie intake by 500-750 calories.  The number of calories in a food can be found on a Nutrition Facts label. If a food does not have a Nutrition Facts label, try to look up the calories online or ask your dietitian for help.  Use smaller plates, glasses, and bowls for smaller portions and to prevent overeating.  Use your calories on foods and drinks that will fill you up and not leave you hungry shortly after a meal. This information is not intended to replace advice given to you by your health care provider. Make sure you discuss any questions you have with your health care  provider. Document Revised: 07/28/2019 Document Reviewed: 07/28/2019 Elsevier Patient Education  2021 [employer].

## 2020-11-06 ENCOUNTER — Encounter: Payer: Self-pay | Admitting: Orthopedic Surgery

## 2020-11-21 ENCOUNTER — Ambulatory Visit: Payer: Medicare HMO | Admitting: Family

## 2020-12-10 ENCOUNTER — Other Ambulatory Visit: Payer: Self-pay

## 2020-12-10 ENCOUNTER — Other Ambulatory Visit: Payer: Medicare HMO

## 2020-12-10 DIAGNOSIS — I10 Essential (primary) hypertension: Secondary | ICD-10-CM

## 2020-12-10 DIAGNOSIS — E663 Overweight: Secondary | ICD-10-CM

## 2020-12-10 DIAGNOSIS — E785 Hyperlipidemia, unspecified: Secondary | ICD-10-CM

## 2020-12-10 LAB — CBC WITH DIFFERENTIAL/PLATELET
Absolute Monocytes: 519 cells/uL (ref 200–950)
Basophils Absolute: 51 cells/uL (ref 0–200)
Basophils Relative: 0.9 %
Eosinophils Absolute: 251 cells/uL (ref 15–500)
Eosinophils Relative: 4.4 %
HCT: 40.8 % (ref 38.5–50.0)
Hemoglobin: 13.5 g/dL (ref 13.2–17.1)
Lymphs Abs: 986 cells/uL (ref 850–3900)
MCH: 30.3 pg (ref 27.0–33.0)
MCHC: 33.1 g/dL (ref 32.0–36.0)
MCV: 91.5 fL (ref 80.0–100.0)
MPV: 11.2 fL (ref 7.5–12.5)
Monocytes Relative: 9.1 %
Neutro Abs: 3893 cells/uL (ref 1500–7800)
Neutrophils Relative %: 68.3 %
Platelets: 142 10*3/uL (ref 140–400)
RBC: 4.46 10*6/uL (ref 4.20–5.80)
RDW: 12.5 % (ref 11.0–15.0)
Total Lymphocyte: 17.3 %
WBC: 5.7 10*3/uL (ref 3.8–10.8)

## 2020-12-10 LAB — COMPREHENSIVE METABOLIC PANEL
AG Ratio: 1.4 (calc) (ref 1.0–2.5)
ALT: 15 U/L (ref 9–46)
AST: 17 U/L (ref 10–35)
Albumin: 3.6 g/dL (ref 3.6–5.1)
Alkaline phosphatase (APISO): 61 U/L (ref 35–144)
BUN/Creatinine Ratio: 17 (calc) (ref 6–22)
BUN: 21 mg/dL (ref 7–25)
CO2: 24 mmol/L (ref 20–32)
Calcium: 9.2 mg/dL (ref 8.6–10.3)
Chloride: 110 mmol/L (ref 98–110)
Creat: 1.27 mg/dL — ABNORMAL HIGH (ref 0.70–1.11)
Globulin: 2.6 g/dL (calc) (ref 1.9–3.7)
Glucose, Bld: 98 mg/dL (ref 65–99)
Potassium: 4.2 mmol/L (ref 3.5–5.3)
Sodium: 143 mmol/L (ref 135–146)
Total Bilirubin: 0.6 mg/dL (ref 0.2–1.2)
Total Protein: 6.2 g/dL (ref 6.1–8.1)

## 2020-12-10 LAB — LIPID PANEL
Cholesterol: 132 mg/dL (ref <200)
HDL: 31 mg/dL — ABNORMAL LOW (ref >=40)
LDL Cholesterol (Calc): 75 mg/dL (calc)
Non-HDL Cholesterol (Calc): 101 mg/dL (calc) (ref <130)
Total CHOL/HDL Ratio: 4.3 (calc) (ref <5.0)
Triglycerides: 161 mg/dL — ABNORMAL HIGH (ref <150)

## 2020-12-10 LAB — TSH: TSH: 2.25 mIU/L (ref 0.40–4.50)

## 2020-12-13 ENCOUNTER — Ambulatory Visit (INDEPENDENT_AMBULATORY_CARE_PROVIDER_SITE_OTHER): Payer: Medicare HMO | Admitting: Family

## 2020-12-13 ENCOUNTER — Other Ambulatory Visit: Payer: Self-pay

## 2020-12-13 ENCOUNTER — Ambulatory Visit: Payer: Medicare HMO | Admitting: Family

## 2020-12-13 ENCOUNTER — Encounter: Payer: Self-pay | Admitting: Family

## 2020-12-13 VITALS — BP 120/80 | HR 68 | Temp 97.7°F | Resp 18 | Ht 70.0 in | Wt 206.0 lb

## 2020-12-13 DIAGNOSIS — N401 Enlarged prostate with lower urinary tract symptoms: Secondary | ICD-10-CM

## 2020-12-13 DIAGNOSIS — R42 Dizziness and giddiness: Secondary | ICD-10-CM

## 2020-12-13 DIAGNOSIS — F411 Generalized anxiety disorder: Secondary | ICD-10-CM | POA: Diagnosis not present

## 2020-12-13 DIAGNOSIS — R338 Other retention of urine: Secondary | ICD-10-CM

## 2020-12-13 DIAGNOSIS — I10 Essential (primary) hypertension: Secondary | ICD-10-CM | POA: Diagnosis not present

## 2020-12-13 DIAGNOSIS — R269 Unspecified abnormalities of gait and mobility: Secondary | ICD-10-CM

## 2020-12-13 DIAGNOSIS — E785 Hyperlipidemia, unspecified: Secondary | ICD-10-CM | POA: Diagnosis not present

## 2020-12-13 NOTE — Progress Notes (Signed)
Provider: Richarda Blade FNP-C   Tobyn Osgood, Donalee Citrin, NP  Patient Care Team: Karell Tukes, Donalee Citrin, NP as PCP - General (Family Medicine)  Extended Emergency Contact Information Primary Emergency Contact: Hollyfield,Rosemarie Address: [address]          [place], [place] [address] D[place] of [place] Home Phone: [phone] Mobile Phone: [phone] Relation: Spouse Secondary Emergency Contact: Carlos Levering Address: [address]          [place], [place] [address] Darden Amber of [place] Home Phone: [phone] Mobile Phone: [phone] Relation: Daughter  Code Status:  Full Code  Goals of care: Advanced Directive information Advanced Directives 12/13/2020  Does Patient Have a Medical Advance Directive? Yes  Type of Estate agent of [place];Living will  Does patient want to make changes to medical advance directive? No - Patient declined  Copy of Healthcare Power of Attorney in Chart? Yes - validated most recent copy scanned in chart (See row information)     Chief Complaint  Patient presents with   Medical Management of Chronic Issues    1 month follow up.   Labs    Discuss labs.    HPI:  Pt is a 85 y.o. male seen today for one month follow up to discuss lab results He is here with wife who provides additional HPI information due to patient's [facility] Lab results reviewed and discussed during visit.Cholesterol 132,TRG 161 LDL 75 has improved compared to previous level.wife has been air frying food instead of deep fried. Exercise is limited due to unsteady gait,dizziness and chronic pain on neck and lower back. He follows up with specialist for pain management due to be seen 12/25/2020 states gabapentin not effective for neck and headache.wife states will see what specialist plans next for pain management.  Continues physical Therapy twice per week.No recent fall episode.   Past Medical History:  Diagnosis Date   Anxiety    BPH (benign  prostatic hyperplasia)    Coronary artery disease    Depression    Dizziness    Dyspnea    with exertion    Fecal incontinence    GERD (gastroesophageal reflux disease)    Glaucoma    Hard of hearing    Headache    History of pneumonia    Hyperlipidemia    Hypertension    Memory impairment    Migraine headache    Neck fracture (HCC)    2-3/2-21 followed by DR Sandi Carne in [place]    Nocturia    Osteoarthritis    Restrictive lung disease    Sleep apnea    wears CPAP does not know setting    Thyroid disease    Hyperparathyroidism    Urinary frequency    Urinary incontinence    Wears glasses    Past Surgical History:  Procedure Laterality Date   CARDIAC CATHETERIZATION     CIRCUMCISION     COLONOSCOPY     CORONARY ANGIOPLASTY     stents- 08/2019    ESOPHAGOGASTRODUODENOSCOPY     EYE SURGERY Bilateral    cataract   HAND SURGERY Right    HIP ARTHROPLASTY Right    HIP ARTHROPLASTY Left 2006   KNEE CARTILAGE SURGERY Bilateral    LUMBAR LAMINECTOMY     L3 - L4   RETINAL DETACHMENT SURGERY Right    x2   REVERSE SHOULDER ARTHROPLASTY Right 05/29/2016   Procedure: REVERSE SHOULDER ARTHROPLASTY;  Surgeon: Francena Hanly, MD;  Location: [facility] OR;  Service: Orthopedics;  Laterality: Right;   SHOULDER SURGERY Bilateral    TOTAL HIP REVISION Right 04/04/2020   Procedure: Right hip acetabular liner revision;  Surgeon: [name], MD;  Location: [facility] ORS;  Service: Orthopedics;  Laterality: Right;     Allergies  Allergen Reactions   Losartan Other (See Comments)    Allergies as of 12/13/2020       Reactions   Losartan Other (See Comments)        Medication List        Accurate as of December 13, 2020  2:55 PM. If you have any questions, ask your nurse or doctor.          acyclovir 800 MG tablet Commonly known as: ZOVIRAX Take 800 mg by mouth daily as needed (shingles).   amLODipine 10 MG tablet Commonly known as: NORVASC Take 1 tablet (10 mg total) by  mouth daily.   aspirin 81 MG tablet Take 81 mg by mouth 2 (two) times daily.   atorvastatin 40 MG tablet Commonly known as: LIPITOR Take 1 tablet (40 mg total) by mouth every evening.   busPIRone 5 MG tablet Commonly known as: BUSPAR Take 1 tablet (5 mg total) by mouth 2 (two) times daily.   colestipol 1 g tablet Commonly known as: COLESTID Take 1 tablet (1 g total) by mouth 2 (two) times daily.   furosemide 20 MG tablet Commonly known as: LASIX Take 20 mg by mouth daily.   gabapentin 600 MG tablet Commonly known as: NEURONTIN Take 600 mg by mouth daily.   latanoprost 0.005 % ophthalmic solution Commonly known as: XALATAN Place 1 drop into both eyes at bedtime.   losartan 25 MG tablet Commonly known as: COZAAR Take 1 tablet (25 mg total) by mouth daily.   magnesium oxide 400 MG tablet Commonly known as: MAG-OX Take 400 mg by mouth daily.   mometasone 220 MCG/INH inhaler Commonly known as: ASMANEX Inhale 2 puffs into the lungs at bedtime.   omeprazole 20 MG capsule Commonly known as: PRILOSEC Take 1 capsule (20 mg total) by mouth daily as needed (acid reflux).   potassium chloride SA 20 MEQ tablet Commonly known as: KLOR-CON Take 1 tablet (20 mEq total) by mouth daily.   prasugrel 10 MG Tabs tablet Commonly known as: EFFIENT Take 10 mg by mouth daily.   PRESCRIPTION MEDICATION Inhale 1 puff into the lungs at bedtime. Unknown inhaler (patient states he will bring with him to his upcoming pre operative appointment)   PROBIOTIC PO Take 1 capsule by mouth daily.   terazosin 5 MG capsule Commonly known as: HYTRIN Take 1 capsule (5 mg total) by mouth every evening.   timolol 0.5 % ophthalmic solution Commonly known as: TIMOPTIC Place 1 drop into both eyes 2 (two) times daily.   TUMS PO Take by mouth.   vitamin B-12 1000 MCG tablet Commonly known as: CYANOCOBALAMIN Take 1,000 mcg by mouth daily.   Vitamin D 50 MCG (2000 UT) Caps Take 2,000 Units by  mouth daily.        Review of Systems  Constitutional:  Negative for appetite change, chills, fatigue, fever and unexpected weight change.  HENT:  Negative for congestion, dental problem, ear discharge, ear pain, facial swelling, hearing loss, nosebleeds, postnasal drip, rhinorrhea, sinus pressure, sinus pain, sneezing, sore throat, tinnitus and trouble swallowing.   Eyes:  Positive for visual disturbance. Negative for pain, discharge, redness and itching.       Has Glaucoma follows up with Dr.Joseph Carlynn Purl  Respiratory:  Negative for cough, chest tightness, shortness of breath and wheezing.   Cardiovascular:  Negative for chest pain, palpitations and leg swelling.  Gastrointestinal:  Negative for abdominal distention, abdominal pain, blood in stool, constipation, diarrhea, nausea and vomiting.  Endocrine: Negative for cold intolerance, heat intolerance, polydipsia, polyphagia and polyuria.  Genitourinary:  Positive for frequency. Negative for difficulty urinating, dysuria, flank pain and urgency.       Hx of BPH follows up with Urologist Dr.Hall   Musculoskeletal:  Positive for arthralgias and gait problem. Negative for back pain, joint swelling, myalgias and neck stiffness.       Hx of chronic neck pain from previous injury at work in 1994   Skin:  Negative for color change, pallor, rash and wound.  Neurological:  Negative for syncope, speech difficulty, weakness and numbness.       Chronic headaches and dizziness   Hematological:  Does not bruise/bleed easily.  Psychiatric/Behavioral:  Negative for agitation, behavioral problems, confusion, hallucinations, self-injury, sleep disturbance and suicidal ideas. The patient is nervous/anxious.    Immunization History  Administered Date(s) Administered   Influenza Split 04/30/2002, 04/12/2003, 04/05/2004, 04/30/2005, 04/21/2006, 05/03/2007, 03/30/2008, 03/30/2009, 03/27/2010, 05/07/2011, 03/30/2012, 02/28/2013, 05/31/2014, 04/13/2016,  05/13/2016, 04/24/2017, 04/19/2018   Influenza, High Dose Seasonal PF 03/05/2015, 03/05/2015, 05/13/2016, 04/24/2017, 04/27/2018, 03/23/2019   Influenza, Seasonal, Injecte, Preservative Fre 04/19/2018   Influenza-Unspecified 04/30/2002, 04/12/2003, 04/05/2004, 04/30/2005, 04/21/2006, 05/03/2007, 03/30/2008, 03/30/2009, 03/27/2010, 05/07/2011, 03/30/2012, 02/28/2013, 05/31/2014, 04/13/2016, 04/19/2018, 03/31/2019, 04/19/2020   Moderna Sars-Covid-2 Vaccination 07/13/2019, 08/10/2019, 04/27/2020   Pneumococcal Conjugate-13 04/12/2003, 10/18/2010, 01/22/2012, 12/22/2013   Pneumococcal Polysaccharide-23 11/22/2004   Pneumococcal-Unspecified 04/12/2003, 01/22/2012   Td 02/28/2004   Tdap 02/28/2004, 08/31/2014, 09/08/2016, 06/21/2020   Zoster Recombinat (Shingrix) 12/27/2018, 05/11/2019   Zoster, Live 07/30/2011   Pertinent  Health Maintenance Due  Topic Date Due   INFLUENZA VACCINE  01/28/2021   PNA vac Low Risk Adult  Completed   Fall Risk  12/13/2020 07/24/2020  Falls in the past year? 0 0  Number falls in past yr: 0 0  Injury with Fall? 0 0   Functional Status Survey:    Vitals:   12/13/20 1357  BP: 120/80  Pulse: 68  Resp: 18  Temp: 97.7 F (36.5 C)  SpO2: 94%  Weight: 206 lb (93.4 kg)  Height: 5\' 10"  (1.778 m)   Body mass index is 29.56 kg/m. Physical Exam  Labs reviewed: Recent Labs    04/05/20 0311 07/24/20 0000 12/10/20 1113  NA 140 141 143  K 3.1* 3.9 4.2  CL 109 106 110  CO2 21* 25 24  GLUCOSE 153* 75 98  BUN 23 18 21   CREATININE 1.10 1.05 1.27*  CALCIUM 8.4* 9.3 9.2   Recent Labs    03/26/20 1441 07/24/20 0000 12/10/20 1113  AST 15 23 17   ALT 12 20 15   ALKPHOS 69  --   --   BILITOT 0.7 0.5 0.6  PROT 6.7 6.7 6.2  ALBUMIN 3.8  --   --    Recent Labs    04/05/20 0549 07/24/20 0000 12/10/20 1113  WBC 11.1* 7.0 5.7  NEUTROABS  --  4,620 3,893  HGB 14.1 15.1 13.5  HCT 43.0 44.2 40.8  MCV 92.1 90.8 91.5  PLT 194 177 142   Lab Results   Component Value Date   TSH 2.25 12/10/2020   No results found for: HGBA1C Lab Results  Component Value Date   CHOL 132 12/10/2020   HDL 31 (L) 12/10/2020  LDLCALC 75 12/10/2020   TRIG 161 (H) 12/10/2020   CHOLHDL 4.3 12/10/2020    Significant Diagnostic Results in last 30 days:  No results found.  Assessment/Plan 1. Essential hypertension B/p well controlled. Continue on amlodipine,losartan and furosemide  2. Hyperlipidemia LDL goal <100 LDL 75 TRG high 161  Continue on ASA and Statin  - continue with dietary modification - continue on Lipitor   3. Generalized anxiety disorder Stable.  Continue on Buspirone   4. Dizziness Chronic VSS. Continue with Physical Therapy  - continue to follow up with Neuro   5. Benign prostatic hyperplasia with urinary retention Continue on terazosin   6. Abnormal gait Continue with fall and safety precaution. - continue with Physical Therapy   Family/ staff Communication: Reviewed plan of care with patient and wife verbalized understanding.  Labs/tests ordered: None   Next Appointment : Has appointment 02/21/2021   Caesar Bookmaninah C Tameria Patti, NP

## 2020-12-13 NOTE — Patient Instructions (Signed)
https://www.nhlbi.nih.gov/files/docs/public/heart/dash_brief.pdf">  DASH Eating Plan DASH stands for Dietary Approaches to Stop Hypertension. The DASH eating plan is a healthy eating plan that has been shown to: Reduce high blood pressure (hypertension). Reduce your risk for type 2 diabetes, heart disease, and stroke. Help with weight loss. What are tips for following this plan? Reading food labels Check food labels for the amount of salt (sodium) per serving. Choose foods with less than 5 percent of the Daily Value of sodium. Generally, foods with less than 300 milligrams (mg) of sodium per serving fit into this eating plan. To find whole grains, look for the word "whole" as the first word in the ingredient list. Shopping Buy products labeled as "low-sodium" or "no salt added." Buy fresh foods. Avoid canned foods and pre-made or frozen meals. Cooking Avoid adding salt when cooking. Use salt-free seasonings or herbs instead of table salt or sea salt. Check with your health care provider or pharmacist before using salt substitutes. Do not fry foods. Cook foods using healthy methods such as baking, boiling, grilling, roasting, and broiling instead. Cook with heart-healthy oils, such as olive, canola, avocado, soybean, or sunflower oil. Meal planning  Eat a balanced diet that includes: 4 or more servings of fruits and 4 or more servings of vegetables each day. Try to fill one-half of your plate with fruits and vegetables. 6-8 servings of whole grains each day. Less than 6 oz (170 g) of lean meat, poultry, or fish each day. A 3-oz (85-g) serving of meat is about the same size as a deck of cards. One egg equals 1 oz (28 g). 2-3 servings of low-fat dairy each day. One serving is 1 cup (237 mL). 1 serving of nuts, seeds, or beans 5 times each week. 2-3 servings of heart-healthy fats. Healthy fats called omega-3 fatty acids are found in foods such as walnuts, flaxseeds, fortified milks, and eggs.  These fats are also found in cold-water fish, such as sardines, salmon, and mackerel. Limit how much you eat of: Canned or prepackaged foods. Food that is high in trans fat, such as some fried foods. Food that is high in saturated fat, such as fatty meat. Desserts and other sweets, sugary drinks, and other foods with added sugar. Full-fat dairy products. Do not salt foods before eating. Do not eat more than 4 egg yolks a week. Try to eat at least 2 vegetarian meals a week. Eat more home-cooked food and less restaurant, buffet, and fast food.  Lifestyle When eating at a restaurant, ask that your food be prepared with less salt or no salt, if possible. If you drink alcohol: Limit how much you use to: 0-1 drink a day for women who are not pregnant. 0-2 drinks a day for men. Be aware of how much alcohol is in your drink. In the [place] one drink equals one 12 oz bottle of beer (355 mL), one 5 oz glass of wine (148 mL), or one 1 oz glass of hard liquor (44 mL). General information Avoid eating more than 2,300 mg of salt a day. If you have hypertension, you may need to reduce your sodium intake to 1,500 mg a day. Work with your health care provider to maintain a healthy body weight or to lose weight. Ask what an ideal weight is for you. Get at least 30 minutes of exercise that causes your heart to beat faster (aerobic exercise) most days of the week. Activities may include walking, swimming, or biking. Work with your health care provider   or dietitian to adjust your eating plan to your individual calorie needs. What foods should I eat? Fruits All fresh, dried, or frozen fruit. Canned fruit in natural juice (without addedsugar). Vegetables Fresh or frozen vegetables (raw, steamed, roasted, or grilled). Low-sodium or reduced-sodium tomato and vegetable juice. Low-sodium or reduced-sodium tomatosauce and tomato paste. Low-sodium or reduced-sodium canned vegetables. Grains Whole-grain or  whole-wheat bread. Whole-grain or whole-wheat pasta. Brown rice. Oatmeal. Quinoa. Bulgur. Whole-grain and low-sodium cereals. Pita bread.Low-fat, low-sodium crackers. Whole-wheat flour tortillas. Meats and other proteins Skinless chicken or turkey. Ground chicken or turkey. Pork with fat trimmed off. Fish and seafood. Egg whites. Dried beans, peas, or lentils. Unsalted nuts, nut butters, and seeds. Unsalted canned beans. Lean cuts of beef with fat trimmed off. Low-sodium, lean precooked or cured meat, such as sausages or meatloaves. Dairy Low-fat (1%) or fat-free (skim) milk. Reduced-fat, low-fat, or fat-free cheeses. Nonfat, low-sodium ricotta or cottage cheese. Low-fat or nonfatyogurt. Low-fat, low-sodium cheese. Fats and oils Soft margarine without trans fats. Vegetable oil. Reduced-fat, low-fat, or light mayonnaise and salad dressings (reduced-sodium). Canola, safflower, olive, avocado, soybean, andsunflower oils. Avocado. Seasonings and condiments Herbs. Spices. Seasoning mixes without salt. Other foods Unsalted popcorn and pretzels. Fat-free sweets. The items listed above may not be a complete list of foods and beverages you can eat. Contact a dietitian for more information. What foods should I avoid? Fruits Canned fruit in a light or heavy syrup. Fried fruit. Fruit in cream or buttersauce. Vegetables Creamed or fried vegetables. Vegetables in a cheese sauce. Regular canned vegetables (not low-sodium or reduced-sodium). Regular canned tomato sauce and paste (not low-sodium or reduced-sodium). Regular tomato and vegetable juice(not low-sodium or reduced-sodium). Pickles. Olives. Grains Baked goods made with fat, such as croissants, muffins, or some breads. Drypasta or rice meal packs. Meats and other proteins Fatty cuts of meat. Ribs. Fried meat. Bacon. Bologna, salami, and other precooked or cured meats, such as sausages or meat loaves. Fat from the back of a pig (fatback). Bratwurst.  Salted nuts and seeds. Canned beans with added salt. Canned orsmoked fish. Whole eggs or egg yolks. Chicken or turkey with skin. Dairy Whole or 2% milk, cream, and half-and-half. Whole or full-fat cream cheese. Whole-fat or sweetened yogurt. Full-fat cheese. Nondairy creamers. Whippedtoppings. Processed cheese and cheese spreads. Fats and oils Butter. Stick margarine. Lard. Shortening. Ghee. Bacon fat. Tropical oils, suchas coconut, palm kernel, or palm oil. Seasonings and condiments Onion salt, garlic salt, seasoned salt, table salt, and sea salt. Worcestershire sauce. Tartar sauce. Barbecue sauce. Teriyaki sauce. Soy sauce, including reduced-sodium. Steak sauce. Canned and packaged gravies. Fish sauce. Oyster sauce. Cocktail sauce. Store-bought horseradish. Ketchup. Mustard. Meat flavorings and tenderizers. Bouillon cubes. Hot sauces. Pre-made or packaged marinades. Pre-made or packaged taco seasonings. Relishes. Regular saladdressings. Other foods Salted popcorn and pretzels. The items listed above may not be a complete list of foods and beverages you should avoid. Contact a dietitian for more information. Where to find more information National Heart, Lung, and Blood Institute: www.nhlbi.nih.gov American Heart Association: www.heart.org Academy of Nutrition and Dietetics: www.eatright.org National Kidney Foundation: www.kidney.org Summary The DASH eating plan is a healthy eating plan that has been shown to reduce high blood pressure (hypertension). It may also reduce your risk for type 2 diabetes, heart disease, and stroke. When on the DASH eating plan, aim to eat more fresh fruits and vegetables, whole grains, lean proteins, low-fat dairy, and heart-healthy fats. With the DASH eating plan, you should limit salt (sodium) intake to 2,300   mg a day. If you have hypertension, you may need to reduce your sodium intake to 1,500 mg a day. Work with your health care provider or dietitian to adjust  your eating plan to your individual calorie needs. This information is not intended to replace advice given to you by your health care provider. Make sure you discuss any questions you have with your healthcare provider. Document Revised: 05/20/2019 Document Reviewed: 05/20/2019 Elsevier Patient Education  2022 Elsevier Inc.  

## 2020-12-14 ENCOUNTER — Ambulatory Visit: Payer: Medicare HMO | Admitting: Family

## 2020-12-28 ENCOUNTER — Telehealth: Payer: Self-pay

## 2020-12-28 NOTE — Telephone Encounter (Signed)
Patient's wife called wanting to let us know that patient's medications should go to [facility]. All except for Colestipol which he gets fro the [place] pharmacy.

## 2021-01-16 ENCOUNTER — Telehealth: Payer: Self-pay

## 2021-01-16 NOTE — Telephone Encounter (Signed)
-   schedule appointment with Dr.Miller to evaluate Dizziness  - May use over the counter Biotin for dry mouth as needed

## 2021-01-16 NOTE — Telephone Encounter (Signed)
Message left on clinical intake voicemail by patients spouse:   Patient is so dizzy and lethargic, patient has seen so many doctors and had so many test, wife is at her wits end (appeared to start crying).   Called returned: Spoke with patients wife, patient has come to terms that there is no cure yet is looking for relief from his symptoms. Patient feels like he is going to fall over from the dizziness and lethargic feeling when he gets up to urinate. Patient needs something to provide moisture for his mouth. Patient is on flonase for a few days and drinking orange juice to soothe his mouth. Wife states he needs something other than drugging him.   Please advise

## 2021-01-17 NOTE — Telephone Encounter (Signed)
Left detailed message on voicemail with Dinah's reply. Mrs.Labarbera was instructed to call the office to schedule an appointment with Dr.Miller.

## 2021-01-22 ENCOUNTER — Other Ambulatory Visit: Payer: Self-pay

## 2021-01-22 ENCOUNTER — Encounter: Payer: Self-pay | Admitting: Family Medicine

## 2021-01-22 ENCOUNTER — Ambulatory Visit (INDEPENDENT_AMBULATORY_CARE_PROVIDER_SITE_OTHER): Payer: Medicare HMO | Admitting: Family Medicine

## 2021-01-22 VITALS — BP 150/90 | HR 65 | Temp 97.7°F | Ht 70.0 in | Wt 284.0 lb

## 2021-01-22 DIAGNOSIS — I10 Essential (primary) hypertension: Secondary | ICD-10-CM

## 2021-01-22 DIAGNOSIS — F411 Generalized anxiety disorder: Secondary | ICD-10-CM

## 2021-01-22 DIAGNOSIS — R42 Dizziness and giddiness: Secondary | ICD-10-CM | POA: Diagnosis not present

## 2021-01-22 NOTE — Patient Instructions (Signed)
Increase BuSpar 10 mg in the a.m. and 10 mg in p.m. Regarding your blood pressure medicines, please take amlodipine in the morning and losartan at bedtime Try to do the exercises we discussed (you already know these) regularly

## 2021-01-22 NOTE — Progress Notes (Signed)
Provider:  Jacalyn Lefevre, MD  Careteam: Patient Care Team: Ngetich, Donalee Citrin, NP as PCP - General (Family Medicine)  PLACE OF SERVICE:  [facility]  Advanced Directive information    Allergies  Allergen Reactions   Losartan Other (See Comments)    Chief Complaint  Patient presents with   Acute Visit    Patient presents today for dizziness for 2 years now, but this past year has became worse.     HPI: Patient is a 85 y.o. male  .  Patient has been evaluated by vestibular specialist.  Has also tried physical therapy for dizziness.  He describes it not as a vertigo but feeling like he is about to fall and in fact has had some falls.  He uses a walker for ambulation.  We reviewed his medicines.  Gabapentin might be contributing to his dizziness but he seems unwilling to hold that to see if it might help. Also complains of headache that seems to start in his neck suggesting possible muscle contraction or tension headache.  There is a history of several neck injuries in the past.  Review of Systems:  Review of Systems  Neurological:  Positive for dizziness and headaches.  All other systems reviewed and are negative.  Past Medical History:  Diagnosis Date   Anxiety    BPH (benign prostatic hyperplasia)    Coronary artery disease    Depression    Dizziness    Dyspnea    with exertion    Fecal incontinence    GERD (gastroesophageal reflux disease)    Glaucoma    Hard of hearing    Headache    History of pneumonia    Hyperlipidemia    Hypertension    Memory impairment    Migraine headache    Neck fracture (HCC)    2-3/2-21 followed by DR Sandi Carne in [place]    Nocturia    Osteoarthritis    Restrictive lung disease    Sleep apnea    wears CPAP does not know setting    Thyroid disease    Hyperparathyroidism    Urinary frequency    Urinary incontinence    Wears glasses    Past Surgical History:  Procedure Laterality Date   CARDIAC CATHETERIZATION      CIRCUMCISION     COLONOSCOPY     CORONARY ANGIOPLASTY     stents- 08/2019    ESOPHAGOGASTRODUODENOSCOPY     EYE SURGERY Bilateral    cataract   HAND SURGERY Right    HIP ARTHROPLASTY Right    HIP ARTHROPLASTY Left 2006   KNEE CARTILAGE SURGERY Bilateral    LUMBAR LAMINECTOMY     L3 - L4   RETINAL DETACHMENT SURGERY Right    x2   REVERSE SHOULDER ARTHROPLASTY Right 05/29/2016   Procedure: REVERSE SHOULDER ARTHROPLASTY;  Surgeon: Francena Hanly, MD;  Location: [facility] OR;  Service: Orthopedics;  Laterality: Right;   SHOULDER SURGERY Bilateral    TOTAL HIP REVISION Right 04/04/2020   Procedure: Right hip acetabular liner revision;  Surgeon: [name], MD;  Location: [facility] ORS;  Service: Orthopedics;  Laterality: Right;    Social History:   reports that he has quit smoking. He has never used smokeless tobacco. He reports current alcohol use. He reports that he does not use drugs.  History reviewed. No pertinent family history.  Medications: Patient's Medications  New Prescriptions   No medications on file  Previous Medications   ACYCLOVIR (ZOVIRAX) 800 MG  TABLET    Take 800 mg by mouth daily as needed (shingles).   AMLODIPINE (NORVASC) 10 MG TABLET    Take 1 tablet (10 mg total) by mouth daily.   ASPIRIN 81 MG TABLET    Take 81 mg by mouth 2 (two) times daily.   ATORVASTATIN (LIPITOR) 40 MG TABLET    Take 1 tablet (40 mg total) by mouth every evening.   BUSPIRONE (BUSPAR) 5 MG TABLET    Take 1 tablet (5 mg total) by mouth 2 (two) times daily.   CALCIUM CARBONATE ANTACID (TUMS PO)    Take by mouth.   CHOLECALCIFEROL (VITAMIN D) 2000 UNITS CAPS    Take 2,000 Units by mouth daily.   COLESTIPOL (COLESTID) 1 G TABLET    Take 1 tablet (1 g total) by mouth 2 (two) times daily.   FUROSEMIDE (LASIX) 20 MG TABLET    Take 20 mg by mouth daily.    GABAPENTIN (NEURONTIN) 600 MG TABLET    Take 600 mg by mouth 3 (three) times daily.   LATANOPROST (XALATAN) 0.005 % OPHTHALMIC SOLUTION    Place 1  drop into both eyes at bedtime.   LOSARTAN (COZAAR) 25 MG TABLET    Take 1 tablet (25 mg total) by mouth daily.   MAGNESIUM OXIDE (MAG-OX) 400 MG TABLET    Take 400 mg by mouth daily.   MOMETASONE (ASMANEX) 220 MCG/INH INHALER    Inhale 2 puffs into the lungs at bedtime.   OMEPRAZOLE (PRILOSEC) 20 MG CAPSULE    Take 1 capsule (20 mg total) by mouth daily as needed (acid reflux).   POTASSIUM CHLORIDE SA (KLOR-CON) 20 MEQ TABLET    Take 1 tablet (20 mEq total) by mouth daily.   PRASUGREL (EFFIENT) 10 MG TABS TABLET    Take 10 mg by mouth daily.   PRESCRIPTION MEDICATION    Inhale 1 puff into the lungs at bedtime. Unknown inhaler (patient states he will bring with him to his upcoming pre operative appointment)   PROBIOTIC PRODUCT (PROBIOTIC PO)    Take 1 capsule by mouth daily.   TERAZOSIN (HYTRIN) 5 MG CAPSULE    Take 1 capsule (5 mg total) by mouth every evening.   TIMOLOL (TIMOPTIC) 0.5 % OPHTHALMIC SOLUTION    Place 1 drop into both eyes 2 (two) times daily.   VITAMIN B-12 (CYANOCOBALAMIN) 1000 MCG TABLET    Take 1,000 mcg by mouth daily.  Modified Medications   No medications on file  Discontinued Medications   No medications on file    Physical Exam:  Vitals:   01/22/21 0902  BP: (!) 150/90  Pulse: 65  Temp: 97.7 F (36.5 C)  TempSrc: Temporal  SpO2: 97%  Weight: 284 lb (128.8 kg)  Height: 5\' 10"  (1.778 m)   Body mass index is 40.75 kg/m. Wt Readings from Last 3 Encounters:  01/22/21 284 lb (128.8 kg)  12/13/20 206 lb (93.4 kg)  10/31/20 275 lb 9.6 oz (125 kg)    Physical Exam Vitals and nursing note reviewed.  Constitutional:      Appearance: He is obese.  HENT:     Ears:     Comments: Both TMs are dull without normal light reflex Cardiovascular:     Rate and Rhythm: Normal rate and regular rhythm.     Heart sounds: No murmur heard. Pulmonary:     Effort: Pulmonary effort is normal.     Breath sounds: Normal breath sounds.  Musculoskeletal:  General:  Normal range of motion.  Neurological:     General: No focal deficit present.     Mental Status: He is alert and oriented to person, place, and time.  Psychiatric:        Mood and Affect: Mood normal.        Behavior: Behavior normal.    Labs reviewed: Basic Metabolic Panel: Recent Labs    04/05/20 0311 07/24/20 0000 12/10/20 1113  NA 140 141 143  K 3.1* 3.9 4.2  CL 109 106 110  CO2 21* 25 24  GLUCOSE 153* 75 98  BUN 23 18 21   CREATININE 1.10 1.05 1.27*  CALCIUM 8.4* 9.3 9.2  TSH  --   --  2.25   Liver Function Tests: Recent Labs    03/26/20 1441 07/24/20 0000 12/10/20 1113  AST 15 23 17   ALT 12 20 15   ALKPHOS 69  --   --   BILITOT 0.7 0.5 0.6  PROT 6.7 6.7 6.2  ALBUMIN 3.8  --   --    No results for input(s): LIPASE, AMYLASE in the last 8760 hours. No results for input(s): AMMONIA in the last 8760 hours. CBC: Recent Labs    04/05/20 0549 07/24/20 0000 12/10/20 1113  WBC 11.1* 7.0 5.7  NEUTROABS  --  4,620 3,893  HGB 14.1 15.1 13.5  HCT 43.0 44.2 40.8  MCV 92.1 90.8 91.5  PLT 194 177 142   Lipid Panel: Recent Labs    07/24/20 0000 12/10/20 1113  CHOL 158 132  HDL 35* 31*  LDLCALC 89 75  TRIG 260* 161*  CHOLHDL 4.5 4.3   TSH: Recent Labs    12/10/20 1113  TSH 2.25   A1C: No results found for: HGBA1C   Assessment/Plan 1. Primary hypertension Blood pressure is adequately controlled.  I did recommend taking one of his medicines in the a.m. and 1 in the PM for more even coverage  2. Dizziness Probably multifactorial.  There is some effusion in the middle ear but this also may be a circulatory issue for which there is not much help.  I did suggest regular exercises that he already has done that included modification of the Epley as well as some vestibular exercises  3. Generalized anxiety disorder Headaches may be related to increased anxiety which seems to be connected to a dependence on his wife for various things that he would like to be  able to do himself.  Suggested increasing BuSpar to 10 mg twice a day    07/26/20, MD [facility] [phone]

## 2021-01-23 ENCOUNTER — Telehealth: Payer: Self-pay

## 2021-01-23 NOTE — Telephone Encounter (Signed)
Patients spouse called for clarification on Buspirone medication. Patient was told to take buspirone 5mg  two times daily. Patient has already been taking this and is unsure if they need to do an additional 5 mg or keep original two times daily of the 5mg  tabs.  Please clarify for patient.  Message routed to Dr. 

## 2021-01-24 ENCOUNTER — Telehealth: Payer: Self-pay

## 2021-01-24 DIAGNOSIS — R42 Dizziness and giddiness: Secondary | ICD-10-CM

## 2021-01-24 DIAGNOSIS — H9193 Unspecified hearing loss, bilateral: Secondary | ICD-10-CM

## 2021-01-24 MED ORDER — BUSPIRONE HCL 10 MG PO TABS: 10.0000 mg | ORAL_TABLET | Freq: Two times a day (BID) | ORAL | 1 refills | Status: DC

## 2021-01-24 NOTE — Telephone Encounter (Signed)
ENT referral ordered to [facility]

## 2021-01-24 NOTE — Telephone Encounter (Signed)
Patients wife stopped by the office, I updated medication list according to Dr.Miller's instructions as it was not updated at the appointment. RX sent to local pharmacy for 90 day supply.   Going forward we should update the medication list at the time of medication change to decrease confusion and delay.

## 2021-01-24 NOTE — Telephone Encounter (Signed)
Called patient to explain, no answer. Left voicemail for patient to return call.

## 2021-01-24 NOTE — Telephone Encounter (Addendum)
Incoming call received from patients spouse requesting a referral to [facility] for audiology concerns. Per Mrs.Kader patient was seen at this location before, yet due to the time laspe he was told he would need a new referral.    Reason for referral: Ongoing, long term hearing loss. Patient wears hearing aids.  Message routed to Ngetich, Donalee Citrin, NP to review request and advise  I have pended the referral and added all pertinent information to process referral

## 2021-01-28 ENCOUNTER — Telehealth: Payer: Self-pay

## 2021-01-28 NOTE — Telephone Encounter (Signed)
Patients wife called stating that patient has continued having dizziness after being told he has fluid in ears at last appointment. Patient would like to know if there is anything he could take for the dizziness.  Please advise. Thank you.  Message routed to Abbey Chatters, NP.

## 2021-01-28 NOTE — Telephone Encounter (Signed)
Can try to take an antihistamine such as Claritin 10 mg by mouth daily. Would continue exercises discussed by Dr Hyacinth Meeker. It looks like Jeffery [name] also has put in a referral to ENT

## 2021-01-28 NOTE — Telephone Encounter (Signed)
Called wife and informed she stated he has also already started taking claritin as well and are awaiting the ENT referral. Message routed to Abbey Chatters, NP

## 2021-02-06 ENCOUNTER — Ambulatory Visit: Payer: Medicare HMO | Admitting: Family

## 2021-02-21 ENCOUNTER — Encounter: Payer: Self-pay | Admitting: Family

## 2021-02-21 ENCOUNTER — Ambulatory Visit (INDEPENDENT_AMBULATORY_CARE_PROVIDER_SITE_OTHER): Payer: Medicare HMO | Admitting: Family

## 2021-02-21 ENCOUNTER — Other Ambulatory Visit: Payer: Self-pay

## 2021-02-21 VITALS — BP 144/92 | HR 61 | Temp 96.8°F | Ht 70.0 in | Wt 256.2 lb

## 2021-02-21 DIAGNOSIS — G8929 Other chronic pain: Secondary | ICD-10-CM

## 2021-02-21 DIAGNOSIS — E785 Hyperlipidemia, unspecified: Secondary | ICD-10-CM | POA: Diagnosis not present

## 2021-02-21 DIAGNOSIS — Z9989 Dependence on other enabling machines and devices: Secondary | ICD-10-CM

## 2021-02-21 DIAGNOSIS — I10 Essential (primary) hypertension: Secondary | ICD-10-CM | POA: Diagnosis not present

## 2021-02-21 DIAGNOSIS — B009 Herpesviral infection, unspecified: Secondary | ICD-10-CM | POA: Diagnosis not present

## 2021-02-21 DIAGNOSIS — J984 Other disorders of lung: Secondary | ICD-10-CM

## 2021-02-21 DIAGNOSIS — M25512 Pain in left shoulder: Secondary | ICD-10-CM

## 2021-02-21 DIAGNOSIS — I2510 Atherosclerotic heart disease of native coronary artery without angina pectoris: Secondary | ICD-10-CM

## 2021-02-21 DIAGNOSIS — F411 Generalized anxiety disorder: Secondary | ICD-10-CM

## 2021-02-21 DIAGNOSIS — F331 Major depressive disorder, recurrent, moderate: Secondary | ICD-10-CM

## 2021-02-21 DIAGNOSIS — R42 Dizziness and giddiness: Secondary | ICD-10-CM | POA: Diagnosis not present

## 2021-02-21 DIAGNOSIS — G4733 Obstructive sleep apnea (adult) (pediatric): Secondary | ICD-10-CM

## 2021-02-21 MED ORDER — SALONPAS LIDOCAINE PLUS 4-10 % EX CREA: 1.0000 "application " | TOPICAL_CREAM | Freq: Every day | CUTANEOUS | 3 refills | Status: DC

## 2021-02-21 MED ORDER — MECLIZINE HCL 25 MG PO TABS: 25.0000 mg | ORAL_TABLET | Freq: Three times a day (TID) | ORAL | 1 refills | Status: DC | PRN

## 2021-02-21 NOTE — Progress Notes (Signed)
Provider: Makyi Ledo FNP-C   Wardell Honour, MD  Patient Care Team: Wardell Honour, MD as PCP - General [facility] Medicine)  Extended Emergency Contact Information Primary Emergency Contact: [facility] Address: [address] [place], [place] [address] [name] of [place] Phone: [phone] Mobile Phone: [phone] Relation: Spouse Secondary Emergency Contact: [name] Address: [address]          [place], [place] [address] [name] of [place] Phone: [phone] Mobile Phone: [phone] Relation: Daughter  Code Status:  Full Code  Goals of care: Advanced Directive information Advanced Directives 02/21/2021  Does Patient Have a Medical Advance Directive? Yes  Type of Paramedic of [place];Living will  Does patient want to make changes to medical advance directive? No - Patient declined  Copy of [place] in Chart? Yes - validated most recent copy scanned in chart (See row information)     Chief Complaint  Patient presents with   Medical Management of Chronic Issues    3 month follow up.   Immunizations    Discuss the need for infuenza vaccine.     HPI:  Pt is a 85 y.o. male seen today for 4 months follow up medical management of chronic diseases.Has a medical history of Hypertension,CAD x 2 stent,Hyperlipemia, COPD,Generalized Anxiety disorder,BPH,ED,Depression,GERD,Glaucoma,HOH,Memory impairment, Osteoarthritis,OSA- CPAP among other conditions.  Hypertension - no home readings for review.chronic dizziness.denies any headache,vision changes,fatigue,chest tightness,palpitation,chest pain or shortness of breath.    Dizziness - described as feeling of going to fall.No recent fall episodes.Has been evaluated by vestibular specialist.Has also worked with Physical Therapy.needs help with socks because of dizziness.Holds the wall when going to the bathroom.  States  everything is frustrating.  CAD - s/p stent placement x 2 follows up with [facility] cardiologist. Has been off prasugrel since oct 2020.   Hyperlipidemia - latest Chol was normal 132,TRG 161 and normal LDL 75   COPD - follows up with pulmonologist at [facility] Dr. Limmie Patricia Ejaz [facility]   OSA - uses CPAP during the day and tries to sleep on the side at night.trying to get another mask at the [facility] has upcoming appointment.   Backache - continues to follow up with [facility].Has had nerve ablation for the neck and spine stimulator.[facility] [place] [facility]2021 for arthropathy of cervical.states chronic neck pain from previous injury at work in [place] - left shoulder worst.Takes tylenol but does not help.does not like strong stuff.sits most time with heating pad on the back and shoulders.    BPH - urine incontinence.PSA elevated 33  follows up with urologist Dr.Hall at the [facility]  ED - does not like not having the drive.would like viagra but was not cleared by Cardiologist.ED affecting his relationship with the wife.   Depression /Anxiety - Buspar was increased to 10 mg tablet twice daily by PCP Dr.Miller 01/22/2021.Frustrated that he cannot help the wife around the house/garden or even have a [place] dog that he likes since he cannot walk the dog due to his overall health condition with shortness of breath.does not want to leave all the work to the husband.  States all guns have been locked in the house though states he is not suicidal or has any intension to harm self or others. Decline feeling depressed.He sees a Transport planner for counseling which has helped.  Has  lost 27 lbs since last visit. Congratulated during the visit for weight loss.states has cutdown on lots of stuff that he was eating.   Past Medical History:  Diagnosis Date   Anxiety    BPH (benign prostatic hyperplasia)    Coronary artery disease     Depression    Dizziness    Dyspnea    with exertion    Fecal incontinence    GERD (gastroesophageal reflux disease)    Glaucoma    Hard of hearing    Headache    History of pneumonia    Hyperlipidemia    Hypertension    Memory impairment    Migraine headache    Neck fracture ([place])    2-3/2-21 followed by DR Ashok Croon in [place]    Nocturia    Osteoarthritis    Restrictive lung disease    Sleep apnea    wears CPAP does not know setting    Thyroid disease    Hyperparathyroidism    Urinary frequency    Urinary incontinence    Wears glasses    Past Surgical History:  Procedure Laterality Date   CARDIAC CATHETERIZATION     CIRCUMCISION     COLONOSCOPY     CORONARY ANGIOPLASTY     stents- 08/2019    ESOPHAGOGASTRODUODENOSCOPY     EYE SURGERY Bilateral    cataract   HAND SURGERY Right    HIP ARTHROPLASTY Right    HIP ARTHROPLASTY Left 2006   KNEE CARTILAGE SURGERY Bilateral    LUMBAR LAMINECTOMY     L3 - L4   RETINAL DETACHMENT SURGERY Right    x2   REVERSE SHOULDER ARTHROPLASTY Right 05/29/2016   Procedure: REVERSE SHOULDER ARTHROPLASTY;  Surgeon: Justice Britain, MD;  Location: [facility];  Service: Orthopedics;  Laterality: Right;   SHOULDER SURGERY Bilateral    TOTAL HIP REVISION Right 04/04/2020   Procedure: Right hip acetabular liner revision;  Surgeon: Gaynelle Arabian, MD;  Location: [facility];  Service: Orthopedics;  Laterality: Right;  41mn    Allergies  Allergen Reactions   Losartan Other (See Comments)    Allergies as of 02/21/2021       Reactions   Losartan Other (See Comments)        Medication List        Accurate as of February 21, 2021  9:46 PM. If you have any questions, ask your nurse or doctor.          STOP taking these medications    acyclovir 800 MG tablet Commonly known as: ZOVIRAX Stopped by: DSandrea Hughs NP       TAKE these medications    amLODipine 10 MG tablet Commonly known as: NORVASC Take 1 tablet (10 mg total) by  mouth daily.   aspirin 81 MG tablet Take 81 mg by mouth in the morning and at bedtime.   atorvastatin 40 MG tablet Commonly known as: LIPITOR Take 1 tablet (40 mg total) by mouth every evening.   busPIRone 10 MG tablet Commonly known as: BUSPAR Take 1 tablet (10 mg total) by mouth 2 (two) times daily.   colestipol 1 g tablet Commonly known as: COLESTID Take 1 tablet (1 g total) by mouth 2 (two) times daily.   furosemide 20 MG tablet Commonly known as: LASIX Take 20 mg by mouth daily.   gabapentin 600 MG tablet Commonly known as: NEURONTIN Take 600 mg by mouth 3 (three) times daily.   latanoprost 0.005 % ophthalmic solution Commonly known  as: XALATAN Place 1 drop into both eyes at bedtime.   losartan 25 MG tablet Commonly known as: COZAAR Take 1 tablet (25 mg total) by mouth daily.   magnesium oxide 400 MG tablet Commonly known as: MAG-OX Take 400 mg by mouth daily.   meclizine 25 MG tablet Commonly known as: ANTIVERT Take 1 tablet (25 mg total) by mouth 3 (three) times daily as needed for dizziness. Started by: Sandrea Hughs, NP   mometasone 220 MCG/INH inhaler Commonly known as: ASMANEX Inhale 2 puffs into the lungs at bedtime.   omeprazole 20 MG capsule Commonly known as: PRILOSEC Take 1 capsule (20 mg total) by mouth daily as needed (acid reflux).   potassium chloride SA 20 MEQ tablet Commonly known as: KLOR-CON Take 1 tablet (20 mEq total) by mouth daily.   prasugrel 10 MG Tabs tablet Commonly known as: EFFIENT Take 10 mg by mouth daily.   PRESCRIPTION MEDICATION Inhale 1 puff into the lungs at bedtime. Unknown inhaler (patient states he will bring with him to his upcoming pre operative appointment)   PROBIOTIC PO Take 1 capsule by mouth daily.   Salonpas Lidocaine Plus 4-10 % Crea Generic drug: Lidocaine HCl-Benzyl Alcohol Apply 1 application topically daily. Started by: Sandrea Hughs, NP   terazosin 5 MG capsule Commonly known as:  HYTRIN Take 1 capsule (5 mg total) by mouth every evening.   timolol 0.5 % ophthalmic solution Commonly known as: TIMOPTIC Place 1 drop into both eyes 2 (two) times daily.   TUMS PO Take by mouth.   vitamin B-12 1000 MCG tablet Commonly known as: CYANOCOBALAMIN Take 1,000 mcg by mouth daily.   Vitamin D 50 MCG (2000 UT) Caps Take 2,000 Units by mouth daily.        Review of Systems  Constitutional:  Negative for appetite change, chills, fatigue, fever and unexpected weight change.  HENT:  Positive for hearing loss. Negative for congestion, dental problem, ear discharge, ear pain, facial swelling, nosebleeds, postnasal drip, rhinorrhea, sinus pressure, sinus pain, sneezing, sore throat, tinnitus and trouble swallowing.   Eyes:  Positive for visual disturbance. Negative for pain, discharge, redness and itching.       Glaucoma - follows up with Ophthalmology Dr. Teola Bradley   Respiratory:  Negative for cough, chest tightness, shortness of breath and wheezing.   Cardiovascular:  Negative for chest pain, palpitations and leg swelling.  Gastrointestinal:  Negative for abdominal distention, abdominal pain, blood in stool, constipation, diarrhea, nausea and vomiting.  Endocrine: Negative for cold intolerance, heat intolerance, polydipsia, polyphagia and polyuria.  Genitourinary:  Positive for frequency. Negative for difficulty urinating, dysuria, flank pain and urgency.       BPH and ED sees urologist Dr.Hall at the [facility]   Musculoskeletal:  Positive for arthralgias, back pain, gait problem and neck pain. Negative for joint swelling, myalgias and neck stiffness.  Skin:  Negative for color change, pallor, rash and wound.  Neurological:  Negative for syncope, speech difficulty, weakness, light-headedness and numbness.       Chronic dizziness and headache   Hematological:  Does not bruise/bleed easily.  Psychiatric/Behavioral:  Negative for agitation, behavioral problems, confusion,  hallucinations, self-injury, sleep disturbance and suicidal ideas. The patient is nervous/anxious.    Immunization History  Administered Date(s) Administered   Influenza Split 04/30/2002, 04/12/2003, 04/05/2004, 04/30/2005, 04/21/2006, 05/03/2007, 03/30/2008, 03/30/2009, 03/27/2010, 05/07/2011, 03/30/2012, 02/28/2013, 05/31/2014, 04/13/2016, 05/13/2016, 04/24/2017, 04/19/2018   Influenza, High Dose Seasonal PF 03/05/2015, 03/05/2015, 05/13/2016, 04/24/2017, 04/27/2018, 03/23/2019  Influenza, Seasonal, Injecte, Preservative Fre 04/19/2018   Influenza-Unspecified 04/30/2002, 04/12/2003, 04/05/2004, 04/30/2005, 04/21/2006, 05/03/2007, 03/30/2008, 03/30/2009, 03/27/2010, 05/07/2011, 03/30/2012, 02/28/2013, 05/31/2014, 04/13/2016, 04/19/2018, 03/31/2019, 04/19/2020   Moderna Sars-Covid-2 Vaccination 07/13/2019, 08/10/2019, 04/27/2020, 02/11/2021   Pneumococcal Conjugate-13 04/12/2003, 10/18/2010, 01/22/2012, 12/22/2013   Pneumococcal Polysaccharide-23 11/22/2004   Pneumococcal-Unspecified 04/12/2003, 01/22/2012   Td 02/28/2004   Tdap 02/28/2004, 08/31/2014, 09/08/2016, 06/21/2020   Zoster Recombinat (Shingrix) 12/27/2018, 05/11/2019   Zoster, Live 07/30/2011   Pertinent  Health Maintenance Due  Topic Date Due   INFLUENZA VACCINE  01/28/2021   PNA vac Low Risk Adult  Completed   Fall Risk  02/21/2021 02/21/2021 01/22/2021 12/13/2020 07/24/2020  Falls in the past year? 0 0 0 0 0  Number falls in past yr: 0 0 0 0 0  Injury with Fall? - 0 0 0 0  Risk for fall due to : - No Fall Risks History of fall(s) - -  Follow up Falls evaluation completed Falls evaluation completed Falls evaluation completed;Education provided;Falls prevention discussed - -   Functional Status Survey:    Vitals:   02/21/21 1432  BP: (!) 144/92  Pulse: 61  Temp: (!) 96.8 F (36 C)  SpO2: 95%  Weight: 256 lb 3.2 oz (116.2 kg)  Height: _0  (1.778 m)   Body mass index is 36.76 kg/m. Physical Exam Vitals reviewed.   Constitutional:      General: He is not in acute distress.    Appearance: Normal appearance. He is obese. He is not ill-appearing or diaphoretic.  HENT:     Head: Normocephalic.     Right Ear: Tympanic membrane, ear canal and external ear normal. There is no impacted cerumen.     Left Ear: Tympanic membrane, ear canal and external ear normal. There is no impacted cerumen.     Ears:     Comments: Hearing aids     Nose: Nose normal. No congestion or rhinorrhea.     Mouth/Throat:     Mouth: Mucous membranes are moist.     Pharynx: Oropharynx is clear. No oropharyngeal exudate or posterior oropharyngeal erythema.  Eyes:     General: No scleral icterus.       Right eye: No discharge.        Left eye: No discharge.     Extraocular Movements: Extraocular movements intact.     Conjunctiva/sclera: Conjunctivae normal.     Pupils: Pupils are equal, round, and reactive to light.  Neck:     Vascular: No carotid bruit.  Cardiovascular:     Rate and Rhythm: Normal rate and regular rhythm.     Pulses: Normal pulses.     Heart sounds: Normal heart sounds. No murmur heard.   No friction rub. No gallop.  Pulmonary:     Effort: Pulmonary effort is normal. No respiratory distress.     Breath sounds: Normal breath sounds. No wheezing, rhonchi or rales.  Chest:     Chest wall: No tenderness.  Abdominal:     General: Bowel sounds are normal. There is no distension.     Palpations: Abdomen is soft. There is no mass.     Tenderness: There is no abdominal tenderness. There is no right CVA tenderness, left CVA tenderness, guarding or rebound.  Musculoskeletal:        General: No swelling or tenderness. Normal range of motion.     Cervical back: Normal range of motion. No rigidity or tenderness.     Right lower leg: No edema.  Left lower leg: No edema.     Comments: Walks with Rolator   Lymphadenopathy:     Cervical: No cervical adenopathy.  Skin:    General: Skin is warm and dry.      Coloration: Skin is not pale.     Findings: No bruising, erythema, lesion or rash.  Neurological:     Mental Status: He is alert and oriented to person, place, and time.     Cranial Nerves: No cranial nerve deficit.     Sensory: No sensory deficit.     Motor: No weakness.     Coordination: Coordination normal.     Gait: Gait abnormal.  Psychiatric:        Mood and Affect: Mood normal.        Speech: Speech normal.        Behavior: Behavior normal.        Thought Content: Thought content normal.        Judgment: Judgment normal.    Labs reviewed: Recent Labs    04/05/20 0311 07/24/20 0000 12/10/20 1113  NA 140 141 143  K 3.1* 3.9 4.2  CL 109 106 110  CO2 21* 25 24  GLUCOSE 153* 75 98  BUN _0 CREATININE 1.10 1.05 1.27*  CALCIUM 8.4* 9.3 9.2   Recent Labs    03/26/20 1441 07/24/20 0000 12/10/20 1113  AST _1 ALT _2 ALKPHOS 69  --   --   BILITOT 0.7 0.5 0.6  PROT 6.7 6.7 6.2  ALBUMIN 3.8  --   --    Recent Labs    04/05/20 0549 07/24/20 0000 12/10/20 1113  WBC 11.1* 7.0 5.7  NEUTROABS  --  4,620 3,893  HGB 14.1 15.1 13.5  HCT 43.0 44.2 40.8  MCV 92.1 90.8 91.5  PLT 194 177 142   Lab Results  Component Value Date   TSH 2.25 12/10/2020   No results found for: HGBA1C Lab Results  Component Value Date   CHOL 132 12/10/2020   HDL 31 (L) 12/10/2020   LDLCALC 75 12/10/2020   TRIG 161 (H) 12/10/2020   CHOLHDL 4.3 12/10/2020    Significant Diagnostic Results in last 30 days:  No results found.  Assessment/Plan 1. Essential hypertension B/p stable improved compared to previous. - continue on losartan,Amlodipine and Furosemide  - CBC with Differential/Platelet; Future - CMP with eGFR([facility]); Future - TSH; Future  2. Hyperlipidemia LDL goal <100 LDL at goal  - Dietary modification and exercise at least 3 times per week for 30 minutes advised.Water Aerobic exercises recommended.  - additional DASH Eating plan Education information  provided on AVS  - Lipid panel; Future  3. Herpes simplex No recent break out.no longer taking Acyclovir   4. Dizziness Intermittent symptoms which frustrate him due to inability to do much of his ADL's without assistance from wife.  Would like to try meclizine.SE discussed.  - CBC with Differential/Platelet; Future - meclizine (ANTIVERT) 25 MG tablet; Take 1 tablet (25 mg total) by mouth 3 (three) times daily as needed for dizziness.  Dispense: 30 tablet; Refill: 1  5. Coronary artery disease involving native coronary artery of native heart without angina pectoris Chest pain free. Continue on ASA 81 mg tablet and atorvastatin  - continue to follow up with Cardiologist - dietary and exercise modification as above    6. OSA on CPAP Continue with C-Pap machine  - continue to follow up with Pulmonologist  7. Restrictive lung disease Breathing stable at rest  Bilateral lungs CTA  Continue to follow up with Pulmonologist  - continue on Mometasone inhaler   8. Moderate episode of recurrent major depressive disorder (HCC) Mood stable. Continue to follow up with Therapist   9. Generalized anxiety disorder Continue on Buspar   10. Chronic left shoulder pain Advised to apply salon pas as below. Will refer to Ortho for evaluation.  - Ambulatory referral to Orthopedic Surgery - Lidocaine HCl-Benzyl Alcohol (SALONPAS LIDOCAINE PLUS) 4-10 % CREA; Apply 1 application topically daily.  Dispense: 85 g; Refill: 3  Family/ staff Communication: Reviewed plan of care with patient and wife verbalized understanding.  Labs/tests ordered:  - CBC with Differential/Platelet - CMP with eGFR([facility]) - TSH - Lipid panel    Next Appointment :   Sandrea Hughs, NP

## 2021-02-21 NOTE — Patient Instructions (Signed)
https://www.nhlbi.nih.gov/files/docs/public/heart/dash_brief.pdf">  DASH Eating Plan DASH stands for Dietary Approaches to Stop Hypertension. The DASH eating plan is a healthy eating plan that has been shown to: Reduce high blood pressure (hypertension). Reduce your risk for type 2 diabetes, heart disease, and stroke. Help with weight loss. What are tips for following this plan? Reading food labels Check food labels for the amount of salt (sodium) per serving. Choose foods with less than 5 percent of the Daily Value of sodium. Generally, foods with less than 300 milligrams (mg) of sodium per serving fit into this eating plan. To find whole grains, look for the word "whole" as the first word in the ingredient list. Shopping Buy products labeled as "low-sodium" or "no salt added." Buy fresh foods. Avoid canned foods and pre-made or frozen meals. Cooking Avoid adding salt when cooking. Use salt-free seasonings or herbs instead of table salt or sea salt. Check with your health care provider or pharmacist before using salt substitutes. Do not fry foods. Cook foods using healthy methods such as baking, boiling, grilling, roasting, and broiling instead. Cook with heart-healthy oils, such as olive, canola, avocado, soybean, or sunflower oil. Meal planning  Eat a balanced diet that includes: 4 or more servings of fruits and 4 or more servings of vegetables each day. Try to fill one-half of your plate with fruits and vegetables. 6-8 servings of whole grains each day. Less than 6 oz (170 g) of lean meat, poultry, or fish each day. A 3-oz (85-g) serving of meat is about the same size as a deck of cards. One egg equals 1 oz (28 g). 2-3 servings of low-fat dairy each day. One serving is 1 cup (237 mL). 1 serving of nuts, seeds, or beans 5 times each week. 2-3 servings of heart-healthy fats. Healthy fats called omega-3 fatty acids are found in foods such as walnuts, flaxseeds, fortified milks, and eggs.  These fats are also found in cold-water fish, such as sardines, salmon, and mackerel. Limit how much you eat of: Canned or prepackaged foods. Food that is high in trans fat, such as some fried foods. Food that is high in saturated fat, such as fatty meat. Desserts and other sweets, sugary drinks, and other foods with added sugar. Full-fat dairy products. Do not salt foods before eating. Do not eat more than 4 egg yolks a week. Try to eat at least 2 vegetarian meals a week. Eat more home-cooked food and less restaurant, buffet, and fast food.  Lifestyle When eating at a restaurant, ask that your food be prepared with less salt or no salt, if possible. If you drink alcohol: Limit how much you use to: 0-1 drink a day for women who are not pregnant. 0-2 drinks a day for men. Be aware of how much alcohol is in your drink. In the [place] one drink equals one 12 oz bottle of beer (355 mL), one 5 oz glass of wine (148 mL), or one 1 oz glass of hard liquor (44 mL). General information Avoid eating more than 2,300 mg of salt a day. If you have hypertension, you may need to reduce your sodium intake to 1,500 mg a day. Work with your health care provider to maintain a healthy body weight or to lose weight. Ask what an ideal weight is for you. Get at least 30 minutes of exercise that causes your heart to beat faster (aerobic exercise) most days of the week. Activities may include walking, swimming, or biking. Work with your health care provider   or dietitian to adjust your eating plan to your individual calorie needs. What foods should I eat? Fruits All fresh, dried, or frozen fruit. Canned fruit in natural juice (without addedsugar). Vegetables Fresh or frozen vegetables (raw, steamed, roasted, or grilled). Low-sodium or reduced-sodium tomato and vegetable juice. Low-sodium or reduced-sodium tomatosauce and tomato paste. Low-sodium or reduced-sodium canned vegetables. Grains Whole-grain or  whole-wheat bread. Whole-grain or whole-wheat pasta. Brown rice. Oatmeal. Quinoa. Bulgur. Whole-grain and low-sodium cereals. Pita bread.Low-fat, low-sodium crackers. Whole-wheat flour tortillas. Meats and other proteins Skinless chicken or turkey. Ground chicken or turkey. Pork with fat trimmed off. Fish and seafood. Egg whites. Dried beans, peas, or lentils. Unsalted nuts, nut butters, and seeds. Unsalted canned beans. Lean cuts of beef with fat trimmed off. Low-sodium, lean precooked or cured meat, such as sausages or meatloaves. Dairy Low-fat (1%) or fat-free (skim) milk. Reduced-fat, low-fat, or fat-free cheeses. Nonfat, low-sodium ricotta or cottage cheese. Low-fat or nonfatyogurt. Low-fat, low-sodium cheese. Fats and oils Soft margarine without trans fats. Vegetable oil. Reduced-fat, low-fat, or light mayonnaise and salad dressings (reduced-sodium). Canola, safflower, olive, avocado, soybean, andsunflower oils. Avocado. Seasonings and condiments Herbs. Spices. Seasoning mixes without salt. Other foods Unsalted popcorn and pretzels. Fat-free sweets. The items listed above may not be a complete list of foods and beverages you can eat. Contact a dietitian for more information. What foods should I avoid? Fruits Canned fruit in a light or heavy syrup. Fried fruit. Fruit in cream or buttersauce. Vegetables Creamed or fried vegetables. Vegetables in a cheese sauce. Regular canned vegetables (not low-sodium or reduced-sodium). Regular canned tomato sauce and paste (not low-sodium or reduced-sodium). Regular tomato and vegetable juice(not low-sodium or reduced-sodium). Pickles. Olives. Grains Baked goods made with fat, such as croissants, muffins, or some breads. Drypasta or rice meal packs. Meats and other proteins Fatty cuts of meat. Ribs. Fried meat. Bacon. Bologna, salami, and other precooked or cured meats, such as sausages or meat loaves. Fat from the back of a pig (fatback). Bratwurst.  Salted nuts and seeds. Canned beans with added salt. Canned orsmoked fish. Whole eggs or egg yolks. Chicken or turkey with skin. Dairy Whole or 2% milk, cream, and half-and-half. Whole or full-fat cream cheese. Whole-fat or sweetened yogurt. Full-fat cheese. Nondairy creamers. Whippedtoppings. Processed cheese and cheese spreads. Fats and oils Butter. Stick margarine. Lard. Shortening. Ghee. Bacon fat. Tropical oils, suchas coconut, palm kernel, or palm oil. Seasonings and condiments Onion salt, garlic salt, seasoned salt, table salt, and sea salt. Worcestershire sauce. Tartar sauce. Barbecue sauce. Teriyaki sauce. Soy sauce, including reduced-sodium. Steak sauce. Canned and packaged gravies. Fish sauce. Oyster sauce. Cocktail sauce. Store-bought horseradish. Ketchup. Mustard. Meat flavorings and tenderizers. Bouillon cubes. Hot sauces. Pre-made or packaged marinades. Pre-made or packaged taco seasonings. Relishes. Regular saladdressings. Other foods Salted popcorn and pretzels. The items listed above may not be a complete list of foods and beverages you should avoid. Contact a dietitian for more information. Where to find more information National Heart, Lung, and Blood Institute: www.nhlbi.nih.gov American Heart Association: www.heart.org Academy of Nutrition and Dietetics: www.eatright.org National Kidney Foundation: www.kidney.org Summary The DASH eating plan is a healthy eating plan that has been shown to reduce high blood pressure (hypertension). It may also reduce your risk for type 2 diabetes, heart disease, and stroke. When on the DASH eating plan, aim to eat more fresh fruits and vegetables, whole grains, lean proteins, low-fat dairy, and heart-healthy fats. With the DASH eating plan, you should limit salt (sodium) intake to 2,300   mg a day. If you have hypertension, you may need to reduce your sodium intake to 1,500 mg a day. Work with your health care provider or dietitian to adjust  your eating plan to your individual calorie needs. This information is not intended to replace advice given to you by your health care provider. Make sure you discuss any questions you have with your healthcare provider. Document Revised: 05/20/2019 Document Reviewed: 05/20/2019 Elsevier Patient Education  2022 Elsevier Inc.  

## 2021-03-08 ENCOUNTER — Encounter: Payer: Self-pay | Admitting: Adult Health

## 2021-03-08 ENCOUNTER — Ambulatory Visit (INDEPENDENT_AMBULATORY_CARE_PROVIDER_SITE_OTHER): Payer: Medicare HMO | Admitting: Adult Health

## 2021-03-08 ENCOUNTER — Other Ambulatory Visit: Payer: Self-pay

## 2021-03-08 VITALS — BP 110/80 | HR 63 | Temp 97.3°F | Ht 70.0 in | Wt 294.2 lb

## 2021-03-08 DIAGNOSIS — R42 Dizziness and giddiness: Secondary | ICD-10-CM

## 2021-03-08 MED ORDER — MECLIZINE HCL 25 MG PO TABS: 25.0000 mg | ORAL_TABLET | Freq: Two times a day (BID) | ORAL | 0 refills | Status: DC

## 2021-03-08 MED ORDER — MECLIZINE HCL 25 MG PO TABS: 25.0000 mg | ORAL_TABLET | Freq: Every day | ORAL | 0 refills | Status: DC | PRN

## 2021-03-08 NOTE — Patient Instructions (Signed)

## 2021-03-08 NOTE — Progress Notes (Signed)
[facility] clinic  Provider:   Durenda Age DNP  Code Status:  Full Code  Goals of Care:  Advanced Directives 02/21/2021  Does Patient Have a Medical Advance Directive? Yes  Type of Paramedic of [place];Living will  Does patient want to make changes to medical advance directive? No - Patient declined  Copy of [place] in Chart? Yes - validated most recent copy scanned in chart (See row information)     Chief Complaint  Patient presents with   Acute Visit    Patient complains of dizziness everyday. Patient having trouble with standing due to dizziness. Patient also having headache. Patient took meclizine which did not help.Nothing seems to help the dizziness,but sitting still    HPI: Patient is a 85 y.o. male seen today for an acute visit for dizziness. He came to the clinic with his wife. He was noted to be using a walker. Wife stated that yesterday, he was not able to stand up from a sitting position due to dizziness. Wife stated that he was too dizzy that he has to use a urinal yesterday. He has an order for Meclizine 25 mg TID PRN for dizziness.   Past Medical History:  Diagnosis Date   Anxiety    BPH (benign prostatic hyperplasia)    Coronary artery disease    Depression    Dizziness    Dyspnea    with exertion    Fecal incontinence    GERD (gastroesophageal reflux disease)    Glaucoma    Hard of hearing    Headache    History of pneumonia    Hyperlipidemia    Hypertension    Memory impairment    Migraine headache    Neck fracture ([place])    2-3/2-21 followed by DR Ashok Croon in [place]    Nocturia    Osteoarthritis    Restrictive lung disease    Sleep apnea    wears CPAP does not know setting    Thyroid disease    Hyperparathyroidism    Urinary frequency    Urinary incontinence    Wears glasses     Past Surgical History:  Procedure Laterality Date   CARDIAC CATHETERIZATION     CIRCUMCISION      COLONOSCOPY     CORONARY ANGIOPLASTY     stents- 08/2019    ESOPHAGOGASTRODUODENOSCOPY     EYE SURGERY Bilateral    cataract   HAND SURGERY Right    HIP ARTHROPLASTY Right    HIP ARTHROPLASTY Left 2006   KNEE CARTILAGE SURGERY Bilateral    LUMBAR LAMINECTOMY     L3 - L4   RETINAL DETACHMENT SURGERY Right    x2   REVERSE SHOULDER ARTHROPLASTY Right 05/29/2016   Procedure: REVERSE SHOULDER ARTHROPLASTY;  Surgeon: Justice Britain, MD;  Location: [facility];  Service: Orthopedics;  Laterality: Right;   SHOULDER SURGERY Bilateral    TOTAL HIP REVISION Right 04/04/2020   Procedure: Right hip acetabular liner revision;  Surgeon: Gaynelle Arabian, MD;  Location: [facility];  Service: Orthopedics;  Laterality: Right;  60min    Allergies  Allergen Reactions   Losartan Other (See Comments)    Outpatient Encounter Medications as of 03/08/2021  Medication Sig   amLODipine (NORVASC) 10 MG tablet Take 1 tablet (10 mg total) by mouth daily.   aspirin 81 MG tablet Take 81 mg by mouth in the morning and at bedtime.   atorvastatin (LIPITOR) 40 MG tablet Take 1 tablet (40  mg total) by mouth every evening.   busPIRone (BUSPAR) 10 MG tablet Take 1 tablet (10 mg total) by mouth 2 (two) times daily.   Calcium Carbonate Antacid (TUMS PO) Take by mouth.   Cholecalciferol (VITAMIN D) 2000 units CAPS Take 2,000 Units by mouth daily.   colestipol (COLESTID) 1 g tablet Take 1 tablet (1 g total) by mouth 2 (two) times daily.   furosemide (LASIX) 20 MG tablet Take 20 mg by mouth daily.    gabapentin (NEURONTIN) 600 MG tablet Take 600 mg by mouth 3 (three) times daily.   latanoprost (XALATAN) 0.005 % ophthalmic solution Place 1 drop into both eyes at bedtime.   losartan (COZAAR) 25 MG tablet Take 1 tablet (25 mg total) by mouth daily.   magnesium oxide (MAG-OX) 400 MG tablet Take 400 mg by mouth daily.   meclizine (ANTIVERT) 25 MG tablet Take 1 tablet (25 mg total) by mouth daily as needed for dizziness.   Omega-3 Fatty Acids  (FISH OIL) 500 MG CAPS Take 1 capsule by mouth daily.   omeprazole (PRILOSEC) 20 MG capsule Take 1 capsule (20 mg total) by mouth daily as needed (acid reflux).   potassium chloride SA (KLOR-CON) 20 MEQ tablet Take 1 tablet (20 mEq total) by mouth daily.   prasugrel (EFFIENT) 10 MG TABS tablet Take 10 mg by mouth daily.   Probiotic Product (PROBIOTIC PO) Take 1 capsule by mouth daily.   terazosin (HYTRIN) 5 MG capsule Take 1 capsule (5 mg total) by mouth every evening.   timolol (TIMOPTIC) 0.5 % ophthalmic solution Place 1 drop into both eyes 2 (two) times daily.   vitamin B-12 (CYANOCOBALAMIN) 1000 MCG tablet Take 1,000 mcg by mouth daily.   [DISCONTINUED] meclizine (ANTIVERT) 25 MG tablet Take 1 tablet (25 mg total) by mouth 3 (three) times daily as needed for dizziness.   meclizine (ANTIVERT) 25 MG tablet Take 1 tablet (25 mg total) by mouth 2 (two) times daily.   [DISCONTINUED] Lidocaine HCl-Benzyl Alcohol (SALONPAS LIDOCAINE PLUS) 4-10 % CREA Apply 1 application topically daily.   [DISCONTINUED] mometasone (ASMANEX) 220 MCG/INH inhaler Inhale 2 puffs into the lungs at bedtime.   [DISCONTINUED] PRESCRIPTION MEDICATION Inhale 1 puff into the lungs at bedtime. Unknown inhaler (patient states he will bring with him to his upcoming pre operative appointment)   No facility-administered encounter medications on file as of 03/08/2021.    Review of Systems:  Review of Systems  Constitutional:  Negative for activity change, chills and fever.  HENT:  Negative for congestion.   Eyes: Negative.   Respiratory:  Negative for cough and shortness of breath.   Neurological:  Positive for dizziness.  Psychiatric/Behavioral: Negative.     Health Maintenance  Topic Date Due   INFLUENZA VACCINE  01/28/2021   TETANUS/TDAP  06/21/2030   COVID-19 Vaccine  Completed   PNA vac Low Risk Adult  Completed   Zoster Vaccines- Shingrix  Completed   HPV VACCINES  Aged Out    Physical Exam: Vitals:   03/08/21  1441  BP: 110/80  Pulse: 63  Temp: (!) 97.3 F (36.3 C)  SpO2: 90%  Weight: 294 lb 3.2 oz (133.4 kg)  Height: $Remove'5\' 10"'XzVmrBF$  (1.778 m)   Body mass index is 42.21 kg/m. Physical Exam Constitutional:      Appearance: He is obese.  HENT:     Head: Normocephalic and atraumatic.     Nose: Nose normal.     Mouth/Throat:     Mouth: Mucous membranes  are moist.  Eyes:     Conjunctiva/sclera: Conjunctivae normal.  Cardiovascular:     Rate and Rhythm: Normal rate and regular rhythm.     Pulses: Normal pulses.     Heart sounds: Normal heart sounds.  Pulmonary:     Effort: Pulmonary effort is normal.     Breath sounds: Normal breath sounds.  Abdominal:     Palpations: Abdomen is soft.  Musculoskeletal:        General: No swelling or tenderness. Normal range of motion.  Skin:    General: Skin is warm and dry.  Neurological:     General: No focal deficit present.     Mental Status: He is alert and oriented to person, place, and time.  Psychiatric:        Mood and Affect: Mood normal.        Behavior: Behavior normal.        Thought Content: Thought content normal.        Judgment: Judgment normal.    Labs reviewed: Basic Metabolic Panel: Recent Labs    04/05/20 0311 07/24/20 0000 12/10/20 1113  NA 140 141 143  K 3.1* 3.9 4.2  CL 109 106 110  CO2 21* 25 24  GLUCOSE 153* 75 98  BUN $Re'23 18 21  'IHC$ CREATININE 1.10 1.05 1.27*  CALCIUM 8.4* 9.3 9.2  TSH  --   --  2.25   Liver Function Tests: Recent Labs    03/26/20 1441 07/24/20 0000 12/10/20 1113  AST $Re'15 23 17  'MhB$ ALT $R'12 20 15  'tB$ ALKPHOS 69  --   --   BILITOT 0.7 0.5 0.6  PROT 6.7 6.7 6.2  ALBUMIN 3.8  --   --    No results for input(s): LIPASE, AMYLASE in the last 8760 hours. No results for input(s): AMMONIA in the last 8760 hours. CBC: Recent Labs    04/05/20 0549 07/24/20 0000 12/10/20 1113  WBC 11.1* 7.0 5.7  NEUTROABS  --  4,620 3,893  HGB 14.1 15.1 13.5  HCT 43.0 44.2 40.8  MCV 92.1 90.8 91.5  PLT 194 177 142    Lipid Panel: Recent Labs    07/24/20 0000 12/10/20 1113  CHOL 158 132  HDL 35* 31*  LDLCALC 89 75  TRIG 260* 161*  CHOLHDL 4.5 4.3   No results found for: HGBA1C  Procedures since last visit: No results found.  Assessment/Plan  1. Dizziness -  discussed fall precautions - BMP with eGFR([facility]); Future - Magnesium; Future - meclizine (ANTIVERT) 25 MG tablet; Take 1 tablet (25 mg total) by mouth 2 (two) times daily.  Dispense: 60 tablet; Refill: 0  2. Morbid obesity ([place]) -   discussed weight loss/food preferences - Hemoglobin A1c; Future     Labs/tests ordered:   BMP with eGFR and Hemoglobin A1c  Next appt:  07/25/2021

## 2021-03-11 ENCOUNTER — Other Ambulatory Visit: Payer: Self-pay

## 2021-03-11 ENCOUNTER — Other Ambulatory Visit: Payer: Medicare HMO

## 2021-03-11 DIAGNOSIS — R42 Dizziness and giddiness: Secondary | ICD-10-CM

## 2021-03-11 DIAGNOSIS — E785 Hyperlipidemia, unspecified: Secondary | ICD-10-CM

## 2021-03-12 ENCOUNTER — Encounter: Payer: Self-pay | Admitting: Family Medicine

## 2021-03-12 ENCOUNTER — Ambulatory Visit (INDEPENDENT_AMBULATORY_CARE_PROVIDER_SITE_OTHER): Payer: Medicare HMO | Admitting: Family Medicine

## 2021-03-12 VITALS — BP 130/70 | HR 67 | Temp 96.8°F | Ht 70.0 in | Wt 292.0 lb

## 2021-03-12 DIAGNOSIS — R42 Dizziness and giddiness: Secondary | ICD-10-CM

## 2021-03-12 DIAGNOSIS — I10 Essential (primary) hypertension: Secondary | ICD-10-CM | POA: Diagnosis not present

## 2021-03-12 DIAGNOSIS — E669 Obesity, unspecified: Secondary | ICD-10-CM

## 2021-03-12 DIAGNOSIS — N400 Enlarged prostate without lower urinary tract symptoms: Secondary | ICD-10-CM | POA: Insufficient documentation

## 2021-03-12 DIAGNOSIS — N401 Enlarged prostate with lower urinary tract symptoms: Secondary | ICD-10-CM | POA: Diagnosis not present

## 2021-03-12 DIAGNOSIS — R351 Nocturia: Secondary | ICD-10-CM

## 2021-03-12 LAB — BASIC METABOLIC PANEL WITH GFR
BUN/Creatinine Ratio: 15 (calc) (ref 6–22)
BUN: 19 mg/dL (ref 7–25)
CO2: 26 mmol/L (ref 20–32)
Calcium: 9.3 mg/dL (ref 8.6–10.3)
Chloride: 111 mmol/L — ABNORMAL HIGH (ref 98–110)
Creat: 1.3 mg/dL — ABNORMAL HIGH (ref 0.70–1.22)
Glucose, Bld: 96 mg/dL (ref 65–99)
Potassium: 3.7 mmol/L (ref 3.5–5.3)
Sodium: 143 mmol/L (ref 135–146)
eGFR: 55 mL/min/{1.73_m2} — ABNORMAL LOW (ref >=60)

## 2021-03-12 LAB — LIPID PANEL
Cholesterol: 136 mg/dL (ref <200)
HDL: 31 mg/dL — ABNORMAL LOW (ref >=40)
LDL Cholesterol (Calc): 75 mg/dL (calc)
Non-HDL Cholesterol (Calc): 105 mg/dL (calc) (ref <130)
Total CHOL/HDL Ratio: 4.4 (calc) (ref <5.0)
Triglycerides: 201 mg/dL — ABNORMAL HIGH (ref <150)

## 2021-03-12 LAB — HEMOGLOBIN A1C
Hgb A1c MFr Bld: 5.3 % of total Hgb (ref <5.7)
Mean Plasma Glucose: 105 mg/dL
eAG (mmol/L): 5.8 mmol/L

## 2021-03-12 LAB — MAGNESIUM: Magnesium: 1.9 mg/dL (ref 1.5–2.5)

## 2021-03-12 NOTE — Progress Notes (Signed)
Mg, A1C are within normal, not diabetic. Triglycerides 201, up from 161,  3 months ago. First line of treatment is lifestyle modification. Weight loss and reduction of intake of simple carbohydrates especially high- glycemic and high fructose foods and beverages. I suggest to increase consumption of fish that contain high amount of omega-2 fatty acids. A decrease in alcohol consumption is also recommended. A repeat in lipid panel can be done in 3 months after the lifestyle modifications. Kidney function is almost same as 3 months ago.

## 2021-03-12 NOTE — Progress Notes (Signed)
Provider:  Jacalyn Lefevre, MD  Careteam: Patient Care Team: Frederica Kuster, MD as PCP - General (Family Medicine)  PLACE OF SERVICE:  [facility]  Advanced Directive information    Allergies  Allergen Reactions   Losartan Other (See Comments)    Chief Complaint  Patient presents with   Acute Visit    Patient presents today to discuss labwork that was done on 03/11/21.     HPI: Patient is a 85 y.o. male .  Jeffery [name] is here today to discuss lab work that he had done yesterday.  Basically his lab work is normal including A1c lipids and BMP. Still has lots of chronic pain that he is seeing pain management for that.  They have tried some injections in his neck.  Spends much of the day in recliner with heating pad sleeping and as long as he is in this position does not have pain or the chronic dizziness which also plagues him.  Review of Systems:  Review of Systems  Constitutional:  Positive for malaise/fatigue.  Respiratory: Negative.    Cardiovascular: Negative.   Neurological:  Positive for dizziness and headaches.  All other systems reviewed and are negative.  Past Medical History:  Diagnosis Date   Anxiety    BPH (benign prostatic hyperplasia)    Coronary artery disease    Depression    Dizziness    Dyspnea    with exertion    Fecal incontinence    GERD (gastroesophageal reflux disease)    Glaucoma    Hard of hearing    Headache    History of pneumonia    Hyperlipidemia    Hypertension    Memory impairment    Migraine headache    Neck fracture ([facility])    2-3/2-21 followed by DR Sandi Carne in [place]    Nocturia    Osteoarthritis    Restrictive lung disease    Sleep apnea    wears CPAP does not know setting    Thyroid disease    Hyperparathyroidism    Urinary frequency    Urinary incontinence    Wears glasses    Past Surgical History:  Procedure Laterality Date   CARDIAC CATHETERIZATION     CIRCUMCISION     COLONOSCOPY     CORONARY  ANGIOPLASTY     stents- 08/2019    ESOPHAGOGASTRODUODENOSCOPY     EYE SURGERY Bilateral    cataract   HAND SURGERY Right    HIP ARTHROPLASTY Right    HIP ARTHROPLASTY Left 2006   KNEE CARTILAGE SURGERY Bilateral    LUMBAR LAMINECTOMY     L3 - L4   RETINAL DETACHMENT SURGERY Right    x2   REVERSE SHOULDER ARTHROPLASTY Right 05/29/2016   Procedure: REVERSE SHOULDER ARTHROPLASTY;  Surgeon: Francena Hanly, MD;  Location: [facility];  Service: Orthopedics;  Laterality: Right;   SHOULDER SURGERY Bilateral    TOTAL HIP REVISION Right 04/04/2020   Procedure: Right hip acetabular liner revision;  Surgeon: [name], MD;  Location: [facility];  Service: Orthopedics;  Laterality: Right;    Social History:   reports that he has quit smoking. He has never used smokeless tobacco. He reports current alcohol use. He reports that he does not use drugs.  History reviewed. No pertinent family history.  Medications: Patient's Medications  New Prescriptions   No medications on file  Previous Medications   AMLODIPINE (NORVASC) 10 MG TABLET    Take 1 tablet (10 mg total) by  mouth daily.   ASPIRIN 81 MG TABLET    Take 81 mg by mouth in the morning and at bedtime.   ATORVASTATIN (LIPITOR) 40 MG TABLET    Take 1 tablet (40 mg total) by mouth every evening.   BUSPIRONE (BUSPAR) 10 MG TABLET    Take 1 tablet (10 mg total) by mouth 2 (two) times daily.   CALCIUM CARBONATE ANTACID (TUMS PO)    Take by mouth.   CHOLECALCIFEROL (VITAMIN D) 2000 UNITS CAPS    Take 2,000 Units by mouth daily.   COLESTIPOL (COLESTID) 1 G TABLET    Take 1 tablet (1 g total) by mouth 2 (two) times daily.   FUROSEMIDE (LASIX) 20 MG TABLET    Take 20 mg by mouth daily.    GABAPENTIN (NEURONTIN) 600 MG TABLET    Take 600 mg by mouth 3 (three) times daily.   LATANOPROST (XALATAN) 0.005 % OPHTHALMIC SOLUTION    Place 1 drop into both eyes at bedtime.   LOSARTAN (COZAAR) 25 MG TABLET    Take 1 tablet (25 mg total) by mouth daily.    MAGNESIUM OXIDE (MAG-OX) 400 MG TABLET    Take 400 mg by mouth daily.   MECLIZINE (ANTIVERT) 25 MG TABLET    Take 1 tablet (25 mg total) by mouth 2 (two) times daily.   MECLIZINE (ANTIVERT) 25 MG TABLET    Take 1 tablet (25 mg total) by mouth daily as needed for dizziness.   OMEGA-3 FATTY ACIDS (FISH OIL) 500 MG CAPS    Take 1 capsule by mouth daily.   OMEPRAZOLE (PRILOSEC) 20 MG CAPSULE    Take 1 capsule (20 mg total) by mouth daily as needed (acid reflux).   POTASSIUM CHLORIDE SA (KLOR-CON) 20 MEQ TABLET    Take 1 tablet (20 mEq total) by mouth daily.   PRASUGREL (EFFIENT) 10 MG TABS TABLET    Take 10 mg by mouth daily.   PROBIOTIC PRODUCT (PROBIOTIC PO)    Take 1 capsule by mouth daily.   TERAZOSIN (HYTRIN) 5 MG CAPSULE    Take 1 capsule (5 mg total) by mouth every evening.   TIMOLOL (TIMOPTIC) 0.5 % OPHTHALMIC SOLUTION    Place 1 drop into both eyes 2 (two) times daily.   VITAMIN B-12 (CYANOCOBALAMIN) 1000 MCG TABLET    Take 1,000 mcg by mouth daily.  Modified Medications   No medications on file  Discontinued Medications   No medications on file    Physical Exam:  There were no vitals filed for this visit. There is no height or weight on file to calculate BMI. Wt Readings from Last 3 Encounters:  03/08/21 294 lb 3.2 oz (133.4 kg)  02/21/21 256 lb 3.2 oz (116.2 kg)  01/22/21 284 lb (128.8 kg)    Physical Exam Vitals and nursing note reviewed.  Constitutional:      Appearance: Normal appearance. He is obese.  Cardiovascular:     Rate and Rhythm: Normal rate and regular rhythm.  Pulmonary:     Effort: Pulmonary effort is normal.     Breath sounds: Normal breath sounds.  Neurological:     Mental Status: He is alert.    Labs reviewed: Basic Metabolic Panel: Recent Labs    07/24/20 0000 12/10/20 1113 03/11/21 1032  NA 141 143 143  K 3.9 4.2 3.7  CL 106 110 111*  CO2 25 24 26   GLUCOSE 75 98 96  BUN 18 21 19   CREATININE 1.05 1.27* 1.30*  CALCIUM 9.3 9.2 9.3  MG  --    --  1.9  TSH  --  2.25  --    Liver Function Tests: Recent Labs    03/26/20 1441 07/24/20 0000 12/10/20 1113  AST 15 23 17   ALT 12 20 15   ALKPHOS 69  --   --   BILITOT 0.7 0.5 0.6  PROT 6.7 6.7 6.2  ALBUMIN 3.8  --   --    No results for input(s): LIPASE, AMYLASE in the last 8760 hours. No results for input(s): AMMONIA in the last 8760 hours. CBC: Recent Labs    04/05/20 0549 07/24/20 0000 12/10/20 1113  WBC 11.1* 7.0 5.7  NEUTROABS  --  4,620 3,893  HGB 14.1 15.1 13.5  HCT 43.0 44.2 40.8  MCV 92.1 90.8 91.5  PLT 194 177 142   Lipid Panel: Recent Labs    07/24/20 0000 12/10/20 1113 03/11/21 1032  CHOL 158 132 136  HDL 35* 31* 31*  LDLCALC 89 75 75  TRIG 260* 161* 201*  CHOLHDL 4.5 4.3 4.4   TSH: Recent Labs    12/10/20 1113  TSH 2.25   A1C: Lab Results  Component Value Date   HGBA1C 5.3 03/11/2021     Assessment/Plan 1. Dizziness Chronic condition probably multifactorial.  Takes meclizine but no definite etiology has ever been found  2. Primary hypertension Blood pressure good at 130/70  3. Benign prostatic hyperplasia with nocturia PSA continues to be elevated at 16+.  Had formally been 35.  Had negative biopsies but was told by urologist he probably has some prostate cancer.  We will follow  4. Obesity (BMI 30-39.9) Patient is working on weight reduction but he enjoys eating and spends most of his time in a recliner not moving    12/12/20, MD [facility] [phone]

## 2021-03-24 ENCOUNTER — Other Ambulatory Visit: Payer: Self-pay | Admitting: Family

## 2021-03-25 NOTE — Telephone Encounter (Signed)
High risk or very high risk warning populated when attempting to refill medication. RX request sent to PCP for review and approval if warranted.   

## 2021-03-25 NOTE — Telephone Encounter (Signed)
I called patient and he stated he is taking medication and not sure why its on his allergy list. Patients wife was also on the line and stated patient seen Dr.Miller this month and she would like to know why Dr.Miller did not see this and address at the time of visit.   Medication was added to allergy list on 11/10/2020 at an emergency room visit. I was unable to determine why it was added.   Please advise if this should wait for Dr.Miller to review tomorrow or if you are able to determine why it was added to list on 11/10/2020

## 2021-03-25 NOTE — Telephone Encounter (Signed)
High risk because he has allergy reported to this medicine, should we be refilling? If he is currently taking then needs to be removed from allergy list.

## 2021-03-29 ENCOUNTER — Telehealth: Payer: Self-pay | Admitting: *Deleted

## 2021-03-29 NOTE — Telephone Encounter (Signed)
Patient wife, Jeffery [name], called and stated that patient has Losartan in his current medication list but he is NOT taking the medication.   Stated that the Losartan is listed as a Allergy and he cannot take it.   Medication list updated.

## 2021-04-21 ENCOUNTER — Other Ambulatory Visit: Payer: Self-pay | Admitting: Family

## 2021-04-22 ENCOUNTER — Telehealth: Payer: Self-pay | Admitting: *Deleted

## 2021-04-22 NOTE — Telephone Encounter (Signed)
Wife, RoseMarie called and stated that patient needs a refill on his:   Tizanidine 4mg  Take one tablet by mouth twice daily.   Medication is NOT in current medication list. Patient is still taking it. Would like a refill.   Is it ok to add this back to his medication list and send to [facility].   Please Advise.

## 2021-04-23 MED ORDER — TIZANIDINE HCL 4 MG PO CAPS: 4.0000 mg | ORAL_CAPSULE | Freq: Two times a day (BID) | ORAL | 1 refills | Status: DC

## 2021-04-23 NOTE — Telephone Encounter (Signed)
Per Dr. Lisabeth Register to add medication and send to pharmacy.   Medication list updated and Rx sent to pharmacy.

## 2021-05-05 ENCOUNTER — Other Ambulatory Visit: Payer: Self-pay | Admitting: Family

## 2021-05-09 ENCOUNTER — Other Ambulatory Visit: Payer: Self-pay | Admitting: *Deleted

## 2021-05-09 MED ORDER — OMEPRAZOLE 20 MG PO CPDR: DELAYED_RELEASE_CAPSULE | ORAL | 1 refills | Status: DC

## 2021-05-09 MED ORDER — OMEPRAZOLE 20 MG PO CPDR: DELAYED_RELEASE_CAPSULE | ORAL | 0 refills | Status: DC

## 2021-05-09 NOTE — Telephone Encounter (Signed)
Patient wife requested refill. Needed a local Rx sent to [facility] and Mail Order Rx to [facility].

## 2021-05-15 ENCOUNTER — Telehealth: Payer: Self-pay | Admitting: *Deleted

## 2021-05-15 ENCOUNTER — Ambulatory Visit (INDEPENDENT_AMBULATORY_CARE_PROVIDER_SITE_OTHER): Payer: Medicare HMO | Admitting: Family Medicine

## 2021-05-15 ENCOUNTER — Encounter: Payer: Self-pay | Admitting: Family Medicine

## 2021-05-15 ENCOUNTER — Other Ambulatory Visit: Payer: Self-pay

## 2021-05-15 VITALS — BP 146/92 | HR 70 | Temp 97.3°F

## 2021-05-15 DIAGNOSIS — Z96611 Presence of right artificial shoulder joint: Secondary | ICD-10-CM

## 2021-05-15 DIAGNOSIS — G8929 Other chronic pain: Secondary | ICD-10-CM

## 2021-05-15 DIAGNOSIS — I10 Essential (primary) hypertension: Secondary | ICD-10-CM | POA: Diagnosis not present

## 2021-05-15 DIAGNOSIS — M25512 Pain in left shoulder: Secondary | ICD-10-CM | POA: Diagnosis not present

## 2021-05-15 DIAGNOSIS — F331 Major depressive disorder, recurrent, moderate: Secondary | ICD-10-CM

## 2021-05-15 DIAGNOSIS — E669 Obesity, unspecified: Secondary | ICD-10-CM | POA: Diagnosis not present

## 2021-05-15 MED ORDER — CITALOPRAM HYDROBROMIDE 10 MG PO TABS: 10.0000 mg | ORAL_TABLET | Freq: Every day | ORAL | 3 refills | Status: DC

## 2021-05-15 MED ORDER — NITROGLYCERIN 0.1 MG/HR TD PT24: 0.2000 mg | MEDICATED_PATCH | Freq: Every day | TRANSDERMAL | Status: DC

## 2021-05-15 NOTE — Telephone Encounter (Signed)
Patient wife Called and stated that patient was seen today and prescribed Citalopram and Nitroglycerin Patch.   Stated that the pharmacy only received the Citalopram Rx.   Needs Rx for the Nitroglycerin patch sent to pharmacy. Pended Rx but needs the correct dosing on how to use.   Please Advise.     OV Note Dated 05/15/2021: 2. Chronic left shoulder pain Offered him referral to sports medicine but also told him about the possibility of nitroglycerin patch helping pain and he opted for that - nitroGLYCERIN (NITRODUR - Dosed in mg/24 hr) patch 0.2 mg

## 2021-05-15 NOTE — Progress Notes (Signed)
Provider:  Jacalyn Lefevre, MD  Careteam: Patient Care Team: Frederica Kuster, MD as PCP - General (Family Medicine)  PLACE OF SERVICE:  [facility] CLINIC  Advanced Directive information    No Known Allergies  Chief Complaint  Patient presents with   Medical Management of Chronic Issues    Patient presents today for increase in anxiety and depression.   Acute Visit    Patient reports left shoulder and arm pain.     HPI: Patient is a 85 y.o. male patient complains of depression.  He and his wife are under the impression that BuSpar would help but I explained that it is more for anxiety and a week anxiety agent at that.  He has issues with sleep as well as weight gain and apathy.  Also complains of chronic shoulder pain.  Had injection at orthopedic office recently that helped briefly.  He has had rotator cuff issues with both shoulders.  Plain film was normal by history.  Review of Systems:  Review of Systems  Constitutional:  Positive for malaise/fatigue.  Respiratory: Negative.    Cardiovascular: Negative.   Musculoskeletal:  Positive for joint pain and neck pain.  Psychiatric/Behavioral:  Positive for depression.   All other systems reviewed and are negative.  Past Medical History:  Diagnosis Date   Anxiety    BPH (benign prostatic hyperplasia)    Coronary artery disease    Depression    Dizziness    Dyspnea    with exertion    Fecal incontinence    GERD (gastroesophageal reflux disease)    Glaucoma    Hard of hearing    Headache    History of pneumonia    Hyperlipidemia    Hypertension    Memory impairment    Migraine headache    Neck fracture (HCC)    2-3/2-21 followed by DR Sandi Carne in [place]    Nocturia    Osteoarthritis    Restrictive lung disease    Sleep apnea    wears CPAP does not know setting    Thyroid disease    Hyperparathyroidism    Urinary frequency    Urinary incontinence    Wears glasses    Past Surgical History:  Procedure  Laterality Date   CARDIAC CATHETERIZATION     CIRCUMCISION     COLONOSCOPY     CORONARY ANGIOPLASTY     stents- 08/2019    ESOPHAGOGASTRODUODENOSCOPY     EYE SURGERY Bilateral    cataract   HAND SURGERY Right    HIP ARTHROPLASTY Right    HIP ARTHROPLASTY Left 2006   KNEE CARTILAGE SURGERY Bilateral    LUMBAR LAMINECTOMY     L3 - L4   RETINAL DETACHMENT SURGERY Right    x2   REVERSE SHOULDER ARTHROPLASTY Right 05/29/2016   Procedure: REVERSE SHOULDER ARTHROPLASTY;  Surgeon: Francena Hanly, MD;  Location: [facility] OR;  Service: Orthopedics;  Laterality: Right;   SHOULDER SURGERY Bilateral    TOTAL HIP REVISION Right 04/04/2020   Procedure: Right hip acetabular liner revision;  Surgeon: [name], MD;  Location: [facility] ORS;  Service: Orthopedics;  Laterality: Right;    Social History:   reports that he has quit smoking. He has never used smokeless tobacco. He reports current alcohol use. He reports that he does not use drugs.  No family history on file.  Medications: Patient's Medications  New Prescriptions   No medications on file  Previous Medications   AMLODIPINE (NORVASC) 10 MG  TABLET    TAKE 1 TABLET EVERY DAY   ASPIRIN 81 MG TABLET    Take 81 mg by mouth in the morning and at bedtime.   ATORVASTATIN (LIPITOR) 40 MG TABLET    Take 1 tablet (40 mg total) by mouth every evening.   BUSPIRONE (BUSPAR) 10 MG TABLET    Take 1 tablet (10 mg total) by mouth 2 (two) times daily.   CALCIUM CARBONATE ANTACID (TUMS PO)    Take by mouth.   CHOLECALCIFEROL (VITAMIN D) 2000 UNITS CAPS    Take 2,000 Units by mouth daily.   COLESTIPOL (COLESTID) 1 G TABLET    Take 1 tablet (1 g total) by mouth 2 (two) times daily.   DUTASTERIDE (AVODART) 0.5 MG CAPSULE    TAKE 1 CAPSULE EVERY DAY   FUROSEMIDE (LASIX) 20 MG TABLET    Take 20 mg by mouth daily.    LATANOPROST (XALATAN) 0.005 % OPHTHALMIC SOLUTION    Place 1 drop into both eyes at bedtime.   MAGNESIUM OXIDE (MAG-OX) 400 MG TABLET    Take 400 mg  by mouth daily.   MECLIZINE (ANTIVERT) 25 MG TABLET    Take 1 tablet (25 mg total) by mouth 2 (two) times daily.   MECLIZINE (ANTIVERT) 25 MG TABLET    Take 1 tablet (25 mg total) by mouth daily as needed for dizziness.   OMEGA-3 FATTY ACIDS (FISH OIL) 500 MG CAPS    Take 1 capsule by mouth daily.   OMEPRAZOLE (PRILOSEC) 20 MG CAPSULE    Take one capsule by mouth once daily as needed for acid reflux.   POTASSIUM CHLORIDE SA (KLOR-CON) 20 MEQ TABLET    Take 1 tablet (20 mEq total) by mouth daily.   PRASUGREL (EFFIENT) 10 MG TABS TABLET    Take 10 mg by mouth daily.   PREGABALIN (LYRICA) 100 MG CAPSULE    Take by mouth.   PROBIOTIC PRODUCT (PROBIOTIC PO)    Take 1 capsule by mouth daily.   TERAZOSIN (HYTRIN) 5 MG CAPSULE    Take 1 capsule (5 mg total) by mouth every evening.   TIMOLOL (TIMOPTIC) 0.5 % OPHTHALMIC SOLUTION    Place 1 drop into both eyes 2 (two) times daily.   TIZANIDINE (ZANAFLEX) 4 MG CAPSULE    Take 1 capsule (4 mg total) by mouth 2 (two) times daily.   VITAMIN B-12 (CYANOCOBALAMIN) 1000 MCG TABLET    Take 1,000 mcg by mouth daily.  Modified Medications   No medications on file  Discontinued Medications   GABAPENTIN (NEURONTIN) 600 MG TABLET    Take 600 mg by mouth 3 (three) times daily.    Physical Exam:  Vitals:   05/15/21 1108  BP: (!) 146/92  Pulse: 70  Temp: (!) 97.3 F (36.3 C)  SpO2: 96%   There is no height or weight on file to calculate BMI. Wt Readings from Last 3 Encounters:  03/12/21 292 lb (132.5 kg)  03/08/21 294 lb 3.2 oz (133.4 kg)  02/21/21 256 lb 3.2 oz (116.2 kg)    Physical Exam Vitals and nursing note reviewed.  Constitutional:      Appearance: He is obese.  Cardiovascular:     Rate and Rhythm: Normal rate.  Pulmonary:     Effort: Pulmonary effort is normal.  Musculoskeletal:     Comments: Left shoulder.  There is decreased range of motion and pain with elevation and internal rotation.  I cannot detect torn rotator cuff and pain seems  to be more posterior  Neurological:     General: No focal deficit present.     Mental Status: He is alert and oriented to person, place, and time.  Psychiatric:     Comments: Patient did poorly on depression scale.  Scored 25.    Labs reviewed: Basic Metabolic Panel: Recent Labs    07/24/20 0000 12/10/20 1113 03/11/21 1032  NA 141 143 143  K 3.9 4.2 3.7  CL 106 110 111*  CO2 25 24 26   GLUCOSE 75 98 96  BUN 18 21 19   CREATININE 1.05 1.27* 1.30*  CALCIUM 9.3 9.2 9.3  MG  --   --  1.9  TSH  --  2.25  --    Liver Function Tests: Recent Labs    07/24/20 0000 12/10/20 1113  AST 23 17  ALT 20 15  BILITOT 0.5 0.6  PROT 6.7 6.2   No results for input(s): LIPASE, AMYLASE in the last 8760 hours. No results for input(s): AMMONIA in the last 8760 hours. CBC: Recent Labs    07/24/20 0000 12/10/20 1113  WBC 7.0 5.7  NEUTROABS 4,620 3,893  HGB 15.1 13.5  HCT 44.2 40.8  MCV 90.8 91.5  PLT 177 142   Lipid Panel: Recent Labs    07/24/20 0000 12/10/20 1113 03/11/21 1032  CHOL 158 132 136  HDL 35* 31* 31*  LDLCALC 89 75 75  TRIG 260* 161* 201*  CHOLHDL 4.5 4.3 4.4   TSH: Recent Labs    12/10/20 1113  TSH 2.25   A1C: Lab Results  Component Value Date   HGBA1C 5.3 03/11/2021     Assessment/Plan  1. Moderate episode of recurrent major depressive disorder (HCC) Begin with low-dose and recheck in 1 month if improved but not at goal will increase to 20 mg - citalopram (CELEXA) 10 MG tablet; Take 1 tablet (10 mg total) by mouth daily.  Dispense: 30 tablet; Refill: 3  2. Chronic left shoulder pain Offered him referral to sports medicine but also told him about the possibility of nitroglycerin patch helping pain and he opted for that - nitroGLYCERIN (NITRODUR - Dosed in mg/24 hr) patch 0.2 mg  3. Obesity (BMI 30-39.9) I think some of the obesity is tied into his depression and if we are successful in treating depression he might be able to lose a few  pounds  4. Primary hypertension Blood pressure is not optimal but continue with amlodipine     12/12/20, MD [facility] [phone]

## 2021-05-20 ENCOUNTER — Other Ambulatory Visit: Payer: Self-pay | Admitting: Family Medicine

## 2021-05-20 DIAGNOSIS — G8929 Other chronic pain: Secondary | ICD-10-CM

## 2021-05-20 MED ORDER — NITROGLYCERIN 0.2 MG/HR TD PT24: 0.2000 mg | MEDICATED_PATCH | Freq: Every day | TRANSDERMAL | 12 refills | Status: DC

## 2021-05-20 NOTE — Telephone Encounter (Signed)
Jeffery Kuster, MD  You 44 minutes ago (11:43 AM)   I have sent this Rx twice but doing again

## 2021-05-20 NOTE — Telephone Encounter (Signed)
Patient wife called and stated that they still haven't received the patch.   Pended and sent to Dr. Hyacinth Meeker for instructions of use.  Please Advise.

## 2021-05-22 ENCOUNTER — Other Ambulatory Visit: Payer: Self-pay | Admitting: Family

## 2021-05-27 ENCOUNTER — Other Ambulatory Visit: Payer: Self-pay

## 2021-05-27 MED ORDER — BUSPIRONE HCL 10 MG PO TABS: 10.0000 mg | ORAL_TABLET | Freq: Two times a day (BID) | ORAL | 1 refills | Status: DC

## 2021-05-31 ENCOUNTER — Telehealth: Payer: Self-pay | Admitting: *Deleted

## 2021-05-31 NOTE — Telephone Encounter (Signed)
Patient wife notified. She stated to leave a message on Voicemail if they did not answer.  Left Detailed message.

## 2021-05-31 NOTE — Telephone Encounter (Signed)
Yes okay to use melatonin 1-6 mg daily at bedtime

## 2021-05-31 NOTE — Telephone Encounter (Signed)
Patient wife, RoseMarie called and stated that patient is having a hard time sleeping at night and wonders if it would be ok to give patient Melatonin to help rest.   Please Advise.  (Sent to [place] due to Dr. Hyacinth Meeker out of office)

## 2021-06-12 ENCOUNTER — Ambulatory Visit: Payer: Medicare HMO | Admitting: Family Medicine

## 2021-06-16 ENCOUNTER — Other Ambulatory Visit: Payer: Self-pay | Admitting: Family

## 2021-07-10 ENCOUNTER — Other Ambulatory Visit: Payer: Self-pay | Admitting: Family

## 2021-07-24 ENCOUNTER — Other Ambulatory Visit: Payer: Self-pay | Admitting: Family Medicine

## 2021-07-24 DIAGNOSIS — F331 Major depressive disorder, recurrent, moderate: Secondary | ICD-10-CM

## 2021-07-25 ENCOUNTER — Ambulatory Visit: Payer: Medicare HMO | Admitting: Family

## 2021-08-12 ENCOUNTER — Other Ambulatory Visit: Payer: Self-pay | Admitting: *Deleted

## 2021-08-12 NOTE — Telephone Encounter (Signed)
Patient wife called and stated that patient has been taking Mometasone twist inhaler 2 puffs daily at bedtime but it is prescribed by his previous Dr.   Dewayne Hatch a Rx called to pharmacy.  Pended Rx and sent to Dr. Hyacinth Meeker for approval.

## 2021-08-13 MED ORDER — MOMETASONE FUROATE 220 MCG/ACT IN AEPB: 2.0000 | INHALATION_SPRAY | Freq: Every day | RESPIRATORY_TRACT | 1 refills | Status: DC

## 2021-08-14 ENCOUNTER — Telehealth: Payer: Self-pay

## 2021-08-14 MED ORDER — FLUTICASONE FUROATE 200 MCG/ACT IN AEPB: 1.0000 | INHALATION_SPRAY | Freq: Every day | RESPIRATORY_TRACT | 5 refills | Status: DC

## 2021-08-14 NOTE — Telephone Encounter (Signed)
° °  The above was received via fax. Call placed to patient/spouse to inquire if patient would be ok with an alternative and his wife stated he is willing to take what ever is covered.  Dr.Miller please review the above and send a covered alternative for Asmanex to patients pharmacy on file [facility]) if you are in agreement with patients decision to change therapy

## 2021-08-14 NOTE — Telephone Encounter (Signed)
Below is Dr.Miller's response  Frederica Kuster, MD  You 11 minutes ago (12:29 PM)   Fluticasone 200 mcg 1 puff daily   RX sent

## 2021-08-14 NOTE — Telephone Encounter (Signed)
Below is Dr.Miller's reply:  Jeffery Kuster, MD  You 2 hours ago (9:51 AM)   These alternatives to Asmanex are satisfactory    Dr.Miller as the clinician you would have select one of the alternatives and specify dose, instructions, and dispense amt. As a Engineer, site I can not select an alternative medication for the patient. If you will provide all the required information I will add the alternative to the medication list, send rx, and remove asmanex.

## 2021-08-19 ENCOUNTER — Other Ambulatory Visit: Payer: Medicare HMO

## 2021-08-19 DIAGNOSIS — I10 Essential (primary) hypertension: Secondary | ICD-10-CM

## 2021-08-19 DIAGNOSIS — R42 Dizziness and giddiness: Secondary | ICD-10-CM

## 2021-08-19 DIAGNOSIS — E785 Hyperlipidemia, unspecified: Secondary | ICD-10-CM

## 2021-08-20 ENCOUNTER — Encounter: Payer: Self-pay | Admitting: Family Medicine

## 2021-08-20 ENCOUNTER — Other Ambulatory Visit: Payer: Self-pay

## 2021-08-20 ENCOUNTER — Ambulatory Visit (INDEPENDENT_AMBULATORY_CARE_PROVIDER_SITE_OTHER): Payer: Medicare HMO | Admitting: Family Medicine

## 2021-08-20 VITALS — BP 102/60 | HR 63 | Temp 97.1°F | Ht 70.0 in | Wt 282.4 lb

## 2021-08-20 DIAGNOSIS — R269 Unspecified abnormalities of gait and mobility: Secondary | ICD-10-CM

## 2021-08-20 DIAGNOSIS — G8929 Other chronic pain: Secondary | ICD-10-CM

## 2021-08-20 DIAGNOSIS — R42 Dizziness and giddiness: Secondary | ICD-10-CM | POA: Diagnosis not present

## 2021-08-20 DIAGNOSIS — M25512 Pain in left shoulder: Secondary | ICD-10-CM

## 2021-08-20 DIAGNOSIS — F331 Major depressive disorder, recurrent, moderate: Secondary | ICD-10-CM

## 2021-08-20 NOTE — Progress Notes (Signed)
Routine   Provider:  Jacalyn Lefevre, MD  Careteam: Patient Care Team: Frederica Kuster, MD as PCP - General (Family Medicine)  PLACE OF SERVICE:  [facility] CLINIC  Advanced Directive information Does Patient Have a Medical Advance Directive?: Yes, Type of Advance Directive: Healthcare Power of Page;Living will, Does patient want to make changes to medical advance directive?: No - Patient declined  No Known Allergies  Chief Complaint  Patient presents with   Medical Management of Chronic Issues    Patient presents today for a 3 month follow-up    Quality Metric Gaps    Flu, COVID booster     HPI: Patient is a 86 y.o. male routine follow-up.  Accompanied by wife.  Patient continues to sit most of the day and sleep and does not really do much else.  He is scheduled for possible occipital nerve ablation in March.  Continues to have daily headaches that sound like they may be stress related.  About 1 month ago started Lexapro per his mental health provider.  Has also been seen by cardiology who added spironolactone to his furosemide.  He does complain of frequent urination. We discussed possibility of adding a home health consult for physical therapy to try to get him up and moving.  I thought some of his inactivity might be related to apathy with cognitive problems but he easily recalls what he ate yesterday as well as 3 words at 5 minutes.  Review of Systems:  Review of Systems  Constitutional: Negative.   HENT: Negative.    Respiratory:  Positive for shortness of breath.   Cardiovascular: Negative.   Genitourinary:  Positive for frequency and urgency.  Skin: Negative.   Neurological:  Positive for dizziness.  Psychiatric/Behavioral:  Positive for depression.   All other systems reviewed and are negative.  Past Medical History:  Diagnosis Date   Anxiety    BPH (benign prostatic hyperplasia)    Coronary artery disease    Depression    Dizziness    Dyspnea    with exertion     Fecal incontinence    GERD (gastroesophageal reflux disease)    Glaucoma    Hard of hearing    Headache    History of pneumonia    Hyperlipidemia    Hypertension    Memory impairment    Migraine headache    Neck fracture (HCC)    2-3/2-21 followed by DR Sandi Carne in [place]    Nocturia    Osteoarthritis    Restrictive lung disease    Sleep apnea    wears CPAP does not know setting    Thyroid disease    Hyperparathyroidism    Urinary frequency    Urinary incontinence    Wears glasses    Past Surgical History:  Procedure Laterality Date   CARDIAC CATHETERIZATION     CIRCUMCISION     COLONOSCOPY     CORONARY ANGIOPLASTY     stents- 08/2019    ESOPHAGOGASTRODUODENOSCOPY     EYE SURGERY Bilateral    cataract   HAND SURGERY Right    HIP ARTHROPLASTY Right    HIP ARTHROPLASTY Left 2006   KNEE CARTILAGE SURGERY Bilateral    LUMBAR LAMINECTOMY     L3 - L4   RETINAL DETACHMENT SURGERY Right    x2   REVERSE SHOULDER ARTHROPLASTY Right 05/29/2016   Procedure: REVERSE SHOULDER ARTHROPLASTY;  Surgeon: Francena Hanly, MD;  Location: [facility] OR;  Service: Orthopedics;  Laterality: Right;   SHOULDER  SURGERY Bilateral    TOTAL HIP REVISION Right 04/04/2020   Procedure: Right hip acetabular liner revision;  Surgeon: [name], MD;  Location: [facility] ORS;  Service: Orthopedics;  Laterality: Right;    Social History:   reports that he has quit smoking. He has never used smokeless tobacco. He reports current alcohol use. He reports that he does not use drugs.  History reviewed. No pertinent family history.  Medications: Patient's Medications  New Prescriptions   No medications on file  Previous Medications   AMLODIPINE (NORVASC) 10 MG TABLET    TAKE 1 TABLET EVERY DAY   ASPIRIN 81 MG TABLET    Take 81 mg by mouth in the morning and at bedtime.   ATORVASTATIN (LIPITOR) 40 MG TABLET    TAKE 1 TABLET EVERY EVENING.   BUSPIRONE (BUSPAR) 10 MG TABLET    Take 1 tablet (10 mg total)  by mouth 2 (two) times daily.   CALCIUM CARBONATE ANTACID (TUMS PO)    Take by mouth.   CHOLECALCIFEROL (VITAMIN D) 2000 UNITS CAPS    Take 2,000 Units by mouth daily.   CITALOPRAM (CELEXA) 10 MG TABLET    Take 1 tablet (10 mg total) by mouth daily.   COLESTIPOL (COLESTID) 1 G TABLET    Take 1 tablet (1 g total) by mouth 2 (two) times daily.   DUTASTERIDE (AVODART) 0.5 MG CAPSULE    TAKE 1 CAPSULE EVERY DAY   FLUTICASONE FUROATE 200 MCG/ACT AEPB    Inhale 1 puff into the lungs daily at 12 noon.   FUROSEMIDE (LASIX) 20 MG TABLET    Take 20 mg by mouth daily.    LATANOPROST (XALATAN) 0.005 % OPHTHALMIC SOLUTION    Place 1 drop into both eyes at bedtime.   MAGNESIUM OXIDE (MAG-OX) 400 MG TABLET    Take 400 mg by mouth daily.   MECLIZINE (ANTIVERT) 25 MG TABLET    Take 1 tablet (25 mg total) by mouth 2 (two) times daily.   MECLIZINE (ANTIVERT) 25 MG TABLET    Take 1 tablet (25 mg total) by mouth daily as needed for dizziness.   NITROGLYCERIN (MINITRAN) 0.2 MG/HR PATCH    Place 1 patch (0.2 mg total) onto the skin daily. Use I/4 patch q 24 hrs   NITROGLYCERIN (NITRODUR - DOSED IN MG/24 HR) 0.2 MG/HR PATCH    Place 0.2 mg onto the skin daily.   OMEGA-3 FATTY ACIDS (FISH OIL) 500 MG CAPS    Take 1 capsule by mouth daily.   OMEPRAZOLE (PRILOSEC) 20 MG CAPSULE    Take one capsule by mouth once daily as needed for acid reflux.   POTASSIUM CHLORIDE SA (KLOR-CON M) 20 MEQ TABLET    TAKE 1 TABLET EVERY DAY   PRASUGREL (EFFIENT) 10 MG TABS TABLET    Take 10 mg by mouth daily.   PREGABALIN (LYRICA) 100 MG CAPSULE    Take by mouth.   PROBIOTIC PRODUCT (PROBIOTIC PO)    Take 1 capsule by mouth daily.   TERAZOSIN (HYTRIN) 5 MG CAPSULE    Take 1 capsule (5 mg total) by mouth every evening.   TIMOLOL (TIMOPTIC) 0.5 % OPHTHALMIC SOLUTION    Place 1 drop into both eyes 2 (two) times daily.   TIZANIDINE (ZANAFLEX) 4 MG CAPSULE    Take 1 capsule (4 mg total) by mouth 2 (two) times daily.   VITAMIN B-12  (CYANOCOBALAMIN) 1000 MCG TABLET    Take 1,000 mcg by mouth daily.  Modified Medications   No medications on file  Discontinued Medications   No medications on file    Physical Exam:  There were no vitals filed for this visit. There is no height or weight on file to calculate BMI. Wt Readings from Last 3 Encounters:  03/12/21 292 lb (132.5 kg)  03/08/21 294 lb 3.2 oz (133.4 kg)  02/21/21 256 lb 3.2 oz (116.2 kg)    Physical Exam Vitals and nursing note reviewed.  Constitutional:      Appearance: He is obese.  Cardiovascular:     Rate and Rhythm: Normal rate and regular rhythm.  Pulmonary:     Effort: Pulmonary effort is normal.     Breath sounds: Normal breath sounds.  Neurological:     General: No focal deficit present.     Mental Status: He is alert and oriented to person, place, and time.  Psychiatric:        Mood and Affect: Mood normal.        Behavior: Behavior normal.        Thought Content: Thought content normal.        Judgment: Judgment normal.    Labs reviewed: Basic Metabolic Panel: Recent Labs    12/10/20 1113 03/11/21 1032  NA 143 143  K 4.2 3.7  CL 110 111*  CO2 24 26  GLUCOSE 98 96  BUN 21 19  CREATININE 1.27* 1.30*  CALCIUM 9.2 9.3  MG  --  1.9  TSH 2.25  --    Liver Function Tests: Recent Labs    12/10/20 1113  AST 17  ALT 15  BILITOT 0.6  PROT 6.2   No results for input(s): LIPASE, AMYLASE in the last 8760 hours. No results for input(s): AMMONIA in the last 8760 hours. CBC: Recent Labs    12/10/20 1113  WBC 5.7  NEUTROABS 3,893  HGB 13.5  HCT 40.8  MCV 91.5  PLT 142   Lipid Panel: Recent Labs    12/10/20 1113 03/11/21 1032  CHOL 132 136  HDL 31* 31*  LDLCALC 75 75  TRIG 161* 201*  CHOLHDL 4.3 4.4   TSH: Recent Labs    12/10/20 1113  TSH 2.25   A1C: Lab Results  Component Value Date   HGBA1C 5.3 03/11/2021     Assessment/Plan 1. Chronic left shoulder pain Tried nitroglycerin patch to no avail  2.  Dizziness No true vertigo but lightheaded.  I really think part of this has to do with deconditioning  3. Morbid obesity (HCC) Weight is down by 10 pounds compared to last visit.  Appetite intermittent  4. Abnormal gait Uses a walker.  No recent falls    5. Moderate episode of recurrent major depressive disorder [facility]) Mental health worker had started Lexapro about 1 month ago 10 mg.  I suggested go ahead and increase to 20 mg.  Both patient and wife are interested in stopping some medicine so we spent time going through his list trying to stop what I feel are nonessential.  We did stop the statin in hopes that that might help some of his discomfort but plan to repeat lipid levels at his next visit in 3 months to see if this was a good idea   Jacalyn Lefevre, MD [facility] & Adult Medicine [phone]

## 2021-08-21 ENCOUNTER — Telehealth: Payer: Self-pay | Admitting: *Deleted

## 2021-08-21 NOTE — Telephone Encounter (Signed)
Patient wife, Jeffery [name] called and stated that patient was seen yesterday and it was discussed patient coming off of Avodart and Starting Flomax but she stated that there were no Rx sent to the pharmacy. Wife is wanting Rx to be sent to pharmacy, [facility].   Reviewed last OV note and Current medication list but did not see this.   Please Advise.

## 2021-08-22 ENCOUNTER — Telehealth: Payer: Self-pay

## 2021-08-22 ENCOUNTER — Ambulatory Visit: Payer: Medicare HMO | Admitting: Family

## 2021-08-22 MED ORDER — TAMSULOSIN HCL 0.4 MG PO CAPS: 0.4000 mg | ORAL_CAPSULE | Freq: Every day | ORAL | 0 refills | Status: DC

## 2021-08-22 NOTE — Telephone Encounter (Signed)
Patient's wife came in office today,08/22/21 to see if patient suppose to be on Flomax. I spoke with Dr.Miller and he states to started patient on Flomax 0.4 mg daily with a meal. Medication was send into pharmacy and wife was advised.

## 2021-08-22 NOTE — Telephone Encounter (Signed)
Jeffery Kuster, MD  You 15 minutes ago (4:28 PM)   I talked to [place] and we ordered

## 2021-08-28 ENCOUNTER — Telehealth: Payer: Self-pay

## 2021-08-28 NOTE — Telephone Encounter (Signed)
Message left on clinical intake voicemail:  ? ?Shelia with [facility] called to request verbal orders for PT  ? ?2 times weekly for 8 weeks then ?1 time weekly x 1 week ? ?I called and left a detailed message for [facility] authorizing verbal orders as requested ? ?Side note: Per [facility] standing order to give verbal orders. Message will be sent to patient's provider as a FYI.  ? ?

## 2021-09-24 ENCOUNTER — Encounter: Payer: Self-pay | Admitting: Family Medicine

## 2021-09-24 ENCOUNTER — Telehealth: Payer: Self-pay | Admitting: *Deleted

## 2021-09-24 ENCOUNTER — Ambulatory Visit (INDEPENDENT_AMBULATORY_CARE_PROVIDER_SITE_OTHER): Payer: Medicare HMO | Admitting: Family Medicine

## 2021-09-24 ENCOUNTER — Other Ambulatory Visit: Payer: Self-pay

## 2021-09-24 VITALS — BP 160/90 | HR 72 | Temp 96.8°F | Ht 70.0 in | Wt 276.4 lb

## 2021-09-24 DIAGNOSIS — F411 Generalized anxiety disorder: Secondary | ICD-10-CM

## 2021-09-24 DIAGNOSIS — M25512 Pain in left shoulder: Secondary | ICD-10-CM | POA: Diagnosis not present

## 2021-09-24 DIAGNOSIS — G4452 New daily persistent headache (NDPH): Secondary | ICD-10-CM

## 2021-09-24 DIAGNOSIS — G8929 Other chronic pain: Secondary | ICD-10-CM

## 2021-09-24 MED ORDER — CYCLOBENZAPRINE HCL 10 MG PO TABS: 10.0000 mg | ORAL_TABLET | Freq: Three times a day (TID) | ORAL | 0 refills | Status: DC | PRN

## 2021-09-24 MED ORDER — BUTALBITAL-APAP-CAFFEINE 50-325-40 MG PO TABS: ORAL_TABLET | ORAL | 0 refills | Status: DC

## 2021-09-24 MED ORDER — BUTALBITAL-APAP-CAFFEINE 50-325-40 MG PO TABS: 1.0000 | ORAL_TABLET | Freq: Four times a day (QID) | ORAL | 0 refills | Status: DC | PRN

## 2021-09-24 NOTE — Progress Notes (Signed)
? ? ?Provider:  ?Jacalyn Lefevre, MD ? ?Careteam: ?Patient Care Team: ?Frederica Kuster, MD as PCP - General (Family Medicine) ? ?PLACE OF SERVICE:  ?[facility]  ?Advanced Directive information ?  ? ?No Known Allergies ? ?Chief Complaint  ?Patient presents with  ? Acute Visit  ? ? ? ?HPI: Patient is a 86 y.o. male patient presents today with persistent headache.  Since his last visit he had some sort of procedure on occipital nerve but that did not help his headache.  It is always present.  He has no nausea or vomiting with it.  He has had multiple other attempts at treatment such as Accu puncture and and other injections around the hand by neurology. ?He is taking antidepressant now Lexapro 20 mg.  There is a certain amount of apathy in his daily activities.  He is seeing physical therapy to try to improve his ? ?Review of Systems:  ?Review of Systems  ?Constitutional:  Positive for malaise/fatigue.  ?Musculoskeletal:  Positive for joint pain and neck pain.  ?Neurological:  Positive for dizziness and headaches.  ?All other systems reviewed and are negative. ? ?Past Medical History:  ?Diagnosis Date  ? Anxiety   ? BPH (benign prostatic hyperplasia)   ? Coronary artery disease   ? Depression   ? Dizziness   ? Dyspnea   ? with exertion   ? Fecal incontinence   ? GERD (gastroesophageal reflux disease)   ? Glaucoma   ? Hard of hearing   ? Headache   ? History of pneumonia   ? Hyperlipidemia   ? Hypertension   ? Memory impairment   ? Migraine headache   ? Neck fracture (HCC)   ? 2-3/2-21 followed by DR Sandi Carne in [place]   ? Nocturia   ? Osteoarthritis   ? Restrictive lung disease   ? Sleep apnea   ? wears CPAP does not know setting   ? Thyroid disease   ? Hyperparathyroidism   ? Urinary frequency   ? Urinary incontinence   ? Wears glasses   ? ?Past Surgical History:  ?Procedure Laterality Date  ? CARDIAC CATHETERIZATION    ? CIRCUMCISION    ? COLONOSCOPY    ? CORONARY ANGIOPLASTY    ? stents- 08/2019   ?  ESOPHAGOGASTRODUODENOSCOPY    ? EYE SURGERY Bilateral   ? cataract  ? HAND SURGERY Right   ? HIP ARTHROPLASTY Right   ? HIP ARTHROPLASTY Left 2006  ? KNEE CARTILAGE SURGERY Bilateral   ? LUMBAR LAMINECTOMY    ? L3 - L4  ? RETINAL DETACHMENT SURGERY Right   ? x2  ? REVERSE SHOULDER ARTHROPLASTY Right 05/29/2016  ? Procedure: REVERSE SHOULDER ARTHROPLASTY;  Surgeon: Francena Hanly, MD;  Location: [facility] OR;  Service: Orthopedics;  Laterality: Right;  ? SHOULDER SURGERY Bilateral   ? TOTAL HIP REVISION Right 04/04/2020  ? Procedure: Right hip acetabular liner revision;  Surgeon: [name], MD;  Location: [facility];  Service: Orthopedics;  Laterality: Right;   ? ?Social History: ?  reports that he has quit smoking. He has never used smokeless tobacco. He reports current alcohol use. He reports that he does not use drugs. ? ?No family history on file. ? ?Medications: ?Patient's Medications  ?New Prescriptions  ? No medications on file  ?Previous Medications  ? AMLODIPINE (NORVASC) 10 MG TABLET    TAKE 1 TABLET EVERY DAY  ? ASPIRIN 81 MG TABLET    Take 81 mg by mouth  in the morning and at bedtime.  ? ATORVASTATIN (LIPITOR) 40 MG TABLET    TAKE 1 TABLET EVERY EVENING.  ? BUSPIRONE (BUSPAR) 10 MG TABLET    Take 1 tablet (10 mg total) by mouth 2 (two) times daily.  ? CALCIUM CARBONATE ANTACID (TUMS PO)    Take by mouth.  ? CHOLECALCIFEROL (VITAMIN D) 2000 UNITS CAPS    Take 2,000 Units by mouth daily.  ? CITALOPRAM (CELEXA) 10 MG TABLET    Take 1 tablet (10 mg total) by mouth daily.  ? COLESTIPOL (COLESTID) 1 G TABLET    Take 1 tablet (1 g total) by mouth 2 (two) times daily.  ? DUTASTERIDE (AVODART) 0.5 MG CAPSULE    TAKE 1 CAPSULE EVERY DAY  ? ESCITALOPRAM (LEXAPRO) 10 MG TABLET    Take 10 mg by mouth daily.  ? FLUTICASONE FUROATE 200 MCG/ACT AEPB    Inhale 1 puff into the lungs daily at 12 noon.  ? FUROSEMIDE (LASIX) 20 MG TABLET    Take 20 mg by mouth daily.   ? LATANOPROST (XALATAN) 0.005 % OPHTHALMIC SOLUTION    Place  1 drop into both eyes at bedtime.  ? MAGNESIUM OXIDE (MAG-OX) 400 MG TABLET    Take 400 mg by mouth daily.  ? MECLIZINE (ANTIVERT) 25 MG TABLET    Take 1 tablet (25 mg total) by mouth 2 (two) times daily.  ? MECLIZINE (ANTIVERT) 25 MG TABLET    Take 1 tablet (25 mg total) by mouth daily as needed for dizziness.  ? NITROGLYCERIN (MINITRAN) 0.2 MG/HR PATCH    Place 1 patch (0.2 mg total) onto the skin daily. Use I/4 patch q 24 hrs  ? NITROGLYCERIN (NITRODUR - DOSED IN MG/24 HR) 0.2 MG/HR PATCH    Place 0.2 mg onto the skin daily.  ? OMEGA-3 FATTY ACIDS (FISH OIL) 500 MG CAPS    Take 1 capsule by mouth daily.  ? OMEPRAZOLE (PRILOSEC) 20 MG CAPSULE    Take one capsule by mouth once daily as needed for acid reflux.  ? POTASSIUM CHLORIDE SA (KLOR-CON M) 20 MEQ TABLET    TAKE 1 TABLET EVERY DAY  ? PRASUGREL (EFFIENT) 10 MG TABS TABLET    Take 10 mg by mouth daily.  ? PREGABALIN (LYRICA) 100 MG CAPSULE    Take by mouth.  ? PROBIOTIC PRODUCT (PROBIOTIC PO)    Take 1 capsule by mouth daily.  ? SPIRONOLACTONE (ALDACTONE) 25 MG TABLET    Take 25 mg by mouth daily.  ? TAMSULOSIN (FLOMAX) 0.4 MG CAPS CAPSULE    Take 1 capsule (0.4 mg total) by mouth daily with supper.  ? TERAZOSIN (HYTRIN) 5 MG CAPSULE    Take 1 capsule (5 mg total) by mouth every evening.  ? TIMOLOL (TIMOPTIC) 0.5 % OPHTHALMIC SOLUTION    Place 1 drop into both eyes 2 (two) times daily.  ? TIZANIDINE (ZANAFLEX) 4 MG CAPSULE    Take 1 capsule (4 mg total) by mouth 2 (two) times daily.  ? VITAMIN B-12 (CYANOCOBALAMIN) 1000 MCG TABLET    Take 1,000 mcg by mouth daily.  ?Modified Medications  ? No medications on file  ?Discontinued Medications  ? No medications on file  ? ? ?Physical Exam: ? ?Vitals:  ? 09/24/21 1333  ?Weight: 276 lb 6.4 oz (125.4 kg)  ?Height: 5\' 10"  (1.778 m)  ? ?Body mass index is 39.66 kg/m?. ?Wt Readings from Last 3 Encounters:  ?09/24/21 276 lb 6.4 oz (125.4 kg)  ?  08/20/21 282 lb 6.4 oz (128.1 kg)  ?03/12/21 292 lb (132.5 kg)  ? ? ?Physical  Exam ?Vitals and nursing note reviewed.  ?Constitutional:   ?   Appearance: Normal appearance.  ?HENT:  ?   Head: Normocephalic.  ?Cardiovascular:  ?   Rate and Rhythm: Normal rate and regular rhythm.  ?Pulmonary:  ?   Effort: Pulmonary effort is normal.  ?   Breath sounds: Normal breath sounds.  ?Neurological:  ?   General: No focal deficit present.  ?   Mental Status: He is alert and oriented to person, place, and time.  ? ? ?Labs reviewed: ?Basic Metabolic Panel: ?Recent Labs  ?  12/10/20 ?1113 03/11/21 ?1032  ?NA 143 143  ?K 4.2 3.7  ?CL 110 111*  ?CO2 24 26  ?GLUCOSE 98 96  ?BUN 21 19  ?CREATININE 1.27* 1.30*  ?CALCIUM 9.2 9.3  ?MG  --  1.9  ?TSH 2.25  --   ? ?Liver Function Tests: ?Recent Labs  ?  12/10/20 ?1113  ?AST 17  ?ALT 15  ?BILITOT 0.6  ?PROT 6.2  ? ?No results for input(s): LIPASE, AMYLASE in the last 8760 hours. ?No results for input(s): AMMONIA in the last 8760 hours. ?CBC: ?Recent Labs  ?  12/10/20 ?1113  ?WBC 5.7  ?NEUTROABS 3,893  ?HGB 13.5  ?HCT 40.8  ?MCV 91.5  ?PLT 142  ? ?Lipid Panel: ?Recent Labs  ?  12/10/20 ?1113 03/11/21 ?1032  ?CHOL 132 136  ?HDL 31* 31*  ?LDLCALC 75 75  ?TRIG 161* 201*  ?CHOLHDL 4.3 4.4  ? ?TSH: ?Recent Labs  ?  12/10/20 ?1113  ?TSH 2.25  ? ?A1C: ?Lab Results  ?Component Value Date  ? HGBA1C 5.3 03/11/2021  ? ? ? ?Assessment/Plan ? ?1. New daily persistent headache ?We will try Fioricet 2 in the morning and 2 in the evening and add Flexeril.  Cautioned about use of Fioricet as it could be habit-forming ? ?2. Chronic left shoulder pain ?No relief from nitroglycerin patch.  Reluctant to use narcotic ? ?3. Generalized anxiety disorder ?Also has depression and hopeful that Lexapro will help both problems as we continue to treat ? ? ?Jacalyn LefevreStephen Rolen Conger, MD ?[facility] ?[phone]  ? ?

## 2021-09-24 NOTE — Telephone Encounter (Signed)
Patient wife, RoseMarie called and stated that pharmacy had question on the Fioricet that Dr. Hyacinth Meeker sent to pharmacy.  ? ?I called [facility] on [address] and spoke with Florentina Addison and she wanted to confirm directions. Stated that there were two directions listed.  ? ?Reviewed OV note from today: ?Assessment/Plan ?  ?1. New daily persistent headache ?We will try Fioricet 2 in the morning and 2 in the evening and add Flexeril.  Cautioned about use of Fioricet as it could be habit-forming ? ?Instructions given to pharmacy.  ?

## 2021-10-04 DIAGNOSIS — F339 Major depressive disorder, recurrent, unspecified: Secondary | ICD-10-CM | POA: Diagnosis not present

## 2021-10-04 DIAGNOSIS — F411 Generalized anxiety disorder: Secondary | ICD-10-CM | POA: Diagnosis not present

## 2021-10-08 DIAGNOSIS — F411 Generalized anxiety disorder: Secondary | ICD-10-CM | POA: Diagnosis not present

## 2021-10-08 DIAGNOSIS — F339 Major depressive disorder, recurrent, unspecified: Secondary | ICD-10-CM | POA: Diagnosis not present

## 2021-10-09 DIAGNOSIS — M5481 Occipital neuralgia: Secondary | ICD-10-CM | POA: Diagnosis not present

## 2021-10-09 DIAGNOSIS — Z79899 Other long term (current) drug therapy: Secondary | ICD-10-CM | POA: Diagnosis not present

## 2021-10-09 DIAGNOSIS — I10 Essential (primary) hypertension: Secondary | ICD-10-CM | POA: Diagnosis not present

## 2021-10-09 DIAGNOSIS — G894 Chronic pain syndrome: Secondary | ICD-10-CM | POA: Diagnosis not present

## 2021-10-09 DIAGNOSIS — E210 Primary hyperparathyroidism: Secondary | ICD-10-CM | POA: Diagnosis not present

## 2021-10-09 DIAGNOSIS — M47812 Spondylosis without myelopathy or radiculopathy, cervical region: Secondary | ICD-10-CM | POA: Diagnosis not present

## 2021-10-09 DIAGNOSIS — D696 Thrombocytopenia, unspecified: Secondary | ICD-10-CM | POA: Diagnosis not present

## 2021-10-09 DIAGNOSIS — M542 Cervicalgia: Secondary | ICD-10-CM | POA: Diagnosis not present

## 2021-10-14 ENCOUNTER — Other Ambulatory Visit: Payer: Self-pay | Admitting: Family Medicine

## 2021-10-15 DIAGNOSIS — I10 Essential (primary) hypertension: Secondary | ICD-10-CM

## 2021-10-15 DIAGNOSIS — J984 Other disorders of lung: Secondary | ICD-10-CM

## 2021-10-15 DIAGNOSIS — M47812 Spondylosis without myelopathy or radiculopathy, cervical region: Secondary | ICD-10-CM | POA: Diagnosis not present

## 2021-10-15 DIAGNOSIS — M48061 Spinal stenosis, lumbar region without neurogenic claudication: Secondary | ICD-10-CM

## 2021-10-15 DIAGNOSIS — G629 Polyneuropathy, unspecified: Secondary | ICD-10-CM

## 2021-10-15 DIAGNOSIS — I2510 Atherosclerotic heart disease of native coronary artery without angina pectoris: Secondary | ICD-10-CM

## 2021-10-15 DIAGNOSIS — G894 Chronic pain syndrome: Secondary | ICD-10-CM

## 2021-10-15 DIAGNOSIS — M25512 Pain in left shoulder: Secondary | ICD-10-CM | POA: Diagnosis not present

## 2021-10-17 ENCOUNTER — Telehealth: Payer: Self-pay | Admitting: *Deleted

## 2021-10-17 DIAGNOSIS — M5481 Occipital neuralgia: Secondary | ICD-10-CM | POA: Diagnosis not present

## 2021-10-17 DIAGNOSIS — G4486 Cervicogenic headache: Secondary | ICD-10-CM | POA: Diagnosis not present

## 2021-10-17 NOTE — Telephone Encounter (Signed)
Velna Hatchet with [facility] called requesting Verbal Orders to Continue Home Health PT 2x4weeks and 1x4weeks.  ? ?Verbal orders given.  ?

## 2021-10-22 DIAGNOSIS — F411 Generalized anxiety disorder: Secondary | ICD-10-CM | POA: Diagnosis not present

## 2021-10-22 DIAGNOSIS — F339 Major depressive disorder, recurrent, unspecified: Secondary | ICD-10-CM | POA: Diagnosis not present

## 2021-10-25 DIAGNOSIS — F411 Generalized anxiety disorder: Secondary | ICD-10-CM | POA: Diagnosis not present

## 2021-10-25 DIAGNOSIS — F339 Major depressive disorder, recurrent, unspecified: Secondary | ICD-10-CM | POA: Diagnosis not present

## 2021-11-06 DIAGNOSIS — I10 Essential (primary) hypertension: Secondary | ICD-10-CM | POA: Diagnosis not present

## 2021-11-06 DIAGNOSIS — E785 Hyperlipidemia, unspecified: Secondary | ICD-10-CM | POA: Diagnosis not present

## 2021-11-06 DIAGNOSIS — E669 Obesity, unspecified: Secondary | ICD-10-CM | POA: Diagnosis not present

## 2021-11-06 DIAGNOSIS — J984 Other disorders of lung: Secondary | ICD-10-CM | POA: Diagnosis not present

## 2021-11-06 DIAGNOSIS — I2510 Atherosclerotic heart disease of native coronary artery without angina pectoris: Secondary | ICD-10-CM | POA: Diagnosis not present

## 2021-11-06 DIAGNOSIS — R0602 Shortness of breath: Secondary | ICD-10-CM | POA: Diagnosis not present

## 2021-11-06 DIAGNOSIS — G4733 Obstructive sleep apnea (adult) (pediatric): Secondary | ICD-10-CM | POA: Diagnosis not present

## 2021-11-12 DIAGNOSIS — F339 Major depressive disorder, recurrent, unspecified: Secondary | ICD-10-CM | POA: Diagnosis not present

## 2021-11-12 DIAGNOSIS — F411 Generalized anxiety disorder: Secondary | ICD-10-CM | POA: Diagnosis not present

## 2021-11-14 ENCOUNTER — Other Ambulatory Visit: Payer: Medicare HMO

## 2021-11-14 DIAGNOSIS — E785 Hyperlipidemia, unspecified: Secondary | ICD-10-CM | POA: Diagnosis not present

## 2021-11-14 DIAGNOSIS — I10 Essential (primary) hypertension: Secondary | ICD-10-CM | POA: Diagnosis not present

## 2021-11-14 DIAGNOSIS — R42 Dizziness and giddiness: Secondary | ICD-10-CM | POA: Diagnosis not present

## 2021-11-15 LAB — COMPLETE METABOLIC PANEL WITH GFR
AG Ratio: 1.6 (calc) (ref 1.0–2.5)
ALT: 19 U/L (ref 9–46)
AST: 19 U/L (ref 10–35)
Albumin: 3.9 g/dL (ref 3.6–5.1)
Alkaline phosphatase (APISO): 56 U/L (ref 35–144)
BUN: 22 mg/dL (ref 7–25)
CO2: 24 mmol/L (ref 20–32)
Calcium: 9.2 mg/dL (ref 8.6–10.3)
Chloride: 107 mmol/L (ref 98–110)
Creat: 1.18 mg/dL (ref 0.70–1.22)
Globulin: 2.5 g/dL (calc) (ref 1.9–3.7)
Glucose, Bld: 86 mg/dL (ref 65–99)
Potassium: 4.1 mmol/L (ref 3.5–5.3)
Sodium: 142 mmol/L (ref 135–146)
Total Bilirubin: 0.7 mg/dL (ref 0.2–1.2)
Total Protein: 6.4 g/dL (ref 6.1–8.1)
eGFR: 61 mL/min/{1.73_m2} (ref >=60)

## 2021-11-15 LAB — CBC WITH DIFFERENTIAL/PLATELET
Absolute Monocytes: 502 cells/uL (ref 200–950)
Basophils Absolute: 59 cells/uL (ref 0–200)
Basophils Relative: 1 %
Eosinophils Absolute: 289 cells/uL (ref 15–500)
Eosinophils Relative: 4.9 %
HCT: 43.3 % (ref 38.5–50.0)
Hemoglobin: 14.6 g/dL (ref 13.2–17.1)
Lymphs Abs: 1369 cells/uL (ref 850–3900)
MCH: 31.3 pg (ref 27.0–33.0)
MCHC: 33.7 g/dL (ref 32.0–36.0)
MCV: 92.9 fL (ref 80.0–100.0)
MPV: 11.2 fL (ref 7.5–12.5)
Monocytes Relative: 8.5 %
Neutro Abs: 3682 cells/uL (ref 1500–7800)
Neutrophils Relative %: 62.4 %
Platelets: 149 10*3/uL (ref 140–400)
RBC: 4.66 10*6/uL (ref 4.20–5.80)
RDW: 13.5 % (ref 11.0–15.0)
Total Lymphocyte: 23.2 %
WBC: 5.9 10*3/uL (ref 3.8–10.8)

## 2021-11-15 LAB — LIPID PANEL
Cholesterol: 204 mg/dL — ABNORMAL HIGH (ref <200)
HDL: 30 mg/dL — ABNORMAL LOW (ref >=40)
LDL Cholesterol (Calc): 136 mg/dL (calc) — ABNORMAL HIGH
Non-HDL Cholesterol (Calc): 174 mg/dL (calc) — ABNORMAL HIGH (ref <130)
Total CHOL/HDL Ratio: 6.8 (calc) — ABNORMAL HIGH (ref <5.0)
Triglycerides: 250 mg/dL — ABNORMAL HIGH (ref <150)

## 2021-11-15 LAB — TSH: TSH: 2 mIU/L (ref 0.40–4.50)

## 2021-11-19 ENCOUNTER — Ambulatory Visit (INDEPENDENT_AMBULATORY_CARE_PROVIDER_SITE_OTHER): Payer: Medicare HMO | Admitting: Family Medicine

## 2021-11-19 ENCOUNTER — Encounter: Payer: Self-pay | Admitting: Family Medicine

## 2021-11-19 VITALS — BP 144/92 | HR 61 | Temp 97.8°F | Ht 70.0 in | Wt 271.8 lb

## 2021-11-19 DIAGNOSIS — F331 Major depressive disorder, recurrent, moderate: Secondary | ICD-10-CM

## 2021-11-19 DIAGNOSIS — I10 Essential (primary) hypertension: Secondary | ICD-10-CM

## 2021-11-19 DIAGNOSIS — F411 Generalized anxiety disorder: Secondary | ICD-10-CM | POA: Diagnosis not present

## 2021-11-19 DIAGNOSIS — R5382 Chronic fatigue, unspecified: Secondary | ICD-10-CM

## 2021-11-19 NOTE — Patient Instructions (Signed)
Hold Lyrica to see if dizziness improves Re-start atorvastatin

## 2021-11-19 NOTE — Progress Notes (Signed)
Provider:  Jacalyn Lefevre, MD  Careteam: Patient Care Team: Frederica Kuster, MD as PCP - General (Family Medicine)  PLACE OF SERVICE:  [facility]  Advanced Directive information    No Known Allergies  Chief Complaint  Patient presents with   Medical Management of Chronic Issues    Patient presents today for a 3 month follow-up.   Quality Metric Gaps    COVID booster #5     HPI: Patient is a 86 y.o. male Jeffery [name] continues with chronic headaches.  He has seen pain management from [facility].  Currently taking buprenorphine, with only modest relief of pain.  Currently also receiving acupuncture with what he describes as minimal effect.  He is scheduled for some sort of nerve block in his neck in about 2 weeks through the pain clinic. He continues to sit in his recliner most of the day and sleep He had lab work done last week which shows lipids have gone up.  Had stopped statin hoping that may be in doing so his overall pain would lessen but that has not been the case and since cholesterol is gone up, I believe we should restart statin. He complains more of fatigue today and would like to have his testosterone checked.  I explained that we can do that but if testosterone level is normal would not recommend adding additional due to cardiovascular risk  Review of Systems:  Review of Systems  Constitutional:  Positive for malaise/fatigue.  Respiratory: Negative.    Cardiovascular: Negative.   Musculoskeletal:  Positive for neck pain.  Neurological:  Positive for headaches.  Psychiatric/Behavioral:  Positive for depression.   All other systems reviewed and are negative.  Past Medical History:  Diagnosis Date   Anxiety    BPH (benign prostatic hyperplasia)    Coronary artery disease    Depression    Dizziness    Dyspnea    with exertion    Fecal incontinence    GERD (gastroesophageal reflux disease)    Glaucoma    Hard of hearing    Headache    History of  pneumonia    Hyperlipidemia    Hypertension    Memory impairment    Migraine headache    Neck fracture (HCC)    2-3/2-21 followed by DR Sandi Carne in [place]    Nocturia    Osteoarthritis    Restrictive lung disease    Sleep apnea    wears CPAP does not know setting    Thyroid disease    Hyperparathyroidism    Urinary frequency    Urinary incontinence    Wears glasses    Past Surgical History:  Procedure Laterality Date   CARDIAC CATHETERIZATION     CIRCUMCISION     COLONOSCOPY     CORONARY ANGIOPLASTY     stents- 08/2019    ESOPHAGOGASTRODUODENOSCOPY     EYE SURGERY Bilateral    cataract   HAND SURGERY Right    HIP ARTHROPLASTY Right    HIP ARTHROPLASTY Left 2006   KNEE CARTILAGE SURGERY Bilateral    LUMBAR LAMINECTOMY     L3 - L4   RETINAL DETACHMENT SURGERY Right    x2   REVERSE SHOULDER ARTHROPLASTY Right 05/29/2016   Procedure: REVERSE SHOULDER ARTHROPLASTY;  Surgeon: Francena Hanly, MD;  Location: [facility] OR;  Service: Orthopedics;  Laterality: Right;   SHOULDER SURGERY Bilateral    TOTAL HIP REVISION Right 04/04/2020   Procedure: Right hip acetabular liner revision;  Surgeon: [name], MD;  Location: [facility] OR[facility];  Service: Orthopedics;  Laterality: Right;    Social History:   reports that he has quit smoking. He has never used smokeless tobacco. He reports current alcohol use. He reports that he does not use drugs.  History reviewed. No pertinent family history.  Medications: Patient's Medications  New Prescriptions   No medications on file  Previous Medications   AMLODIPINE (NORVASC) 10 MG TABLET    TAKE 1 TABLET EVERY DAY   ASPIRIN 81 MG TABLET    Take 81 mg by mouth in the morning and at bedtime.   BUTALBITAL-ACETAMINOPHEN-CAFFEINE (FIORICET) 50-325-40 MG TABLET    Take Two tablet in the morning and take two tablets in the evening.   CALCIUM CARBONATE ANTACID (TUMS PO)    Take by mouth.   CHOLECALCIFEROL (VITAMIN D) 2000 UNITS CAPS    Take 2,000 Units  by mouth daily.   COLESTIPOL (COLESTID) 1 G TABLET    Take 1 tablet (1 g total) by mouth 2 (two) times daily.   CYCLOBENZAPRINE (FLEXERIL) 10 MG TABLET    Take 1 tablet (10 mg total) by mouth 3 (three) times daily as needed for muscle spasms.   ESCITALOPRAM (LEXAPRO) 10 MG TABLET    Take 10 mg by mouth daily.   FLUTICASONE FUROATE 200 MCG/ACT AEPB    Inhale 1 puff into the lungs daily at 12 noon.   FUROSEMIDE (LASIX) 20 MG TABLET    Take 20 mg by mouth daily.    LATANOPROST (XALATAN) 0.005 % OPHTHALMIC SOLUTION    Place 1 drop into both eyes at bedtime.   MAGNESIUM OXIDE (MAG-OX) 400 MG TABLET    Take 400 mg by mouth daily.   NITROGLYCERIN (MINITRAN) 0.2 MG/HR PATCH    Place 1 patch (0.2 mg total) onto the skin daily. Use I/4 patch q 24 hrs   NITROGLYCERIN (NITRODUR - DOSED IN MG/24 HR) 0.2 MG/HR PATCH    Place 0.2 mg onto the skin daily.   OMEPRAZOLE (PRILOSEC) 20 MG CAPSULE    TAKE 1 CAPSULE ONE TIME DAILY AS NEEDED FOR ACID REFLUX   POTASSIUM CHLORIDE SA (KLOR-CON M) 20 MEQ TABLET    TAKE 1 TABLET EVERY DAY   PRASUGREL (EFFIENT) 10 MG TABS TABLET    Take 10 mg by mouth daily.   PROBIOTIC PRODUCT (PROBIOTIC PO)    Take 1 capsule by mouth daily.   SPIRONOLACTONE (ALDACTONE) 25 MG TABLET    Take 25 mg by mouth daily.   TAMSULOSIN (FLOMAX) 0.4 MG CAPS CAPSULE    Take 1 capsule (0.4 mg total) by mouth daily with supper.   TERAZOSIN (HYTRIN) 5 MG CAPSULE    Take 1 capsule (5 mg total) by mouth every evening.   TIMOLOL (TIMOPTIC) 0.5 % OPHTHALMIC SOLUTION    Place 1 drop into both eyes 2 (two) times daily.   VITAMIN B-12 (CYANOCOBALAMIN) 1000 MCG TABLET    Take 1,000 mcg by mouth daily.  Modified Medications   No medications on file  Discontinued Medications   ATORVASTATIN (LIPITOR) 40 MG TABLET    TAKE 1 TABLET EVERY EVENING.   BUSPIRONE (BUSPAR) 10 MG TABLET    Take 1 tablet (10 mg total) by mouth 2 (two) times daily.   CITALOPRAM (CELEXA) 10 MG TABLET    Take 1 tablet (10 mg total) by mouth  daily.   DUTASTERIDE (AVODART) 0.5 MG CAPSULE    TAKE 1 CAPSULE EVERY DAY   MECLIZINE (ANTIVERT)  25 MG TABLET    Take 1 tablet (25 mg total) by mouth 2 (two) times daily.   MECLIZINE (ANTIVERT) 25 MG TABLET    Take 1 tablet (25 mg total) by mouth daily as needed for dizziness.   OMEGA-3 FATTY ACIDS (FISH OIL) 500 MG CAPS    Take 1 capsule by mouth daily.   PREGABALIN (LYRICA) 100 MG CAPSULE    Take by mouth.   TIZANIDINE (ZANAFLEX) 4 MG CAPSULE    Take 1 capsule (4 mg total) by mouth 2 (two) times daily.    Physical Exam:  Vitals:   11/19/21 1325  BP: (!) 144/92  Pulse: 61  Temp: 97.8 F (36.6 C)  SpO2: 97%  Weight: 271 lb 12.8 oz (123.3 kg)  Height: 5\' 10"  (1.778 m)   Body mass index is 39 kg/m. Wt Readings from Last 3 Encounters:  11/19/21 271 lb 12.8 oz (123.3 kg)  09/24/21 276 lb 6.4 oz (125.4 kg)  08/20/21 282 lb 6.4 oz (128.1 kg)    Physical Exam Vitals and nursing note reviewed.  Constitutional:      Appearance: He is obese.  HENT:     Head: Normocephalic.  Cardiovascular:     Rate and Rhythm: Normal rate and regular rhythm.  Pulmonary:     Effort: Pulmonary effort is normal.     Breath sounds: Normal breath sounds.  Neurological:     General: No focal deficit present.     Mental Status: He is alert and oriented to person, place, and time.  Psychiatric:        Behavior: Behavior normal.    Labs reviewed: Basic Metabolic Panel: Recent Labs    12/10/20 1113 03/11/21 1032 11/14/21 1039  NA 143 143 142  K 4.2 3.7 4.1  CL 110 111* 107  CO2 24 26 24   GLUCOSE 98 96 86  BUN 21 19 22   CREATININE 1.27* 1.30* 1.18  CALCIUM 9.2 9.3 9.2  MG  --  1.9  --   TSH 2.25  --  2.00   Liver Function Tests: Recent Labs    12/10/20 1113 11/14/21 1039  AST 17 19  ALT 15 19  BILITOT 0.6 0.7  PROT 6.2 6.4   No results for input(s): LIPASE, AMYLASE in the last 8760 hours. No results for input(s): AMMONIA in the last 8760 hours. CBC: Recent Labs     12/10/20 1113 11/14/21 1039  WBC 5.7 5.9  NEUTROABS 3,893 3,682  HGB 13.5 14.6  HCT 40.8 43.3  MCV 91.5 92.9  PLT 142 149   Lipid Panel: Recent Labs    12/10/20 1113 03/11/21 1032 11/14/21 1039  CHOL 132 136 204*  HDL 31* 31* 30*  LDLCALC 75 75 136*  TRIG 161* 201* 250*  CHOLHDL 4.3 4.4 6.8*   TSH: Recent Labs    12/10/20 1113 11/14/21 1039  TSH 2.25 2.00   A1C: Lab Results  Component Value Date   HGBA1C 5.3 03/11/2021     Assessment/Plan  Chronic fatigue - Plan: Testosterone  Generalized anxiety disorder  Moderate episode of recurrent major depressive disorder (HCC)  Primary hypertension   Jacalyn LefevreStephen Apolinar Bero, MD [facility] [phone]

## 2021-11-22 DIAGNOSIS — F339 Major depressive disorder, recurrent, unspecified: Secondary | ICD-10-CM | POA: Diagnosis not present

## 2021-11-22 DIAGNOSIS — F411 Generalized anxiety disorder: Secondary | ICD-10-CM | POA: Diagnosis not present

## 2021-11-27 ENCOUNTER — Telehealth: Payer: Self-pay | Admitting: *Deleted

## 2021-11-27 NOTE — Telephone Encounter (Signed)
Patient wife, RoseMarie called requesting Personal Care Services. Stated that she spoke with [facility] and they told her that PCP would need to initiate.   Form, PCS [id number] printed and filled out and placed for Dr. Hyacinth Meeker to Approve and sign.   Placed form in his folder to review.   To be faxed to [facility] once completed Fax: [phone]

## 2021-11-28 NOTE — Telephone Encounter (Signed)
PCS Form Completed and signed by Dr. Corene Cornea to [facility] Fax: [phone].   Copy sent to scanning.

## 2021-11-29 DIAGNOSIS — F339 Major depressive disorder, recurrent, unspecified: Secondary | ICD-10-CM | POA: Diagnosis not present

## 2021-11-29 DIAGNOSIS — F411 Generalized anxiety disorder: Secondary | ICD-10-CM | POA: Diagnosis not present

## 2021-12-04 DIAGNOSIS — G894 Chronic pain syndrome: Secondary | ICD-10-CM | POA: Diagnosis not present

## 2021-12-04 DIAGNOSIS — R519 Headache, unspecified: Secondary | ICD-10-CM | POA: Diagnosis not present

## 2021-12-04 DIAGNOSIS — G500 Trigeminal neuralgia: Secondary | ICD-10-CM | POA: Diagnosis not present

## 2021-12-10 ENCOUNTER — Other Ambulatory Visit: Payer: Self-pay | Admitting: Family Medicine

## 2021-12-18 DIAGNOSIS — F339 Major depressive disorder, recurrent, unspecified: Secondary | ICD-10-CM | POA: Diagnosis not present

## 2021-12-18 DIAGNOSIS — F411 Generalized anxiety disorder: Secondary | ICD-10-CM | POA: Diagnosis not present

## 2021-12-23 ENCOUNTER — Telehealth: Payer: Self-pay

## 2021-12-23 DIAGNOSIS — R519 Headache, unspecified: Secondary | ICD-10-CM | POA: Diagnosis not present

## 2021-12-23 DIAGNOSIS — G500 Trigeminal neuralgia: Secondary | ICD-10-CM | POA: Diagnosis not present

## 2021-12-23 DIAGNOSIS — M5441 Lumbago with sciatica, right side: Secondary | ICD-10-CM | POA: Diagnosis not present

## 2021-12-23 DIAGNOSIS — M961 Postlaminectomy syndrome, not elsewhere classified: Secondary | ICD-10-CM | POA: Diagnosis not present

## 2021-12-23 DIAGNOSIS — G4486 Cervicogenic headache: Secondary | ICD-10-CM | POA: Diagnosis not present

## 2021-12-23 DIAGNOSIS — M5481 Occipital neuralgia: Secondary | ICD-10-CM | POA: Diagnosis not present

## 2021-12-23 DIAGNOSIS — M5442 Lumbago with sciatica, left side: Secondary | ICD-10-CM | POA: Diagnosis not present

## 2021-12-24 ENCOUNTER — Other Ambulatory Visit: Payer: Self-pay | Admitting: Family Medicine

## 2021-12-25 ENCOUNTER — Encounter: Payer: Self-pay | Admitting: Orthopedic Surgery

## 2021-12-26 ENCOUNTER — Telehealth: Payer: Self-pay

## 2021-12-26 ENCOUNTER — Encounter: Payer: Self-pay | Admitting: Orthopedic Surgery

## 2021-12-26 ENCOUNTER — Ambulatory Visit (INDEPENDENT_AMBULATORY_CARE_PROVIDER_SITE_OTHER): Payer: Medicare HMO | Admitting: Orthopedic Surgery

## 2021-12-26 VITALS — BP 132/80 | HR 65 | Temp 97.3°F | Resp 18 | Ht 70.0 in | Wt 250.0 lb

## 2021-12-26 DIAGNOSIS — R410 Disorientation, unspecified: Secondary | ICD-10-CM | POA: Diagnosis not present

## 2021-12-26 DIAGNOSIS — R531 Weakness: Secondary | ICD-10-CM

## 2021-12-26 DIAGNOSIS — R350 Frequency of micturition: Secondary | ICD-10-CM | POA: Diagnosis not present

## 2021-12-26 DIAGNOSIS — R269 Unspecified abnormalities of gait and mobility: Secondary | ICD-10-CM | POA: Diagnosis not present

## 2021-12-26 DIAGNOSIS — R441 Visual hallucinations: Secondary | ICD-10-CM | POA: Diagnosis not present

## 2021-12-26 NOTE — Telephone Encounter (Signed)
It looks like he has been seen by [facility] 11/21/2021[facility] location, Dr. Curt Bears- [phone].

## 2021-12-26 NOTE — Patient Instructions (Addendum)
Please call Dr. Allena Katz to schedule follow up due to ongoing weakness and behaviors  Please call [facility] to schedule PT-they are welcome to fax paperwork to [phone]  Recommend using urinal at night if he continues to fall while using bathroom  Consider grab bars in bathroom

## 2021-12-26 NOTE — Progress Notes (Signed)
Careteam: Patient Care Team: Frederica Kuster, MD as PCP - General (Family Medicine)  Seen by: Hazle Nordmann, AGNP-C  PLACE OF SERVICE:  [facility]  Advanced Directive information Does Patient Have a Medical Advance Directive?: Yes, Type of Advance Directive: Healthcare Power of Attorney, Does patient want to make changes to medical advance directive?: No - Patient declined  No Known Allergies  Chief Complaint  Patient presents with   Acute Visit    Patient is being seen for balance issues, disorientation, agitation, paranoia hard to get in and out of car or out of bed or other furniture for last 2 weeks     HPI: Patient is a 86 y.o. male seen today for acute visit due to balance issues.   Wife present during encounter.   Onset x 2 weeks. 06/28 he was observed playing with imaginary knife. He was also seeing objects that were not there. Behaviors occurring more in evening. Wife also reports increased agitation at night. He is followed by neurology- Dr. Allena Katz. Neuropsychology evaluation 12/2015- no cognitive disorder. MRI 02/2020 revealed moderate chronic microvascular ischemic changes in white matter. MMSE 28/30, 26/30 in 2017. Todays MMSE 25/30, correct shapes and clock.   Fall 06/28 while trying to use the bathroom. No apparent injury. Using FWW at this time, was using rolator. Using wheelchair for long distances. Requesting physical therapy referral.   H/o BPH. Does not drink water well. Using bathroom more x 3 days, especially at night. Afebrile, no CVA pain.   Headaches continue to be bothersome. He is taking tylenol and fiorcet. Followed by [facility], scheduled to have injections tomorrow.     Review of Systems:  Review of Systems  Constitutional:  Positive for malaise/fatigue. Negative for chills, fever and weight loss.  HENT:  Positive for hearing loss. Negative for congestion and sore throat.   Eyes:  Negative for blurred vision and double vision.   Respiratory:  Negative for cough, shortness of breath and wheezing.   Cardiovascular:  Negative for chest pain and leg swelling.  Gastrointestinal:  Negative for abdominal pain, blood in stool, constipation, diarrhea, heartburn, nausea and vomiting.  Genitourinary:  Positive for frequency and urgency. Negative for dysuria and flank pain.  Musculoskeletal:  Positive for falls and myalgias.  Neurological:  Positive for tremors, weakness and headaches. Negative for dizziness.  Psychiatric/Behavioral:  Positive for depression, hallucinations and memory loss.     Past Medical History:  Diagnosis Date   Anxiety    BPH (benign prostatic hyperplasia)    Coronary artery disease    Depression    Dizziness    Dyspnea    with exertion    Fecal incontinence    GERD (gastroesophageal reflux disease)    Glaucoma    Hard of hearing    Headache    History of pneumonia    Hyperlipidemia    Hypertension    Memory impairment    Migraine headache    Neck fracture (HCC)    2-3/2-21 followed by DR Sandi Carne in [place]    Nocturia    Osteoarthritis    Restrictive lung disease    Sleep apnea    wears CPAP does not know setting    Thyroid disease    Hyperparathyroidism    Urinary frequency    Urinary incontinence    Wears glasses    Past Surgical History:  Procedure Laterality Date   CARDIAC CATHETERIZATION     CIRCUMCISION     COLONOSCOPY  CORONARY ANGIOPLASTY     stents- 08/2019    ESOPHAGOGASTRODUODENOSCOPY     EYE SURGERY Bilateral    cataract   HAND SURGERY Right    HIP ARTHROPLASTY Right    HIP ARTHROPLASTY Left 2006   KNEE CARTILAGE SURGERY Bilateral    LUMBAR LAMINECTOMY     L3 - L4   RETINAL DETACHMENT SURGERY Right    x2   REVERSE SHOULDER ARTHROPLASTY Right 05/29/2016   Procedure: REVERSE SHOULDER ARTHROPLASTY;  Surgeon: Francena Hanly, MD;  Location: [facility] OR;  Service: Orthopedics;  Laterality: Right;   SHOULDER SURGERY Bilateral    TOTAL HIP REVISION Right 04/04/2020    Procedure: Right hip acetabular liner revision;  Surgeon: [name], MD;  Location: [facility] ORS;  Service: Orthopedics;  Laterality: Right;    Social History:   reports that he has quit smoking. He has never used smokeless tobacco. He reports current alcohol use. He reports that he does not use drugs.  History reviewed. No pertinent family history.  Medications: Patient's Medications  New Prescriptions   No medications on file  Previous Medications   AMLODIPINE (NORVASC) 10 MG TABLET    TAKE 1 TABLET EVERY DAY   ASPIRIN 81 MG TABLET    Take 81 mg by mouth in the morning and at bedtime.   BUTALBITAL-ACETAMINOPHEN-CAFFEINE (FIORICET) 50-325-40 MG TABLET    Take Two tablet in the morning and take two tablets in the evening.   CALCIUM CARBONATE ANTACID (TUMS PO)    Take by mouth.   CHOLECALCIFEROL (VITAMIN D) 2000 UNITS CAPS    Take 2,000 Units by mouth daily.   COLESTIPOL (COLESTID) 1 G TABLET    Take 1 tablet (1 g total) by mouth 2 (two) times daily.   CYCLOBENZAPRINE (FLEXERIL) 10 MG TABLET    Take 1 tablet (10 mg total) by mouth 3 (three) times daily as needed for muscle spasms.   ESCITALOPRAM (LEXAPRO) 10 MG TABLET    Take 20 mg by mouth daily.   FLUTICASONE FUROATE 200 MCG/ACT AEPB    Inhale 1 puff into the lungs daily at 12 noon.   FUROSEMIDE (LASIX) 20 MG TABLET    Take 20 mg by mouth daily.    LATANOPROST (XALATAN) 0.005 % OPHTHALMIC SOLUTION    Place 1 drop into both eyes at bedtime.   MAGNESIUM OXIDE (MAG-OX) 400 MG TABLET    Take 400 mg by mouth daily.   NITROGLYCERIN (MINITRAN) 0.2 MG/HR PATCH    Place 1 patch (0.2 mg total) onto the skin daily. Use I/4 patch q 24 hrs   NITROGLYCERIN (NITRODUR - DOSED IN MG/24 HR) 0.2 MG/HR PATCH    Place 0.2 mg onto the skin daily.   OMEPRAZOLE (PRILOSEC) 20 MG CAPSULE    TAKE 1 CAPSULE ONE TIME DAILY AS NEEDED FOR ACID REFLUX   POTASSIUM CHLORIDE SA (KLOR-CON M) 20 MEQ TABLET    TAKE 1 TABLET EVERY DAY   PRASUGREL (EFFIENT) 10 MG TABS  TABLET    Take 10 mg by mouth daily.   PROBIOTIC PRODUCT (PROBIOTIC PO)    Take 1 capsule by mouth daily.   SPIRONOLACTONE (ALDACTONE) 25 MG TABLET    Take 25 mg by mouth daily.   TAMSULOSIN (FLOMAX) 0.4 MG CAPS CAPSULE    TAKE 1 CAPSULE BY MOUTH ONCE DAILY WITH SUPPER   TERAZOSIN (HYTRIN) 5 MG CAPSULE    Take 1 capsule (5 mg total) by mouth every evening.   TIMOLOL (TIMOPTIC) 0.5 % OPHTHALMIC SOLUTION  Place 1 drop into both eyes 2 (two) times daily.   VITAMIN B-12 (CYANOCOBALAMIN) 1000 MCG TABLET    Take 1,000 mcg by mouth daily.  Modified Medications   No medications on file  Discontinued Medications   No medications on file    Physical Exam:  Vitals:   12/26/21 1048  BP: 132/80  Pulse: 65  Resp: 18  Temp: (!) 97.3 F (36.3 C)  TempSrc: Temporal  SpO2: 94%  Weight: 250 lb (113.4 kg)  Height: 5\' 10"  (1.778 m)   Body mass index is 35.87 kg/m. Wt Readings from Last 3 Encounters:  12/26/21 250 lb (113.4 kg)  11/19/21 271 lb 12.8 oz (123.3 kg)  09/24/21 276 lb 6.4 oz (125.4 kg)    Physical Exam Vitals reviewed.  Constitutional:      Appearance: He is obese.  HENT:     Head: Normocephalic.     Right Ear: There is no impacted cerumen.     Left Ear: There is no impacted cerumen.     Nose: Nose normal.     Mouth/Throat:     Mouth: Mucous membranes are moist.  Eyes:     General:        Right eye: No discharge.        Left eye: No discharge.     Extraocular Movements: Extraocular movements intact.     Right eye: Normal extraocular motion and no nystagmus.     Left eye: Normal extraocular motion and no nystagmus.     Pupils: Pupils are equal, round, and reactive to light.  Cardiovascular:     Rate and Rhythm: Normal rate and regular rhythm.     Pulses: Normal pulses.     Heart sounds: Normal heart sounds.  Pulmonary:     Effort: Pulmonary effort is normal. No respiratory distress.     Breath sounds: Normal breath sounds. No wheezing.  Abdominal:     General:  Bowel sounds are normal. There is no distension.     Palpations: Abdomen is soft.     Tenderness: There is no abdominal tenderness.  Musculoskeletal:     Cervical back: Neck supple.     Right lower leg: No edema.     Left lower leg: No edema.  Skin:    General: Skin is warm and dry.     Capillary Refill: Capillary refill takes less than 2 seconds.  Neurological:     General: No focal deficit present.     Mental Status: He is alert. Mental status is at baseline.     Sensory: No sensory deficit.     Motor: Weakness present.     Coordination: Coordination abnormal.     Gait: Gait abnormal.     Deep Tendon Reflexes: Reflexes normal.     Comments: Hand grips 5/5, dorsiflexion 5/5  Psychiatric:        Mood and Affect: Mood normal.        Behavior: Behavior normal.     Labs reviewed: Basic Metabolic Panel: Recent Labs    03/11/21 1032 11/14/21 1039  NA 143 142  K 3.7 4.1  CL 111* 107  CO2 26 24  GLUCOSE 96 86  BUN 19 22  CREATININE 1.30* 1.18  CALCIUM 9.3 9.2  MG 1.9  --   TSH  --  2.00   Liver Function Tests: Recent Labs    11/14/21 1039  AST 19  ALT 19  BILITOT 0.7  PROT 6.4   No results for input(s): "  LIPASE", "AMYLASE" in the last 8760 hours. No results for input(s): "AMMONIA" in the last 8760 hours. CBC: Recent Labs    11/14/21 1039  WBC 5.9  NEUTROABS 3,682  HGB 14.6  HCT 43.3  MCV 92.9  PLT 149   Lipid Panel: Recent Labs    03/11/21 1032 11/14/21 1039  CHOL 136 204*  HDL 31* 30*  LDLCALC 75 136*  TRIG 201* 250*  CHOLHDL 4.4 6.8*   TSH: Recent Labs    11/14/21 1039  TSH 2.00   A1C: Lab Results  Component Value Date   HGBA1C 5.3 03/11/2021     Assessment/Plan 1. Abnormal gait - needs wheelchair for long distances - discussed falls safety home- grab bars in bathroom/ using urinal - Ambulatory referral to Physical Therapy  2. Weakness - see above - Ambulatory referral to Physical Therapy  3. Visual hallucinations - more at  night x 2 weeks, also agitated more - MRI brain 2021 revealed moderate chronic microvascular ischemic changes in white matter - MMSE 28/30 in 2017, now 25/30 today - suspect behaviors associated with sundowning/dementia - advised to schedule with neurology- Dr. Allena Katz - recommend Aricept if behaviors continue - Basic Metabolic Panel  4. Urinary frequency - increased frequency at night x 3 nights - afebrile, no CVA pain - Culture, Urine - Urinalysis with Reflex Microscopic  Total time: 35 minutes. Greater than 50% of total time spent doing patient education regarding hallucinations, urinary frequency, headaches, symptom/medication management.     Next appt: none Manmeet Arzola Scherry Ran  [facility] [phone]

## 2021-12-26 NOTE — Telephone Encounter (Signed)
Returned Rosemarie's call to notify of Amy's response but no answer and mailbox is full.

## 2021-12-26 NOTE — Telephone Encounter (Signed)
Jeffery [name], the patient's wife states she thought they had an neurologist but who she thought is actually an anesthesiologist and will need a referral.

## 2021-12-27 ENCOUNTER — Telehealth: Payer: Self-pay | Admitting: *Deleted

## 2021-12-27 ENCOUNTER — Other Ambulatory Visit: Payer: Self-pay | Admitting: Orthopedic Surgery

## 2021-12-27 DIAGNOSIS — R269 Unspecified abnormalities of gait and mobility: Secondary | ICD-10-CM

## 2021-12-27 DIAGNOSIS — G4452 New daily persistent headache (NDPH): Secondary | ICD-10-CM

## 2021-12-27 DIAGNOSIS — G500 Trigeminal neuralgia: Secondary | ICD-10-CM | POA: Diagnosis not present

## 2021-12-27 DIAGNOSIS — R441 Visual hallucinations: Secondary | ICD-10-CM

## 2021-12-27 LAB — URINE CULTURE
MICRO NUMBER:: 13590190
SPECIMEN QUALITY:: ADEQUATE

## 2021-12-27 LAB — URINALYSIS, ROUTINE W REFLEX MICROSCOPIC
Bacteria, UA: NONE SEEN /HPF
Bilirubin Urine: NEGATIVE
Glucose, UA: NEGATIVE
Hgb urine dipstick: NEGATIVE
Hyaline Cast: NONE SEEN /LPF
Ketones, ur: NEGATIVE
Leukocytes,Ua: NEGATIVE
Nitrite: NEGATIVE
RBC / HPF: NONE SEEN /HPF (ref 0–2)
Specific Gravity, Urine: 1.014 (ref 1.001–1.035)
Squamous Epithelial / HPF: NONE SEEN /HPF (ref <=5)
WBC, UA: NONE SEEN /HPF (ref 0–5)
pH: 6.5 (ref 5.0–8.0)

## 2021-12-27 LAB — BASIC METABOLIC PANEL
BUN/Creatinine Ratio: 11 (calc) (ref 6–22)
BUN: 14 mg/dL (ref 7–25)
CO2: 24 mmol/L (ref 20–32)
Calcium: 9.7 mg/dL (ref 8.6–10.3)
Chloride: 104 mmol/L (ref 98–110)
Creat: 1.33 mg/dL — ABNORMAL HIGH (ref 0.70–1.22)
Glucose, Bld: 92 mg/dL (ref 65–99)
Potassium: 4 mmol/L (ref 3.5–5.3)
Sodium: 139 mmol/L (ref 135–146)

## 2021-12-27 LAB — MICROSCOPIC MESSAGE

## 2021-12-27 NOTE — Telephone Encounter (Signed)
Discussed with the patient's wife.

## 2021-12-27 NOTE — Telephone Encounter (Signed)
Patient wife, Jeffery [name] called and stated that patient was seen yesterday and you recommended patient seeing a Neurologist. She stated that she called Neurologist Dr. Larina Earthly office and they stated that it has been a while since patient was seen there and they need a referral placed.   Requesting a referral to be placed to Dr. Antonietta Barcelona

## 2021-12-27 NOTE — Progress Notes (Signed)
Referral to Palliative per wife request.

## 2021-12-27 NOTE — Telephone Encounter (Signed)
Fargo, Amy E, NP  You 1 hour ago (1:31 PM)    External referral made for Dr. Antonietta Barcelona

## 2021-12-30 ENCOUNTER — Encounter (HOSPITAL_BASED_OUTPATIENT_CLINIC_OR_DEPARTMENT_OTHER): Payer: Self-pay | Admitting: Obstetrics and Gynecology

## 2021-12-30 ENCOUNTER — Telehealth: Payer: Self-pay

## 2021-12-30 ENCOUNTER — Other Ambulatory Visit: Payer: Self-pay

## 2021-12-30 ENCOUNTER — Inpatient Hospital Stay (HOSPITAL_BASED_OUTPATIENT_CLINIC_OR_DEPARTMENT_OTHER)
Admission: EM | Admit: 2021-12-30 | Discharge: 2022-01-07 | DRG: 091 | Disposition: A | Discharge status: Skilled Nursing Facility | Payer: Medicare HMO | Attending: Family Medicine | Admitting: Family Medicine

## 2021-12-30 ENCOUNTER — Emergency Department (HOSPITAL_BASED_OUTPATIENT_CLINIC_OR_DEPARTMENT_OTHER): Payer: Medicare HMO

## 2021-12-30 DIAGNOSIS — J984 Other disorders of lung: Secondary | ICD-10-CM | POA: Diagnosis not present

## 2021-12-30 DIAGNOSIS — T43295A Adverse effect of other antidepressants, initial encounter: Secondary | ICD-10-CM | POA: Diagnosis present

## 2021-12-30 DIAGNOSIS — T481X5A Adverse effect of skeletal muscle relaxants [neuromuscular blocking agents], initial encounter: Secondary | ICD-10-CM | POA: Diagnosis present

## 2021-12-30 DIAGNOSIS — R443 Hallucinations, unspecified: Secondary | ICD-10-CM | POA: Diagnosis not present

## 2021-12-30 DIAGNOSIS — E785 Hyperlipidemia, unspecified: Secondary | ICD-10-CM | POA: Diagnosis present

## 2021-12-30 DIAGNOSIS — L22 Diaper dermatitis: Secondary | ICD-10-CM | POA: Diagnosis not present

## 2021-12-30 DIAGNOSIS — L89322 Pressure ulcer of left buttock, stage 2: Secondary | ICD-10-CM | POA: Diagnosis present

## 2021-12-30 DIAGNOSIS — R351 Nocturia: Secondary | ICD-10-CM

## 2021-12-30 DIAGNOSIS — Z66 Do not resuscitate: Secondary | ICD-10-CM | POA: Diagnosis not present

## 2021-12-30 DIAGNOSIS — Z79899 Other long term (current) drug therapy: Secondary | ICD-10-CM | POA: Diagnosis not present

## 2021-12-30 DIAGNOSIS — F0283 Dementia in other diseases classified elsewhere, unspecified severity, with mood disturbance: Secondary | ICD-10-CM | POA: Diagnosis present

## 2021-12-30 DIAGNOSIS — R4182 Altered mental status, unspecified: Secondary | ICD-10-CM | POA: Diagnosis not present

## 2021-12-30 DIAGNOSIS — N179 Acute kidney failure, unspecified: Secondary | ICD-10-CM | POA: Diagnosis not present

## 2021-12-30 DIAGNOSIS — Z7951 Long term (current) use of inhaled steroids: Secondary | ICD-10-CM | POA: Diagnosis not present

## 2021-12-30 DIAGNOSIS — L89312 Pressure ulcer of right buttock, stage 2: Secondary | ICD-10-CM | POA: Diagnosis not present

## 2021-12-30 DIAGNOSIS — K219 Gastro-esophageal reflux disease without esophagitis: Secondary | ICD-10-CM | POA: Diagnosis present

## 2021-12-30 DIAGNOSIS — E876 Hypokalemia: Secondary | ICD-10-CM | POA: Diagnosis present

## 2021-12-30 DIAGNOSIS — I959 Hypotension, unspecified: Secondary | ICD-10-CM | POA: Diagnosis present

## 2021-12-30 DIAGNOSIS — Z96611 Presence of right artificial shoulder joint: Secondary | ICD-10-CM | POA: Diagnosis present

## 2021-12-30 DIAGNOSIS — I10 Essential (primary) hypertension: Secondary | ICD-10-CM | POA: Diagnosis present

## 2021-12-30 DIAGNOSIS — G9340 Encephalopathy, unspecified: Secondary | ICD-10-CM | POA: Diagnosis present

## 2021-12-30 DIAGNOSIS — R296 Repeated falls: Secondary | ICD-10-CM | POA: Diagnosis present

## 2021-12-30 DIAGNOSIS — M25511 Pain in right shoulder: Secondary | ICD-10-CM | POA: Diagnosis not present

## 2021-12-30 DIAGNOSIS — I2510 Atherosclerotic heart disease of native coronary artery without angina pectoris: Secondary | ICD-10-CM | POA: Diagnosis present

## 2021-12-30 DIAGNOSIS — F0282 Dementia in other diseases classified elsewhere, unspecified severity, with psychotic disturbance: Secondary | ICD-10-CM | POA: Diagnosis present

## 2021-12-30 DIAGNOSIS — Z96643 Presence of artificial hip joint, bilateral: Secondary | ICD-10-CM | POA: Diagnosis present

## 2021-12-30 DIAGNOSIS — G928 Other toxic encephalopathy: Principal | ICD-10-CM | POA: Diagnosis present

## 2021-12-30 DIAGNOSIS — F32A Depression, unspecified: Secondary | ICD-10-CM | POA: Diagnosis present

## 2021-12-30 DIAGNOSIS — Z87891 Personal history of nicotine dependence: Secondary | ICD-10-CM | POA: Diagnosis not present

## 2021-12-30 DIAGNOSIS — T43225A Adverse effect of selective serotonin reuptake inhibitors, initial encounter: Secondary | ICD-10-CM | POA: Diagnosis present

## 2021-12-30 DIAGNOSIS — N400 Enlarged prostate without lower urinary tract symptoms: Secondary | ICD-10-CM | POA: Diagnosis present

## 2021-12-30 DIAGNOSIS — F419 Anxiety disorder, unspecified: Secondary | ICD-10-CM | POA: Diagnosis present

## 2021-12-30 DIAGNOSIS — J9811 Atelectasis: Secondary | ICD-10-CM | POA: Diagnosis not present

## 2021-12-30 DIAGNOSIS — R410 Disorientation, unspecified: Secondary | ICD-10-CM | POA: Diagnosis not present

## 2021-12-30 DIAGNOSIS — R531 Weakness: Secondary | ICD-10-CM | POA: Diagnosis not present

## 2021-12-30 DIAGNOSIS — E669 Obesity, unspecified: Secondary | ICD-10-CM | POA: Diagnosis not present

## 2021-12-30 DIAGNOSIS — F331 Major depressive disorder, recurrent, moderate: Secondary | ICD-10-CM | POA: Diagnosis not present

## 2021-12-30 DIAGNOSIS — R4781 Slurred speech: Secondary | ICD-10-CM | POA: Diagnosis not present

## 2021-12-30 DIAGNOSIS — Z7982 Long term (current) use of aspirin: Secondary | ICD-10-CM

## 2021-12-30 DIAGNOSIS — R9431 Abnormal electrocardiogram [ECG] [EKG]: Secondary | ICD-10-CM | POA: Diagnosis not present

## 2021-12-30 DIAGNOSIS — T428X5A Adverse effect of antiparkinsonism drugs and other central muscle-tone depressants, initial encounter: Secondary | ICD-10-CM | POA: Diagnosis present

## 2021-12-30 DIAGNOSIS — G9341 Metabolic encephalopathy: Secondary | ICD-10-CM | POA: Diagnosis not present

## 2021-12-30 DIAGNOSIS — E213 Hyperparathyroidism, unspecified: Secondary | ICD-10-CM | POA: Diagnosis present

## 2021-12-30 DIAGNOSIS — N281 Cyst of kidney, acquired: Secondary | ICD-10-CM | POA: Diagnosis not present

## 2021-12-30 DIAGNOSIS — L8990 Pressure ulcer of unspecified site, unspecified stage: Secondary | ICD-10-CM | POA: Insufficient documentation

## 2021-12-30 DIAGNOSIS — E662 Morbid (severe) obesity with alveolar hypoventilation: Secondary | ICD-10-CM | POA: Diagnosis present

## 2021-12-30 DIAGNOSIS — N401 Enlarged prostate with lower urinary tract symptoms: Secondary | ICD-10-CM

## 2021-12-30 DIAGNOSIS — J9601 Acute respiratory failure with hypoxia: Secondary | ICD-10-CM | POA: Diagnosis not present

## 2021-12-30 DIAGNOSIS — E8809 Other disorders of plasma-protein metabolism, not elsewhere classified: Secondary | ICD-10-CM | POA: Diagnosis present

## 2021-12-30 DIAGNOSIS — Z743 Need for continuous supervision: Secondary | ICD-10-CM | POA: Diagnosis not present

## 2021-12-30 DIAGNOSIS — E778 Other disorders of glycoprotein metabolism: Secondary | ICD-10-CM | POA: Diagnosis present

## 2021-12-30 DIAGNOSIS — Z955 Presence of coronary angioplasty implant and graft: Secondary | ICD-10-CM

## 2021-12-30 HISTORY — DX: Anxiety disorder, unspecified: F41.9

## 2021-12-30 HISTORY — DX: Depression, unspecified: F32.A

## 2021-12-30 LAB — COMPREHENSIVE METABOLIC PANEL
ALT: 48 U/L — ABNORMAL HIGH (ref 0–44)
AST: 30 U/L (ref 15–41)
Albumin: 4.2 g/dL (ref 3.5–5.0)
Alkaline Phosphatase: 58 U/L (ref 38–126)
Anion gap: 10 (ref 5–15)
BUN: 20 mg/dL (ref 8–23)
CO2: 25 mmol/L (ref 22–32)
Calcium: 9.6 mg/dL (ref 8.9–10.3)
Chloride: 108 mmol/L (ref 98–111)
Creatinine, Ser: 1.18 mg/dL (ref 0.61–1.24)
GFR, Estimated: >60 mL/min (ref >60)
Glucose, Bld: 98 mg/dL (ref 70–99)
Potassium: 3.7 mmol/L (ref 3.5–5.1)
Sodium: 143 mmol/L (ref 135–145)
Total Bilirubin: 1 mg/dL (ref 0.3–1.2)
Total Protein: 7.1 g/dL (ref 6.5–8.1)

## 2021-12-30 LAB — CBC
HCT: 43.6 % (ref 39.0–52.0)
Hemoglobin: 14.5 g/dL (ref 13.0–17.0)
MCH: 31.4 pg (ref 26.0–34.0)
MCHC: 33.3 g/dL (ref 30.0–36.0)
MCV: 94.4 fL (ref 80.0–100.0)
Platelets: 161 10*3/uL (ref 150–400)
RBC: 4.62 MIL/uL (ref 4.22–5.81)
RDW: 13.6 % (ref 11.5–15.5)
WBC: 6.6 10*3/uL (ref 4.0–10.5)
nRBC: 0 % (ref 0.0–0.2)

## 2021-12-30 LAB — DIFFERENTIAL
Abs Immature Granulocytes: 0.03 10*3/uL (ref 0.00–0.07)
Basophils Absolute: 0.1 10*3/uL (ref 0.0–0.1)
Basophils Relative: 1 %
Eosinophils Absolute: 0.2 10*3/uL (ref 0.0–0.5)
Eosinophils Relative: 2 %
Immature Granulocytes: 1 %
Lymphocytes Relative: 18 %
Lymphs Abs: 1.2 10*3/uL (ref 0.7–4.0)
Monocytes Absolute: 0.5 10*3/uL (ref 0.1–1.0)
Monocytes Relative: 8 %
Neutro Abs: 4.7 10*3/uL (ref 1.7–7.7)
Neutrophils Relative %: 70 %

## 2021-12-30 LAB — ETHANOL: Alcohol, Ethyl (B): <10 mg/dL (ref <10)

## 2021-12-30 LAB — APTT: aPTT: 34 seconds (ref 24–36)

## 2021-12-30 LAB — PROTIME-INR
INR: 1.1 (ref 0.8–1.2)
Prothrombin Time: 13.8 seconds (ref 11.4–15.2)

## 2021-12-30 MED ORDER — TAMSULOSIN HCL 0.4 MG PO CAPS
0.4000 mg | ORAL_CAPSULE | Freq: Every day | ORAL | Status: DC
  Administered 2021-12-31 – 2022-01-07 (×8): 0.4 mg via ORAL
  Filled 2021-12-30 (×8): qty 1

## 2021-12-30 MED ORDER — BUDESONIDE 0.5 MG/2ML IN SUSP
0.5000 mg | Freq: Two times a day (BID) | RESPIRATORY_TRACT | Status: DC
  Administered 2021-12-31 – 2022-01-07 (×15): 0.5 mg via RESPIRATORY_TRACT
  Filled 2021-12-30 (×15): qty 2

## 2021-12-30 MED ORDER — TIMOLOL MALEATE 0.5 % OP SOLN
1.0000 [drp] | Freq: Two times a day (BID) | OPHTHALMIC | Status: DC
  Administered 2022-01-01 – 2022-01-07 (×13): 1 [drp] via OPHTHALMIC
  Filled 2021-12-30 (×3): qty 5

## 2021-12-30 MED ORDER — SPIRONOLACTONE 25 MG PO TABS
25.0000 mg | ORAL_TABLET | Freq: Every day | ORAL | Status: DC
  Administered 2021-12-31: 25 mg via ORAL
  Filled 2021-12-30: qty 1

## 2021-12-30 MED ORDER — ASPIRIN 81 MG PO CHEW
81.0000 mg | CHEWABLE_TABLET | Freq: Every day | ORAL | Status: DC
  Administered 2021-12-31 – 2022-01-07 (×8): 81 mg via ORAL
  Filled 2021-12-30 (×8): qty 1

## 2021-12-30 MED ORDER — LATANOPROST 0.005 % OP SOLN
1.0000 [drp] | Freq: Every day | OPHTHALMIC | Status: DC
Start: 2021-12-30 — End: 2022-01-07
  Administered 2021-12-30 – 2022-01-06 (×8): 1 [drp] via OPHTHALMIC
  Filled 2021-12-30 (×2): qty 2.5

## 2021-12-30 MED ORDER — PRAMIPEXOLE DIHYDROCHLORIDE 0.25 MG PO TABS
0.2500 mg | ORAL_TABLET | Freq: Three times a day (TID) | ORAL | Status: DC
  Administered 2021-12-31: 0.25 mg via ORAL
  Filled 2021-12-30 (×3): qty 1

## 2021-12-30 MED ORDER — ACETAMINOPHEN 650 MG RE SUPP: 650.0000 mg | Freq: Four times a day (QID) | RECTAL | Status: DC | PRN

## 2021-12-30 MED ORDER — PRASUGREL HCL 10 MG PO TABS
10.0000 mg | ORAL_TABLET | Freq: Every day | ORAL | Status: DC
  Administered 2021-12-31: 10 mg via ORAL
  Filled 2021-12-30: qty 1

## 2021-12-30 MED ORDER — ACETAMINOPHEN 325 MG PO TABS
650.0000 mg | ORAL_TABLET | Freq: Four times a day (QID) | ORAL | Status: DC | PRN
  Administered 2021-12-31 – 2022-01-07 (×11): 650 mg via ORAL
  Filled 2021-12-30 (×11): qty 2

## 2021-12-30 MED ORDER — SODIUM CHLORIDE 0.9% FLUSH
3.0000 mL | Freq: Once | INTRAVENOUS | Status: AC
  Administered 2021-12-30: 3 mL via INTRAVENOUS
  Filled 2021-12-30: qty 3

## 2021-12-30 NOTE — ED Triage Notes (Signed)
Patient reports to the ER for altered mental status. Patient has been declining for x3 weeks but today was confused, unable to walk well, slurring his speech and having hallucinations. Patient has a spinal stimulator. Patient is hard to understand which is abnormal per family. Patient started having hallucinations and worsening gair and speech this morning around 0800

## 2021-12-30 NOTE — Telephone Encounter (Signed)
Patient's wife Eduardo Osier) was advised and states she going to take him to [facility].

## 2021-12-30 NOTE — ED Notes (Signed)
Wife and dtg have left to get some belongings at home, will return directly

## 2021-12-30 NOTE — Assessment & Plan Note (Signed)
#)   Restrictive lung disease: Documented history of such.  On fluticasone on a scheduled basis at home.  No evidence of acute exacerbation at this time.   Plan: Continue fluticasone.

## 2021-12-30 NOTE — Progress Notes (Signed)
Plan of Care Note for accepted transfer   Patient: Jeffery [name] MRN: [id number]   DOA: 12/30/2021  Facility [facility] transfer: [facility] Requesting Provider: Rhunette Croft Reason for transfer: AMS   Facility course: Patient with h/o depression/anxiety; CAD; fecal incontinence; HTN; HLD; dementia; restrictive lung disease; OSA on CPAP; back pain with spinal cord stimulator; and hyperparathyroidism presenting with AMS.  Aggressive neurocognitive decline for 2 weeks, started when on a family trip to [place].  Came home and behavior has continued - urinating in the closet, visual hallucinations, gait disturbance with falls.  They saw PCP and tried to make neuro appointment and neuro sent to ER due to concerns over the phone.  Sister with [facility]; neurology is concerned about prior disease.  Neuro wants MRI and probable LP.    Plan of care: The patient is accepted for admission to Telemetry unit, at [facility].   Author: Jonah Blue, MD 12/30/2021  Check [email or web address] for on-call coverage.  Nursing staff, Please call [facility] Admits & Consults System-Wide number on Amion as soon as patient's arrival, so appropriate admitting provider can evaluate the pt.

## 2021-12-30 NOTE — ED Notes (Signed)
Slight facil droop to right side of mouth. Pupils pinpoint and no peripheral vision

## 2021-12-30 NOTE — Assessment & Plan Note (Signed)
 #)   Acute encephalopathy: Relative to the patient's baseline dementia, family conveys that the patient has exhibited 2 weeks of progressive confusion, behavior changes, visual hallucinations, and new onset gait disturbance resulting in multiple ground-level falls.  Unclear etiology at this time.  No evidence of underlying infectious process, including urinalysis performed as an outpatient on 12/26/2021, which was inconsistent with urinary tract infection.  Notable in the context of documented history of BPH on tamsulosin as an outpatient.  We will closely monitor for evidence of urinary retention that could be contributing to an element of confusion/agitation.  No overt acute respiratory symptoms at this time, but will pursue chest x-ray to further evaluate for any underlying infectious contribution.  No overt metabolic contributing factors at this time, but will pursue further evaluation thereof as further detailed below.  In the context of documented history of obstructive sleep apnea, also check blood gas, evaluating for any contribution from hypercapnic encephalopathy.  Considered potential contribution from polypharmacy as well as several central acting medications on home med list, including Wellbutrin, Lexapro, prn Flexeril, Mirapex.  However, these appear to represent baseline medications without any recent dose adjustments, and that these medications and current doses precede the more recent development of progressive confusion over the last 2 weeks.  Will closely monitor for results of formal inpatient pharmacist reconciliation to further evaluate. Will hold central-acting home medications to allow a washout period in order to reduce this potential confounding vs contributory pharmacologic variable.  No overt evidence of seizures, no such history.  No overt acute focal neurologic deficits at this time, while today CTh is no evidence of acute intracranial process, will demonstrating interval  progression of moderate atrophy and chronic microvascular ischemic changes relative to MRI brain in September 2021 as well as CT head in March 2021, as further detailed above.  EDP at [facility] discussed the patient's case with the on-call neurologist, Dr. Viviann Spare, Who recommended admission to the hospitalist service at [facility] for further evaluation of the patient's altered mental status, and conveyed that neurology would formally consult, with preliminary recommendation for pursuit of MRI brain.  Per these discussions, and depending upon ensuing results MRI brain, neurology may also ultimately recommend pursuit of lumbar puncture.  Will keep patient NPO until mental status improves sufficiently that patient is able to participate in and pass nursing bedside swallow screen.      Plan: fall precautions. Repeat CMP/CBC in the AM. Check magnesium level. check VBG, TSH. NPO pending nursing bedside swallow evaluation prior to the initiation of a diet/oral medications, as described above.  hold central-acting home medications form of Lexapro, Wellbutrin, prn Flexeril, to allow a washout period in order to reduce this potential confounding vs contributory pharmacologic variable.  Neurology consulted.  MRI brain.  Chest x-ray.  Check urinary drug screen, CPK level.  Check ammonia level.  Every 4 hours neurochecks ordered.  Follow-up for result of inpatient pharmacy reconciliation of home meds.

## 2021-12-30 NOTE — Plan of Care (Signed)
Telephone conversation with the ER physician  Patient with acute change in mental status since June with visual hallucinations, gait changes, abnormal behavior such as urinating in the closet and slurred speech.  Primary care referred him to the ER for urgent work-up which I think is warranted to evaluate for movement disorder with psychiatric component or CVA versus prion disease given acute onset of symptoms.  transfer to [facility] for further work-up and inpatient neurology evaluation

## 2021-12-30 NOTE — Assessment & Plan Note (Signed)
 #)   Essential Hypertension: documented h/o such, with outpatient antihypertensive regimen including Lasix, spironolactone, Norvasc, also noting potential antihypertensive implications of outpatient tamsulosin..  SBP's in the ED today: Normotensive.  We will be conservative and resumption of home antihypertensive medications, as further detailed below.  Plan: Close monitoring of subsequent BP via routine VS. continue home spironolactone as well as tamsulosin, will hold home Norvasc and Lasix for now.  Monitor strict I's and O's and weights.

## 2021-12-30 NOTE — ED Notes (Signed)
Assisted pt and wife with urinal, pt can not stand alone

## 2021-12-30 NOTE — Telephone Encounter (Signed)
Patient's wife Eduardo Osier) called stating he is very confused and not himself. She states patient saying that he see bugs all on the wall/ floors and making false states about grandchildren. Wife is very concerned and would like to know if she should take patient to the hospital to get evaluated.

## 2021-12-30 NOTE — Progress Notes (Signed)
[facility] ED[facility]) Hospital Liaison note:  This is a pending outpatient-based Palliative Care patient. Will continue to follow for disposition.  Please call with any outpatient palliative questions or concerns.  Thank you, Abran Cantor, LPN [facility] Liaison [phone]

## 2021-12-30 NOTE — ED Notes (Signed)
Visible tremor in hand when hold cup for stroke swallow screen. Pt was able to manage without assitance but family stated the shaking while hold in the cup is more pronounced

## 2021-12-30 NOTE — Telephone Encounter (Signed)
Yes I recommend evaluation.

## 2021-12-30 NOTE — Assessment & Plan Note (Signed)
  #)   Depression: Documented history of such, on Wellbutrin as well as Lexapro as an outpatient.  In the setting of his presenting acute encephalopathy, will hold these outpatient central acting medications for now, as further detailed above.  Of note, presenting EKG is not associate any evidence of QTc prolongation.  Plan: Hold home Wellbutrin and Lexapro for now, as above.

## 2021-12-30 NOTE — ED Provider Notes (Signed)
[facility] EMERGENCY DEPT Provider Note   CSN: [id number] Arrival date & time: 12/30/21  1257     History  Chief Complaint  Patient presents with   Altered Mental Status    Rydan Gulyas is a 86 y.o. male.  HPI    86 year old male brought into the ER with chief complaint of acute mental status change.  Patient brought here by his wife and his daughter.  Patient has history of some memory impairment, but no dementia, hypertension, hyperlipidemia, depression.   According to the family, in the middle of June they went to [place] to visit patient's son.  While there, patient was disoriented.  He started having sleep disturbance and would wake up in the middle the night, questioning why things look different.  They chalked the change to change in scenery.  Once they got home however, patient continues to have cognitive decline.  Patient continues to wake up in the middle the night often.  He is also hallucinating and seeing things like son cleaning jars, mice in the house, cockroaches in the house.  In the middle the night he is found to be trying to clean the knife, when there is no knife in the bed.  Moreover, family has noted that patient has been having worsening gait.  He shuffles now when he walks and has had increased balance issues.  Patient has had 2 falls in the last week.  Review of system is negative for any UTI-like symptoms, fevers, chills, cough.  Patient is not on any new medications.  No previous history of similar symptoms. There is family history of a sister having Nicholes Mango disease.   Home Medications Prior to Admission medications   Medication Sig Start Date End Date Taking? Authorizing Provider  amLODipine (NORVASC) 10 MG tablet TAKE 1 TABLET EVERY DAY 05/06/21   Frederica Kuster, MD  aspirin 81 MG tablet Take 81 mg by mouth in the morning and at bedtime.    [provider]  buPROPion (WELLBUTRIN XL) 300 MG 24 hr tablet Take 300  mg by mouth daily. 11/26/21   [provider]  butalbital-acetaminophen-caffeine (FIORICET) 50-325-40 MG tablet Take Two tablet in the morning and take two tablets in the evening. 09/24/21   Frederica Kuster, MD  Calcium Carbonate Antacid (TUMS PO) Take by mouth.    [provider]  Cholecalciferol (VITAMIN D) 2000 units CAPS Take 2,000 Units by mouth daily.    [provider]  colestipol (COLESTID) 1 g tablet Take 1 tablet (1 g total) by mouth 2 (two) times daily. 09/28/20   Ngetich, Dinah C, NP  cyclobenzaprine (FLEXERIL) 10 MG tablet Take 1 tablet (10 mg total) by mouth 3 (three) times daily as needed for muscle spasms. 09/24/21   Frederica Kuster, MD  escitalopram (LEXAPRO) 10 MG tablet Take 20 mg by mouth daily.    [provider]  Fluticasone Furoate 200 MCG/ACT AEPB Inhale 1 puff into the lungs daily at 12 noon. 08/14/21   Frederica Kuster, MD  furosemide (LASIX) 20 MG tablet Take 20 mg by mouth daily.     [provider]  latanoprost (XALATAN) 0.005 % ophthalmic solution Place 1 drop into both eyes at bedtime. 03/24/16   [provider]  magnesium oxide (MAG-OX) 400 MG tablet Take 400 mg by mouth daily.    [provider]  nitroGLYCERIN (MINITRAN) 0.2 mg/hr patch Place 1 patch (0.2 mg total) onto the skin daily. Use I/4 patch q  24 hrs 05/20/21   Frederica Kuster, MD  nitroGLYCERIN (NITRODUR - DOSED IN MG/24 HR) 0.2 mg/hr patch Place 0.2 mg onto the skin daily.    [provider]  omeprazole (PRILOSEC) 20 MG capsule TAKE 1 CAPSULE ONE TIME DAILY AS NEEDED FOR ACID REFLUX 10/14/21   Frederica Kuster, MD  potassium chloride SA (KLOR-CON M) 20 MEQ tablet TAKE 1 TABLET EVERY DAY 07/10/21   Frederica Kuster, MD  pramipexole (MIRAPEX) 0.25 MG tablet Take by mouth. 11/26/21   [provider]  prasugrel (EFFIENT) 10 MG TABS tablet Take 10 mg by mouth daily.    [provider]  Probiotic Product (PROBIOTIC PO) Take 1  capsule by mouth daily.    [provider]  spironolactone (ALDACTONE) 25 MG tablet Take 25 mg by mouth daily.    [provider]  tamsulosin (FLOMAX) 0.4 MG CAPS capsule TAKE 1 CAPSULE BY MOUTH ONCE DAILY WITH SUPPER 12/11/21   Frederica Kuster, MD  terazosin (HYTRIN) 5 MG capsule Take 1 capsule (5 mg total) by mouth every evening. 10/05/20   Ngetich, Dinah C, NP  timolol (TIMOPTIC) 0.5 % ophthalmic solution Place 1 drop into both eyes 2 (two) times daily. 03/24/16   [provider]  vitamin B-12 (CYANOCOBALAMIN) 1000 MCG tablet Take 1,000 mcg by mouth daily.    [provider]      Allergies    Patient has no known allergies.    Review of Systems   Review of Systems  All other systems reviewed and are negative.   Physical Exam Updated Vital Signs BP (!) 124/97   Pulse 63   Temp 98.4 F (36.9 C) (Oral)   Resp 20   Ht 5\' 10"  (1.778 m)   Wt 114 kg   SpO2 93%   BMI 36.06 kg/m  Physical Exam Vitals and nursing note reviewed.  Constitutional:      Appearance: He is well-developed.  HENT:     Head: Atraumatic.  Eyes:     Extraocular Movements: Extraocular movements intact.     Pupils: Pupils are equal, round, and reactive to light.  Cardiovascular:     Rate and Rhythm: Normal rate.  Pulmonary:     Effort: Pulmonary effort is normal.  Musculoskeletal:     Cervical back: Neck supple.  Skin:    General: Skin is warm.  Neurological:     Mental Status: He is alert and oriented to person, place, and time.     Cranial Nerves: No cranial nerve deficit.     Sensory: No sensory deficit.     Motor: No weakness.     Coordination: Coordination normal.     Gait: Gait abnormal.     ED Results / Procedures / Treatments   Labs (all labs ordered are listed, but only abnormal results are displayed) Labs Reviewed  COMPREHENSIVE METABOLIC PANEL - Abnormal; Notable for the following components:      Result Value   ALT 48 (*)    All other components  within normal limits  PROTIME-INR  APTT  CBC  DIFFERENTIAL  ETHANOL    EKG None  Radiology CT HEAD WO CONTRAST  Result Date: 12/30/2021 CLINICAL DATA:  Altered mental status with slurred speech and hallucinations. EXAM: CT HEAD WITHOUT CONTRAST TECHNIQUE: Contiguous axial images were obtained from the base of the skull through the vertex without intravenous contrast. RADIATION DOSE REDUCTION: This exam was performed according to the departmental dose-optimization program which includes automated exposure  control, adjustment of the mA and/or kV according to patient size and/or use of iterative reconstruction technique. COMPARISON:  MRI brain dated March 23, 2020. CT head dated September 17, 2019. FINDINGS: Brain: No evidence of acute infarction, hemorrhage, hydrocephalus, extra-axial collection or mass lesion/mass effect. Progressive moderate atrophy and chronic microvascular ischemic changes. Vascular: Atherosclerotic vascular calcification of the carotid siphons. No hyperdense vessel. Unchanged dolichoectasia of the basilar artery. Skull: Normal. Negative for fracture or focal lesion. Sinuses/Orbits: No acute finding. Other: None. IMPRESSION: 1. No acute intracranial abnormality. 2. Progressive moderate atrophy and chronic microvascular ischemic changes. Electronically Signed   By: Obie Dredge M.D.   On: 12/30/2021 14:18    Procedures Procedures    Medications Ordered in ED Medications  sodium chloride flush (NS) 0.9 % injection 3 mL (has no administration in time range)    ED Course/ Medical Decision Making/ A&P                           Medical Decision Making Amount and/or Complexity of Data Reviewed Labs: ordered. Radiology: ordered.  Risk Decision regarding hospitalization.   This patient presents to the ED with chief complaint(s) of worsening mental status with pertinent past medical history of hypertension, hyperlipidemia, memory impairment which further complicates  the presenting complaint. The complaint involves an extensive differential diagnosis and also carries with it a high risk of complications and morbidity.    It appears that patient had mild memory impairment but no dementia.  More recently he has become disoriented, has poor gait, and new visual hallucinations.  He is not on any new medications.  History is not suggestive of infection.  Patient has no cancer history.  It appears that patient saw his PCP.  They recommended neuro follow-up.  However neurology cannot see him anytime soon and advised that patient come to the ER given the new changes.  The differential diagnosis includes severe electrolyte abnormality including hyponatremia, medication side effect, Parkinson's or parkinsonian disease, vascular dementia, prion disease.  The initial plan is to get basic blood work-up and CT scan of the brain.   Additional history obtained: Additional history obtained from patient's daughter and wife Records reviewed Primary Care Documents  Independent labs interpretation:  The following labs were independently interpreted: Lab work-up is completely reassuring.  CBC, metabolic profile are normal.  Independent visualization of imaging: - I independently visualized the following imaging with scope of interpretation limited to determining acute life threatening conditions related to emergency care: Ct brain, which revealed no evidence of brain bleed.  Consultation: - Consulted or discussed management/test interpretation w/ external professional: Discussed case with neurology.  Dr. Octavio Graves recommends that patient will need admission to the hospital for further work-up for rapid change in his mental status.  Treatment and Reassessment: Medicine consulted for admission.  Patient and family made aware of the plan to admit.   Final Clinical Impression(s) / ED Diagnoses Final diagnoses:  Acute alteration in mental status    Rx / DC Orders ED  Discharge Orders     None         Derwood Kaplan, MD 12/30/21 1525

## 2021-12-30 NOTE — Assessment & Plan Note (Signed)
  #)   Benign Prostatic Hyperplasia:  documented h/o such; on tamsulosin as outpatient.  Notable in the setting of the patient's progressive confusion over the last couple weeks, although urinalysis a few days ago as an outpatient was inconsistent with UTI.  We will closely monitor ensuing strict I's and O's and evaluating for element of urinary retention that could be contributing to his confusion/agitation.    Plan: monitor strict I's & O's and daily weights. Repeat BMP in AM.  continue home tamsulosin.

## 2021-12-30 NOTE — H&P (Signed)
History and Physical    PLEASE NOTE THAT DRAGON DICTATION SOFTWARE WAS USED IN THE CONSTRUCTION OF THIS NOTE.   Jeffery [name] ZOX:[id number] DOB: Mar 05, 1936 DOA: 12/30/2021  PCP: Jeffery Kuster, MD  Patient coming from: home   I have personally briefly reviewed patient's old medical records in [facility]  Chief Complaint: Altered mental status  HPI: Jeffery [name] is a 86 y.o. male with medical history significant for dementia, depression, coronary disease status post PCI with stent placement in February 2021, essential hypertension, restrictive lung disease, chronic back pain status post spinal cord stimulator, BPH, who is admitted to [facility] on 12/30/2021 by way of transfer from [facility] emergency department with acute encephalopathy superimposed on dementia after presenting from home to the latter for evaluation of altered mental status.  In the setting of the patient's altered mental status, the following history is provided by the patient's family, [facility], and via chart review.  Family reports that over the last 2 weeks, the patient has appeared confused relative to the patient's baseline mental status in which he carries dx of dementia.  They also conveyed that the patient has been exhibiting behaviors that would otherwise be out of character for the patient and not typical of his daily routines, noting that he has been urinating in his closet at home relative to his baseline urinary habits in which he typically uses the commode.  He also has been reporting seeing things that are not there, which also represents a new feature for the patient.  Family is also noted the patient exhibited evidence that he is not appears steady on his feet over the last 1 to 2 weeks relative to baseline.  As a consequence of their perception of gait disturbance, they have noted the patient to Exhibit 2 ground-level falls, 1 of which was witnessed.  No report of loss of consciousness  today denies that the patient hit his head as a component of either these falls.  Family also conveys that, at baseline, the patient is able to ambulate without assistance, and that these falls are unusual for him.  Altered mental status was first noted prior to experiencing the aforementioned falls. Patient was first noted to be exhibiting the above evidence of confusion while on a trip with his family to the state of [place].  While family had hoped that this confusion may be delirium as a result of being in a new location, they conveyed that the patient's confusion relative to baseline has persisted even after returning home from vacation.   Over the last 2 weeks, no reported associated fever.  No associated any nausea, vomiting, diarrhea, rash.  No recent change in outpatient medications.  No history of seizures.   For evaluation of the patient's altered mental status, he underwent urinalysis as an outpatient on 12/26/2021, which was notable for showing no white blood cells, no bacteria, and was positive for hyaline casts.  He has a history notable for depression on Wellbutrin and Lexapro he also has a history of chronic back pain status post spinal cord stimulator, with outpatient medications notable for as needed Flexeril.  Family history also reported to be notable for Creutzfeld-Jacob syndrome in the patient's sister.  In the setting of the aforementioned progressive confusion relative to baseline mental status, the patient was brought to [facility] emergency department today for further evaluation and management thereof.    [facility] ED Course:  Vital signs in the ED were notable for the following: Afebrile;  heart rate 62-83; blood pressure 113/66 - 127/90; respiratory rate 15-22, oxygen saturation 96 to 96% on room air.  Labs were notable for the following: CMP notable for the following: Sodium 143, bicarbonate 25, creatinine 1.18 compared to most recent prior serum creatinine data point  of 1.33 on 12/26/2021, glucose 98, ALT 48, otherwise, liver enzymes were found to be within normal limits.  Serum ethanol level less than 10.  CBC notable for white cell count 6600, hemoglobin 14.5.  INR 1.1.  Imaging and additional notable ED work-up: EKG shows sinus rhythm with PAC, heart rate 67, normal intervals, no evidence of T wave or ST changes, including no evidence of ST elevation.  Noncontrast CT head, in comparison MRI brain from September 2021 as well as CT head from March thousand 21 showed no evidence of acute intracranial process, including no evidence of intracranial hemorrhage or any evidence of acute infarct, while also showing interval progression of moderate atrophy and chronic microvascular ischemic changes.  EDP discussed the patient's case with the on-call neurologist, Dr. Viviann [name], Who recommended admission to the hospitalist service at [facility] for further evaluation of the patient's altered mental status, and conveyed that neurology would formally consult, with preliminary recommendation for pursuit of MRI brain.   While in the ED, the following were administered: (No medications or IV fluids were administered while [facility] ED).  Subsequently, the patient was transferred to [facility] for admission for acute encephalopathy of unclear source.     Review of Systems: As per HPI otherwise 10 point review of systems negative.   Past Medical History:  Diagnosis Date   Anxiety and depression    BPH (benign prostatic hyperplasia)    Coronary artery disease    Fecal incontinence    GERD (gastroesophageal reflux disease)    Glaucoma    Hard of hearing    Headache    History of pneumonia    Hyperlipidemia    Hypertension    Memory impairment    Migraine headache    Neck fracture (HCC)    2-3/2-21 followed by DR Jeffery [name] in [place]    Nocturia    Osteoarthritis    Restrictive lung disease    Sleep apnea    wears CPAP does not know setting    Thyroid disease     Hyperparathyroidism    Urinary frequency    Urinary incontinence    Wears glasses     Past Surgical History:  Procedure Laterality Date   CARDIAC CATHETERIZATION     CIRCUMCISION     COLONOSCOPY     CORONARY ANGIOPLASTY     stents- 08/2019    ESOPHAGOGASTRODUODENOSCOPY     EYE SURGERY Bilateral    cataract   HAND SURGERY Right    HIP ARTHROPLASTY Right    HIP ARTHROPLASTY Left 2006   KNEE CARTILAGE SURGERY Bilateral    LUMBAR LAMINECTOMY     L3 - L4   RETINAL DETACHMENT SURGERY Right    x2   REVERSE SHOULDER ARTHROPLASTY Right 05/29/2016   Procedure: REVERSE SHOULDER ARTHROPLASTY;  Surgeon: Francena Hanly, MD;  Location: [facility] OR;  Service: Orthopedics;  Laterality: Right;   SHOULDER SURGERY Bilateral    TOTAL HIP REVISION Right 04/04/2020   Procedure: Right hip acetabular liner revision;  Surgeon: [name], MD;  Location: [facility] ORS;  Service: Orthopedics;  Laterality: Right;     Social History:  reports that he has quit smoking. He has been exposed to tobacco smoke. He has  never used smokeless tobacco. He reports current alcohol use. He reports that he does not use drugs.   No Known Allergies  History reviewed. No pertinent family history.  Family history reviewed and not pertinent    Prior to Admission medications   Medication Sig Start Date End Date Taking? Authorizing Provider  amLODipine (NORVASC) 10 MG tablet TAKE 1 TABLET EVERY DAY 05/06/21   Jeffery Kuster, MD  aspirin 81 MG tablet Take 81 mg by mouth in the morning and at bedtime.    [provider]  buPROPion (WELLBUTRIN XL) 300 MG 24 hr tablet Take 300 mg by mouth daily. 11/26/21   [provider]  butalbital-acetaminophen-caffeine (FIORICET) 50-325-40 MG tablet Take Two tablet in the morning and take two tablets in the evening. 09/24/21   Jeffery Kuster, MD  Calcium Carbonate Antacid (TUMS PO) Take by mouth.    [provider]  Cholecalciferol (VITAMIN D) 2000 units CAPS  Take 2,000 Units by mouth daily.    [provider]  colestipol (COLESTID) 1 g tablet Take 1 tablet (1 g total) by mouth 2 (two) times daily. 09/28/20   Ngetich, Dinah C, NP  cyclobenzaprine (FLEXERIL) 10 MG tablet Take 1 tablet (10 mg total) by mouth 3 (three) times daily as needed for muscle spasms. 09/24/21   Jeffery Kuster, MD  escitalopram (LEXAPRO) 10 MG tablet Take 20 mg by mouth daily.    [provider]  Fluticasone Furoate 200 MCG/ACT AEPB Inhale 1 puff into the lungs daily at 12 noon. 08/14/21   Jeffery Kuster, MD  furosemide (LASIX) 20 MG tablet Take 20 mg by mouth daily.     [provider]  latanoprost (XALATAN) 0.005 % ophthalmic solution Place 1 drop into both eyes at bedtime. 03/24/16   [provider]  magnesium oxide (MAG-OX) 400 MG tablet Take 400 mg by mouth daily.    [provider]  nitroGLYCERIN (MINITRAN) 0.2 mg/hr patch Place 1 patch (0.2 mg total) onto the skin daily. Use I/4 patch q 24 hrs 05/20/21   Jeffery Kuster, MD  nitroGLYCERIN (NITRODUR - DOSED IN MG/24 HR) 0.2 mg/hr patch Place 0.2 mg onto the skin daily.    [provider]  omeprazole (PRILOSEC) 20 MG capsule TAKE 1 CAPSULE ONE TIME DAILY AS NEEDED FOR ACID REFLUX 10/14/21   Jeffery Kuster, MD  potassium chloride SA (KLOR-CON M) 20 MEQ tablet TAKE 1 TABLET EVERY DAY 07/10/21   Jeffery Kuster, MD  pramipexole (MIRAPEX) 0.25 MG tablet Take by mouth. 11/26/21   [provider]  prasugrel (EFFIENT) 10 MG TABS tablet Take 10 mg by mouth daily.    [provider]  Probiotic Product (PROBIOTIC PO) Take 1 capsule by mouth daily.    [provider]  spironolactone (ALDACTONE) 25 MG tablet Take 25 mg by mouth daily.    [provider]  tamsulosin (FLOMAX) 0.4 MG CAPS capsule TAKE 1 CAPSULE BY MOUTH ONCE DAILY WITH SUPPER 12/11/21   Jeffery Kuster, MD  terazosin (HYTRIN) 5 MG capsule Take 1 capsule (5 mg total) by mouth every  evening. 10/05/20   Ngetich, Dinah C, NP  timolol (TIMOPTIC) 0.5 % ophthalmic solution Place 1 drop into both eyes 2 (two) times daily. 03/24/16   [provider]  vitamin B-12 (CYANOCOBALAMIN) 1000 MCG tablet Take 1,000 mcg by mouth daily.    [provider]     Objective    Physical Exam: Vitals:  12/30/21 1800 12/30/21 1845 12/30/21 1930 12/30/21 2034  BP: 123/87 127/90 (!) 123/97 (!) 131/101  Pulse:   83 73  Resp: 19 17 (!) 22 19  Temp:   98.2 F (36.8 C) 98 F (36.7 C)  TempSrc:   Oral   SpO2: 97% 98% 96% 96%  Weight:      Height:        General: appears to be stated age; alert, confused Skin: warm, dry, no rash Head:  AT/[place] Mouth:  Oral mucosa membranes appear moist, normal dentition Neck: supple; trachea midline Heart:  RRR; did not appreciate any M/R/G Lungs: CTAB, did not appreciate any wheezes, rales, or rhonchi Abdomen: + BS; soft, ND, NT Vascular: 2+ pedal pulses b/l; 2+ radial pulses b/l Extremities: no peripheral edema, no muscle wasting Neuro: In the setting of the patient's current mental status and associated inability to follow instructions, unable to perform full neurologic exam at this time.  As such, assessment of strength, sensation, and cranial nerves is limited at this time. Patient noted to spontaneously move all 4 extremities. No tremors.      Labs on Admission: I have personally reviewed following labs and imaging studies  CBC: Recent Labs  Lab 12/30/21 1316  WBC 6.6  NEUTROABS 4.7  HGB 14.5  HCT 43.6  MCV 94.4  PLT 161   Basic Metabolic Panel: Recent Labs  Lab 12/26/21 1138 12/30/21 1316  NA 139 143  K 4.0 3.7  CL 104 108  CO2 24 25  GLUCOSE 92 98  BUN 14 20  CREATININE 1.33* 1.18  CALCIUM 9.7 9.6   GFR: Estimated Creatinine Clearance: 60 mL/min (by C-G formula based on SCr of 1.18 mg/dL). Liver Function Tests: Recent Labs  Lab 12/30/21 1316  AST 30  ALT 48*  ALKPHOS 58  BILITOT 1.0  PROT 7.1   ALBUMIN 4.2   No results for input(s): "LIPASE", "AMYLASE" in the last 168 hours. No results for input(s): "AMMONIA" in the last 168 hours. Coagulation Profile: Recent Labs  Lab 12/30/21 1316  INR 1.1   Cardiac Enzymes: No results for input(s): "CKTOTAL", "CKMB", "CKMBINDEX", "TROPONINI" in the last 168 hours. BNP (last 3 results) No results for input(s): "PROBNP" in the last 8760 hours. HbA1C: No results for input(s): "HGBA1C" in the last 72 hours. CBG: No results for input(s): "GLUCAP" in the last 168 hours. Lipid Profile: No results for input(s): "CHOL", "HDL", "LDLCALC", "TRIG", "CHOLHDL", "LDLDIRECT" in the last 72 hours. Thyroid Function Tests: No results for input(s): "TSH", "T4TOTAL", "FREET4", "T3FREE", "THYROIDAB" in the last 72 hours. Anemia Panel: No results for input(s): "VITAMINB12", "FOLATE", "FERRITIN", "TIBC", "IRON", "RETICCTPCT" in the last 72 hours. Urine analysis:    Component Value Date/Time   COLORURINE YELLOW 12/26/2021 1138   APPEARANCEUR CLEAR 12/26/2021 1138   LABSPEC 1.014 12/26/2021 1138   PHURINE 6.5 12/26/2021 1138   GLUCOSEU NEGATIVE 12/26/2021 1138   HGBUR NEGATIVE 12/26/2021 1138   BILIRUBINUR NEGATIVE 04/09/2009 0853   KETONESUR NEGATIVE 12/26/2021 1138   PROTEINUR 1+ (A) 12/26/2021 1138   UROBILINOGEN 0.2 04/09/2009 0853   NITRITE NEGATIVE 12/26/2021 1138   LEUKOCYTESUR NEGATIVE 12/26/2021 1138    Radiological Exams on Admission: CT HEAD WO CONTRAST  Result Date: 12/30/2021 CLINICAL DATA:  Altered mental status with slurred speech and hallucinations. EXAM: CT HEAD WITHOUT CONTRAST TECHNIQUE: Contiguous axial images were obtained from the base of the skull through the vertex without intravenous contrast. RADIATION DOSE REDUCTION: This exam was performed according to the departmental dose-optimization  program which includes automated exposure control, adjustment of the mA and/or kV according to patient size and/or use of iterative  reconstruction technique. COMPARISON:  MRI brain dated March 23, 2020. CT head dated September 17, 2019. FINDINGS: Brain: No evidence of acute infarction, hemorrhage, hydrocephalus, extra-axial collection or mass lesion/mass effect. Progressive moderate atrophy and chronic microvascular ischemic changes. Vascular: Atherosclerotic vascular calcification of the carotid siphons. No hyperdense vessel. Unchanged dolichoectasia of the basilar artery. Skull: Normal. Negative for fracture or focal lesion. Sinuses/Orbits: No acute finding. Other: None. IMPRESSION: 1. No acute intracranial abnormality. 2. Progressive moderate atrophy and chronic microvascular ischemic changes. Electronically Signed   By: Obie Dredge M.D.   On: 12/30/2021 14:18     EKG: Independently reviewed, with result as described above.    Assessment/Plan   Principal Problem:   Acute encephalopathy Active Problems:   Primary hypertension   Restrictive lung disease   BPH (benign prostatic hyperplasia)   Depression      #) Acute encephalopathy: Relative to the patient's baseline dementia, family conveys that the patient has exhibited 2 weeks of progressive confusion, behavior changes, visual hallucinations, and new onset gait disturbance resulting in multiple ground-level falls.  Unclear etiology at this time.  No evidence of underlying infectious process, including urinalysis performed as an outpatient on 12/26/2021, which was inconsistent with urinary tract infection.  Notable in the context of documented history of BPH on tamsulosin as an outpatient.  We will closely monitor for evidence of urinary retention that could be contributing to an element of confusion/agitation.  No overt acute respiratory symptoms at this time, but will pursue chest x-ray to further evaluate for any underlying infectious contribution.  No overt metabolic contributing factors at this time, but will pursue further evaluation thereof as further detailed  below.  In the context of documented history of obstructive sleep apnea, also check blood gas, evaluating for any contribution from hypercapnic encephalopathy.  Considered potential contribution from polypharmacy as well as several central acting medications on home med list, including Wellbutrin, Lexapro, prn Flexeril, Mirapex.  However, these appear to represent baseline medications without any recent dose adjustments, and that these medications and current doses precede the more recent development of progressive confusion over the last 2 weeks.  Will closely monitor for results of formal inpatient pharmacist reconciliation to further evaluate. Will hold central-acting home medications to allow a washout period in order to reduce this potential confounding vs contributory pharmacologic variable.  No overt evidence of seizures, no such history.  No overt acute focal neurologic deficits at this time, while today CTh is no evidence of acute intracranial process, will demonstrating interval progression of moderate atrophy and chronic microvascular ischemic changes relative to MRI brain in September 2021 as well as CT head in March 2021, as further detailed above.  EDP at [facility] discussed the patient's case with the on-call neurologist, Dr. Viviann [name], Who recommended admission to the hospitalist service at [facility] for further evaluation of the patient's altered mental status, and conveyed that neurology would formally consult, with preliminary recommendation for pursuit of MRI brain.  Per these discussions, and depending upon ensuing results MRI brain, neurology may also ultimately recommend pursuit of lumbar puncture.  Will keep patient NPO until mental status improves sufficiently that patient is able to participate in and pass nursing bedside swallow screen.      Plan: fall precautions. Repeat CMP/CBC in the AM. Check magnesium level. check VBG, TSH. NPO pending nursing bedside swallow evaluation prior to the  initiation of a diet/oral medications, as described above.  hold central-acting home medications form of Lexapro, Wellbutrin, prn Flexeril, to allow a washout period in order to reduce this potential confounding vs contributory pharmacologic variable.  Neurology consulted.  MRI brain.  Chest x-ray.  Check urinary drug screen, CPK level.  Check ammonia level.  Every 4 hours neurochecks ordered.  Follow-up for result of inpatient pharmacy reconciliation of home meds.         #) Depression: Documented history of such, on Wellbutrin as well as Lexapro as an outpatient.  In the setting of his presenting acute encephalopathy, will hold these outpatient central acting medications for now, as further detailed above.  Of note, presenting EKG is not associate any evidence of QTc prolongation.  Plan: Hold home Wellbutrin and Lexapro for now, as above.            #) Essential Hypertension: documented h/o such, with outpatient antihypertensive regimen including Lasix, spironolactone, Norvasc, also noting potential antihypertensive implications of outpatient tamsulosin..  SBP's in the ED today: Normotensive.  We will be conservative and resumption of home antihypertensive medications, as further detailed below.  Plan: Close monitoring of subsequent BP via routine VS. continue home spironolactone as well as tamsulosin, will hold home Norvasc and Lasix for now.  Monitor strict I's and O's and weights.         #) Restrictive lung disease: Documented history of such.  On fluticasone on a scheduled basis at home.  No evidence of acute exacerbation at this time.   Plan: Continue fluticasone.          #) Benign Prostatic Hyperplasia:  documented h/o such; on tamsulosin as outpatient.  Notable in the setting of the patient's progressive confusion over the last couple weeks, although urinalysis a few days ago as an outpatient was inconsistent with UTI.  We will closely monitor ensuing strict  I's and O's and evaluating for element of urinary retention that could be contributing to his confusion/agitation.    Plan: monitor strict I's & O's and daily weights. Repeat BMP in AM.  continue home tamsulosin.       DVT prophylaxis: SCD's   Code Status: Full code Disposition Plan: Per Rounding Team Consults called: on-call neurologist, Dr. Viviann [name], As further detailed above. Admission status: Inpatient   PLEASE NOTE THAT DRAGON DICTATION SOFTWARE WAS USED IN THE CONSTRUCTION OF THIS NOTE.   Chaney Born Sima Lindenberger DO [facility]  From 7PM - 7AM   12/30/2021, 9:52 PM

## 2021-12-31 ENCOUNTER — Inpatient Hospital Stay (HOSPITAL_COMMUNITY): Payer: Medicare HMO

## 2021-12-31 DIAGNOSIS — L8990 Pressure ulcer of unspecified site, unspecified stage: Secondary | ICD-10-CM | POA: Insufficient documentation

## 2021-12-31 DIAGNOSIS — G9340 Encephalopathy, unspecified: Secondary | ICD-10-CM | POA: Diagnosis not present

## 2021-12-31 LAB — BLOOD GAS, VENOUS
Acid-Base Excess: 2.4 mmol/L — ABNORMAL HIGH (ref 0.0–2.0)
Bicarbonate: 27.2 mmol/L (ref 20.0–28.0)
Drawn by: 13791
O2 Saturation: 57.3 %
Patient temperature: 36.7
pCO2, Ven: 41 mmHg — ABNORMAL LOW (ref 44–60)
pH, Ven: 7.42 (ref 7.25–7.43)
pO2, Ven: 36 mmHg (ref 32–45)

## 2021-12-31 LAB — CBC WITH DIFFERENTIAL/PLATELET
Abs Immature Granulocytes: 0.05 10*3/uL (ref 0.00–0.07)
Basophils Absolute: 0.1 10*3/uL (ref 0.0–0.1)
Basophils Relative: 1 %
Eosinophils Absolute: 0.1 10*3/uL (ref 0.0–0.5)
Eosinophils Relative: 2 %
HCT: 43.9 % (ref 39.0–52.0)
Hemoglobin: 14.8 g/dL (ref 13.0–17.0)
Immature Granulocytes: 1 %
Lymphocytes Relative: 19 %
Lymphs Abs: 1.3 10*3/uL (ref 0.7–4.0)
MCH: 32 pg (ref 26.0–34.0)
MCHC: 33.7 g/dL (ref 30.0–36.0)
MCV: 95 fL (ref 80.0–100.0)
Monocytes Absolute: 0.7 10*3/uL (ref 0.1–1.0)
Monocytes Relative: 9 %
Neutro Abs: 4.9 10*3/uL (ref 1.7–7.7)
Neutrophils Relative %: 68 %
Platelets: 151 10*3/uL (ref 150–400)
RBC: 4.62 MIL/uL (ref 4.22–5.81)
RDW: 13.3 % (ref 11.5–15.5)
WBC: 7.1 10*3/uL (ref 4.0–10.5)
nRBC: 0 % (ref 0.0–0.2)

## 2021-12-31 LAB — RAPID URINE DRUG SCREEN, HOSP PERFORMED
Amphetamines: NOT DETECTED
Barbiturates: NOT DETECTED
Benzodiazepines: NOT DETECTED
Cocaine: NOT DETECTED
Opiates: NOT DETECTED
Tetrahydrocannabinol: POSITIVE — AB

## 2021-12-31 LAB — COMPREHENSIVE METABOLIC PANEL
ALT: 44 U/L (ref 0–44)
AST: 28 U/L (ref 15–41)
Albumin: 3.3 g/dL — ABNORMAL LOW (ref 3.5–5.0)
Alkaline Phosphatase: 65 U/L (ref 38–126)
Anion gap: 10 (ref 5–15)
BUN: 17 mg/dL (ref 8–23)
CO2: 23 mmol/L (ref 22–32)
Calcium: 9.2 mg/dL (ref 8.9–10.3)
Chloride: 109 mmol/L (ref 98–111)
Creatinine, Ser: 1.21 mg/dL (ref 0.61–1.24)
GFR, Estimated: 59 mL/min — ABNORMAL LOW (ref >60)
Glucose, Bld: 92 mg/dL (ref 70–99)
Potassium: 3.6 mmol/L (ref 3.5–5.1)
Sodium: 142 mmol/L (ref 135–145)
Total Bilirubin: 1.2 mg/dL (ref 0.3–1.2)
Total Protein: 6.2 g/dL — ABNORMAL LOW (ref 6.5–8.1)

## 2021-12-31 LAB — TSH: TSH: 1.991 u[IU]/mL (ref 0.350–4.500)

## 2021-12-31 LAB — CK: Total CK: 112 U/L (ref 49–397)

## 2021-12-31 LAB — MAGNESIUM: Magnesium: 1.8 mg/dL (ref 1.7–2.4)

## 2021-12-31 LAB — VITAMIN B12: Vitamin B-12: 440 pg/mL (ref 180–914)

## 2021-12-31 LAB — AMMONIA: Ammonia: 12 umol/L (ref 9–35)

## 2021-12-31 LAB — FOLATE: Folate: 13.1 ng/mL (ref >5.9)

## 2021-12-31 MED ORDER — HYDRALAZINE HCL 10 MG PO TABS
10.0000 mg | ORAL_TABLET | Freq: Three times a day (TID) | ORAL | Status: DC
  Administered 2021-12-31 – 2022-01-03 (×9): 10 mg via ORAL
  Filled 2021-12-31 (×9): qty 1

## 2021-12-31 MED ORDER — ESCITALOPRAM OXALATE 10 MG PO TABS: 20.0000 mg | ORAL_TABLET | Freq: Every morning | ORAL | Status: DC

## 2021-12-31 NOTE — Progress Notes (Signed)
Unable to complete pt admission questions due to pt altered mental status. 

## 2021-12-31 NOTE — Progress Notes (Addendum)
PROGRESS NOTE    Brandt Chaney  NOB:[id number] DOB: 06/01/1936 DOA: 12/30/2021 PCP: Frederica Kuster, MD    Brief Narrative:   Jeffery [name] is a 86 y.o. male with medical history significant for dementia, depression, coronary disease status post PCI with stent placement in February 2021, essential hypertension, restrictive lung disease, chronic back pain status post spinal cord stimulator, BPH, who is admitted to [facility] on 12/30/2021 by way of transfer from [facility] emergency department with acute encephalopathy superimposed on dementia after presenting from home to the latter for evaluation of altered mental status. Family reports that over the last 2 weeks, the patient has appeared confused relative to the patient's baseline mental status in which he carries dx of dementia.  They also conveyed that the patient has been exhibiting behaviors that would otherwise be out of character for the patient and not typical of his daily routines, noting that he has been urinating in his closet at home relative to his baseline urinary habits in which he typically uses the commode.  He also has been reporting seeing things that are not there, which also represents a new feature for the patient.  Family is also noted the patient exhibited evidence that he is not appears steady on his feet over the last 1 to 2 weeks relative to baseline.  As a consequence of their perception of gait disturbance, they have noted the patient to Exhibit 2 ground-level falls, 1 of which was witnessed.  Patient was first noted to be exhibiting the above evidence of confusion while on a trip with his family to the state of [place].  While family had hoped that this confusion may be delirium as a result of being in a new location, they conveyed that the patient's confusion relative to baseline has persisted even after returning home from vacation.   Assessment and Plan: * Acute encephalopathy -potential contribution from  polypharmacy as well as several central acting medications on home med list, including Wellbutrin, Lexapro, prn Flexeril, Mirapex.  Per pharmacy no longer taking: Effient, mirapex, aldactone and terazosin -No overt evidence of seizures, no such history.   -MRI brain in September 2021 as well as CT head in March 2021, as further detailed above.  [facility] at [facility] discussed the patient's case with the on-call neurologist, Dr. Viviann Spare, Who recommended admission to the hospitalist service at [facility] for further evaluation of the patient's altered mental status, and conveyed that neurology would formally consult, with preliminary recommendation for pursuit of MRI brain.   -TSH/ammonia normal -of note, his UDS was positive for [facility]-- wife said 2 months ago she had given him "gummies" from the hemp store  Addendum: neuro recommending a psych consult-- placed  Depression -Hold home Wellbutrin and Lexapro (recent increase from 10 to 20 mg)  BPH (benign prostatic hyperplasia) - continue home tamsulosin.  -prn bladder scan  Restrictive lung disease -Continue fluticasone.  Primary hypertension -start hydralazine for now      DVT prophylaxis: SCDs Start: 12/30/21 2118    Code Status: Full Code Family Communication: spoke with wife  Disposition Plan:  Level of care: Telemetry Medical Status is: Inpatient Remains inpatient appropriate because: needs further work up    Consultants:  neurology   Subjective: Confused- in bathroom with tech  Objective: Vitals:   12/31/21 0134 12/31/21 0356 12/31/21 0808 12/31/21 0830  BP: (!) 138/96 (!) 155/112  (!) 152/108  Pulse: 77 86  75  Resp: 16 17  17   Temp: 97.6  F (36.4 C) 98.4 F (36.9 C)  98.4 F (36.9 C)  TempSrc: Oral   Oral  SpO2: 95% 94% 93% 95%  Weight:      Height:        Intake/Output Summary (Last 24 hours) at 12/31/2021 1236 Last data filed at 12/31/2021 0900 Gross per 24 hour  Intake 720 ml  Output --  Net 720 ml   Filed  Weights   12/30/21 1305  Weight: 114 kg    Examination:   General: Appearance:    Obese male in no acute distress, sitting on toilet     Lungs:     respirations unlabored  Heart:    Normal heart rate.    MS:   All extremities are intact.    Neurologic:   Awake, alert, oriented x 3. No apparent focal neurological           defect.        Data Reviewed: I have personally reviewed following labs and imaging studies  CBC: Recent Labs  Lab 12/30/21 1316 12/31/21 0235  WBC 6.6 7.1  NEUTROABS 4.7 4.9  HGB 14.5 14.8  HCT 43.6 43.9  MCV 94.4 95.0  PLT 161 [id number]   Basic Metabolic Panel: Recent Labs  Lab 12/26/21 1138 12/30/21 1316 12/31/21 0235  NA 139 143 142  K 4.0 3.7 3.6  CL 104 108 109  CO2 24 25 23   GLUCOSE 92 98 92  BUN 14 20 17   CREATININE 1.33* 1.18 1.21  CALCIUM 9.7 9.6 9.2  MG  --   --  1.8   GFR: Estimated Creatinine Clearance: 58.5 mL/min (by C-G formula based on SCr of 1.21 mg/dL). Liver Function Tests: Recent Labs  Lab 12/30/21 1316 12/31/21 0235  AST 30 28  ALT 48* 44  ALKPHOS 58 65  BILITOT 1.0 1.2  PROT 7.1 6.2*  ALBUMIN 4.2 3.3*   No results for input(s): "LIPASE", "AMYLASE" in the last 168 hours. Recent Labs  Lab 12/31/21 0235  AMMONIA 12   Coagulation Profile: Recent Labs  Lab 12/30/21 1316  INR 1.1   Cardiac Enzymes: Recent Labs  Lab 12/31/21 0235  CKTOTAL 112   BNP (last 3 results) No results for input(s): "PROBNP" in the last 8760 hours. HbA1C: No results for input(s): "HGBA1C" in the last 72 hours. CBG: No results for input(s): "GLUCAP" in the last 168 hours. Lipid Profile: No results for input(s): "CHOL", "HDL", "LDLCALC", "TRIG", "CHOLHDL", "LDLDIRECT" in the last 72 hours. Thyroid Function Tests: Recent Labs    12/31/21 0235  TSH 1.991   Anemia Panel: No results for input(s): "VITAMINB12", "FOLATE", "FERRITIN", "TIBC", "IRON", "RETICCTPCT" in the last 72 hours. Sepsis Labs: No results for input(s):  "PROCALCITON", "LATICACIDVEN" in the last 168 hours.  Recent Results (from the past 240 hour(s))  Culture, Urine     Status: None   Collection Time: 12/26/21 11:38 AM   Specimen: Urine  Result Value Ref Range Status   MICRO NUMBER: [id number]  Final   SPECIMEN QUALITY: Adequate  Final   Sample Source URINE  Final   STATUS: FINAL  Final   ISOLATE 1:   Final    Less than 10,000 CFU/mL of single Gram positive organism isolated. No further testing will be performed. If clinically indicated, recollection using a method to minimize contamination, with prompt transfer to Urine Culture Transport Tube, is recommended.  MICROSCOPIC MESSAGE     Status: None   Collection Time: 12/26/21 11:38 AM  Result Value Ref  Range Status   Note   Final    Comment: This urine was analyzed for the presence of WBC,  RBC, bacteria, casts, and other formed elements.  Only those elements seen were reported. . .          Radiology Studies: DG CHEST PORT 1 VIEW  Result Date: 12/31/2021 CLINICAL DATA:  Encephalopathy EXAM: PORTABLE CHEST 1 VIEW COMPARISON:  01/01/2021 FINDINGS: Stable cardiomegaly. Low lung volumes. Obscuration of the left costophrenic angle could reflect a small pleural effusion. Lungs appear otherwise clear. No pneumothorax. Lower thoracic spinal stimulator leads. IMPRESSION: Low lung volumes with obscuration of the left costophrenic angle could reflect a small pleural effusion. Consider further evaluation with repeat inspiratory PA and lateral radiographs of the chest. Electronically Signed   By: Duanne Guess D.O.   On: 12/31/2021 10:49   CT HEAD WO CONTRAST  Result Date: 12/30/2021 CLINICAL DATA:  Altered mental status with slurred speech and hallucinations. EXAM: CT HEAD WITHOUT CONTRAST TECHNIQUE: Contiguous axial images were obtained from the base of the skull through the vertex without intravenous contrast. RADIATION DOSE REDUCTION: This exam was performed according to the departmental  dose-optimization program which includes automated exposure control, adjustment of the mA and/or kV according to patient size and/or use of iterative reconstruction technique. COMPARISON:  MRI brain dated March 23, 2020. CT head dated September 17, 2019. FINDINGS: Brain: No evidence of acute infarction, hemorrhage, hydrocephalus, extra-axial collection or mass lesion/mass effect. Progressive moderate atrophy and chronic microvascular ischemic changes. Vascular: Atherosclerotic vascular calcification of the carotid siphons. No hyperdense vessel. Unchanged dolichoectasia of the basilar artery. Skull: Normal. Negative for fracture or focal lesion. Sinuses/Orbits: No acute finding. Other: None. IMPRESSION: 1. No acute intracranial abnormality. 2. Progressive moderate atrophy and chronic microvascular ischemic changes. Electronically Signed   By: Obie Dredge M.D.   On: 12/30/2021 14:18        Scheduled Meds:  aspirin  81 mg Oral Daily   budesonide  0.5 mg Nebulization BID   latanoprost  1 drop Both Eyes QHS   tamsulosin  0.4 mg Oral Daily   timolol  1 drop Both Eyes BID   Continuous Infusions:   LOS: 1 day    Time spent: 55 minutes spent on chart review, discussion with nursing staff, consultants, updating family and interview/physical exam; more than 50% of that time was spent in counseling and/or coordination of care.    Joseph Art, DO [facility] Available via Epic secure chat 7am-7pm After these hours, please refer to coverage provider listed on [email or web address] 12/31/2021, 12:36 PM

## 2021-12-31 NOTE — Consult Note (Addendum)
Neurology Consultation Reason for Consult: ams Referring Physician: Dr Marlin Canary  CC: ams  History is obtained from: chart review due to ams   HPI: Jeffery [name] is a 86 y.o. male with PMH of chronic pain with spinal stimulator, depression, anxiety, HTN, Migraine, cervical vertigo leading to chronic gait disturbance who was brought in for ams. Per wife, patient went to [place] wth family about 3 weeks ago and since then has been more confused, seeing spiders on walls, cockroaches on floor at times. Per wife he has been needing help with all ADLs for years. Initially it was due to his vertigo but she thinks now some of it is also due to cognitive impairment.   Denies recent medications changes, infection.   ROS: All other systems reviewed and negative except as noted in the HPI.   Past Medical History:  Diagnosis Date   Anxiety and depression    BPH (benign prostatic hyperplasia)    Coronary artery disease    Fecal incontinence    GERD (gastroesophageal reflux disease)    Glaucoma    Hard of hearing    Headache    History of pneumonia    Hyperlipidemia    Hypertension    Memory impairment    Migraine headache    Neck fracture (HCC)    2-3/2-21 followed by DR Sandi Carne in [place]    Nocturia    Osteoarthritis    Restrictive lung disease    Sleep apnea    wears CPAP does not know setting    Thyroid disease    Hyperparathyroidism    Urinary frequency    Urinary incontinence    Wears glasses     History reviewed. Patient's sister ws diagnosed and passed away from CJD about 2 years ago, no other family h/o dementia, CJD.   Social History:  reports that he has quit smoking. He has been exposed to tobacco smoke. He has never used smokeless tobacco. He reports current alcohol use. He reports that he does not use drugs.   Medications Prior to Admission  Medication Sig Dispense Refill Last Dose   acetaminophen (TYLENOL) 500 MG tablet Take 1,000 mg by mouth 2 (two) times daily.    12/30/2021 at am   amLODipine (NORVASC) 10 MG tablet TAKE 1 TABLET EVERY DAY (Patient taking differently: Take 10 mg by mouth every morning.) 90 tablet 3 12/29/2021   aspirin 81 MG tablet Take 81 mg by mouth at bedtime.   12/29/2021   buPROPion (WELLBUTRIN XL) 300 MG 24 hr tablet Take 300 mg by mouth every morning.   12/29/2021   CALCIUM PO Take 1,000 mg by mouth 2 (two) times daily.   12/29/2021   Cholecalciferol (VITAMIN D) 2000 units CAPS Take 2,000 Units by mouth every evening.   12/29/2021   colestipol (COLESTID) 1 g tablet Take 1 tablet (1 g total) by mouth 2 (two) times daily. (Patient taking differently: Take 2 g by mouth See admin instructions. Take 2 tablets (2 g) by mouth twice daily - do not take within one hour of other medications) 180 tablet 1 12/29/2021   escitalopram (LEXAPRO) 20 MG tablet Take 20 mg by mouth every morning.   12/29/2021   Fluticasone Furoate 200 MCG/ACT AEPB Inhale 1 puff into the lungs daily at 12 noon. (Patient taking differently: Inhale 1 puff into the lungs at bedtime.) 30 each 5 12/29/2021   furosemide (LASIX) 20 MG tablet Take 20 mg by mouth every morning.   12/29/2021  latanoprost (XALATAN) 0.005 % ophthalmic solution Place 1 drop into both eyes at bedtime.  1 12/29/2021   LIDOCAINE EX Place 1 patch onto the skin daily as needed (back pain).   12/28/2021   magnesium oxide (MAG-OX) 400 MG tablet Take 400 mg by mouth every morning.   12/29/2021   omeprazole (PRILOSEC) 20 MG capsule TAKE 1 CAPSULE ONE TIME DAILY AS NEEDED FOR ACID REFLUX (Patient taking differently: Take 20 mg by mouth 2 (two) times daily.) 90 capsule 1 12/29/2021   potassium chloride SA (KLOR-CON M) 20 MEQ tablet TAKE 1 TABLET EVERY DAY (Patient taking differently: Take 20 mEq by mouth every morning.) 90 tablet 1 12/29/2021   Probiotic Product (PROBIOTIC PO) Take 1 capsule by mouth at bedtime.   12/29/2021   tamsulosin (FLOMAX) 0.4 MG CAPS capsule TAKE 1 CAPSULE BY MOUTH ONCE DAILY WITH SUPPER (Patient taking differently:  Take 0.4 mg by mouth daily after supper.) 90 capsule 3 12/29/2021   timolol (TIMOPTIC) 0.5 % ophthalmic solution Place 1 drop into both eyes 2 (two) times daily.  0 12/29/2021 at 2200   vitamin B-12 (CYANOCOBALAMIN) 1000 MCG tablet Take 1,000 mcg by mouth every morning.   12/29/2021   butalbital-acetaminophen-caffeine (FIORICET) 50-325-40 MG tablet Take Two tablet in the morning and take two tablets in the evening. (Patient not taking: Reported on 12/31/2021) 30 tablet 0 Not Taking   cyclobenzaprine (FLEXERIL) 10 MG tablet Take 1 tablet (10 mg total) by mouth 3 (three) times daily as needed for muscle spasms. (Patient not taking: Reported on 12/31/2021) 30 tablet 0 Not Taking   nitroGLYCERIN (MINITRAN) 0.2 mg/hr patch Place 1 patch (0.2 mg total) onto the skin daily. Use I/4 patch q 24 hrs (Patient not taking: Reported on 12/31/2021) 30 patch 12 Not Taking   pramipexole (MIRAPEX) 0.25 MG tablet Take by mouth. (Patient not taking: Reported on 12/31/2021)   Not Taking   prasugrel (EFFIENT) 10 MG TABS tablet Take 10 mg by mouth daily. (Patient not taking: Reported on 12/31/2021)   Not Taking   spironolactone (ALDACTONE) 25 MG tablet Take 25 mg by mouth daily. (Patient not taking: Reported on 12/31/2021)   Not Taking   terazosin (HYTRIN) 5 MG capsule Take 1 capsule (5 mg total) by mouth every evening. (Patient not taking: Reported on 12/31/2021) 90 capsule 1 Not Taking      Exam: Current vital signs: BP (!) 152/108 (BP Location: Left Arm)   Pulse 75   Temp 98.4 F (36.9 C) (Oral)   Resp 17   Ht 5\' 10"  (1.778 m)   Wt 114 kg   SpO2 95%   BMI 36.06 kg/m  Vital signs in last 24 hours: Temp:  [97.6 F (36.4 C)-98.4 F (36.9 C)] 98.4 F (36.9 C) (07/04 0830) Pulse Rate:  [60-86] 75 (07/04 0830) Resp:  [15-22] 17 (07/04 0830) BP: (109-155)/(66-112) 152/108 (07/04 0830) SpO2:  [93 %-98 %] 95 % (07/04 0830)   Physical Exam  Constitutional: Appears well-developed and well-nourished.  Psych: Affect appropriate to  situation Eyes: No scleral injection Neuro: Aox3, CN 2-12 grossly intact, 5/5 in all extremities, FTN intact, sensory intct to light touch, reflex 2+ BL symmetric, no aphasia, able to tell me days of weeks backwards and serial subtraction once.   I have reviewed labs in epic and the results pertinent to this consultation are: CBC:  Recent Labs  Lab 12/30/21 1316 12/31/21 0235  WBC 6.6 7.1  NEUTROABS 4.7 4.9  HGB 14.5 14.8  HCT 43.6  43.9  MCV 94.4 95.0  PLT 161 151    Basic Metabolic Panel:  Lab Results  Component Value Date   NA 142 12/31/2021   K 3.6 12/31/2021   CO2 23 12/31/2021   GLUCOSE 92 12/31/2021   BUN 17 12/31/2021   CREATININE 1.21 12/31/2021   CALCIUM 9.2 12/31/2021   GFRNONAA 59 (L) 12/31/2021   GFRAA 76 07/24/2020   Lipid Panel:  Lab Results  Component Value Date   LDLCALC 136 (H) 11/14/2021   HgbA1c:  Lab Results  Component Value Date   HGBA1C 5.3 03/11/2021   Urine Drug Screen:     Component Value Date/Time   LABOPIA NONE DETECTED 12/31/2021 0819   COCAINSCRNUR NONE DETECTED 12/31/2021 0819   LABBENZ NONE DETECTED 12/31/2021 0819   AMPHETMU NONE DETECTED 12/31/2021 0819   THCU POSITIVE (A) 12/31/2021 0819   LABBARB NONE DETECTED 12/31/2021 0819    Alcohol Level     Component Value Date/Time   ETH <10 12/30/2021 1316     I have reviewed the images obtained: MR brain w and wo contrast 9/24/20221: No acute abnormality  Mild to moderate atrophy. Moderate chronic microvascular ischemic changes in the white matter.  MR Cervical spine wo contrast 09/20/2020:  1. Chronic fracture of the anterior arch of C1 is not well demonstrated. No residual marrow edema or appreciable change in alignment. 2. Cervical spondylosis as described and greatest at C7-T1 where there is mild canal stenosis and at least mild bilateral foraminal stenosis. 3. Mild-to-moderate bilateral foraminal stenosis at C4-5 and C5-6. 4. Partial fusion of the C2 through C6  vertebral bodies with extensive fusion of the right-sided facet joints from C3 through C6.    ASSESSMENT/PLAN: 86yo M with dementia brought in with ams.   Acute encephalopathy Visual hallucinations Hypoproteinemia and hypoalbuminemia - suspected delirium in setting of underlying dementia (? Lewy body)  Recommendations: - Psych consult for visual hallucinations as patient is already on lexapro and wellbutrin  - MRI brain w and wo contrast to asses for any acute abnormality - No h/o tick bite, no neck stiffness, low suspicion for CJD so will hold off on LP for now,. If needed, this will need to be under fluoro guidance due to spinal stimulator - B12, folate, MMA ordered and pending ( TSH, ammonia normal) - routine eeg to assess for epileptogenicity - continue delirium precautions - discussed plan with patient, wife and Dr Benjamine Mola  Thank you for allowing [place] to participate in the care of this patient. If you have any further questions, please contact  me or neurohospitalist.   Jeffery [name] Epilepsy [facility]

## 2022-01-01 ENCOUNTER — Inpatient Hospital Stay (HOSPITAL_COMMUNITY): Payer: Medicare HMO

## 2022-01-01 ENCOUNTER — Encounter: Payer: Self-pay | Admitting: Orthopedic Surgery

## 2022-01-01 DIAGNOSIS — R4182 Altered mental status, unspecified: Secondary | ICD-10-CM

## 2022-01-01 DIAGNOSIS — N401 Enlarged prostate with lower urinary tract symptoms: Secondary | ICD-10-CM | POA: Diagnosis not present

## 2022-01-01 DIAGNOSIS — I10 Essential (primary) hypertension: Secondary | ICD-10-CM | POA: Diagnosis not present

## 2022-01-01 DIAGNOSIS — G9340 Encephalopathy, unspecified: Secondary | ICD-10-CM | POA: Diagnosis not present

## 2022-01-01 DIAGNOSIS — R443 Hallucinations, unspecified: Secondary | ICD-10-CM

## 2022-01-01 DIAGNOSIS — F32A Depression, unspecified: Secondary | ICD-10-CM | POA: Diagnosis not present

## 2022-01-01 LAB — CBC
HCT: 44.1 % (ref 39.0–52.0)
Hemoglobin: 15 g/dL (ref 13.0–17.0)
MCH: 31.6 pg (ref 26.0–34.0)
MCHC: 34 g/dL (ref 30.0–36.0)
MCV: 92.8 fL (ref 80.0–100.0)
Platelets: 161 10*3/uL (ref 150–400)
RBC: 4.75 MIL/uL (ref 4.22–5.81)
RDW: 13.4 % (ref 11.5–15.5)
WBC: 7.1 10*3/uL (ref 4.0–10.5)
nRBC: 0 % (ref 0.0–0.2)

## 2022-01-01 LAB — BASIC METABOLIC PANEL
Anion gap: 11 (ref 5–15)
BUN: 13 mg/dL (ref 8–23)
CO2: 20 mmol/L — ABNORMAL LOW (ref 22–32)
Calcium: 8.8 mg/dL — ABNORMAL LOW (ref 8.9–10.3)
Chloride: 108 mmol/L (ref 98–111)
Creatinine, Ser: 1.08 mg/dL (ref 0.61–1.24)
GFR, Estimated: >60 mL/min (ref >60)
Glucose, Bld: 93 mg/dL (ref 70–99)
Potassium: 3.2 mmol/L — ABNORMAL LOW (ref 3.5–5.1)
Sodium: 139 mmol/L (ref 135–145)

## 2022-01-01 MED ORDER — GADOBUTROL 1 MMOL/ML IV SOLN
10.0000 mL | Freq: Once | INTRAVENOUS | Status: AC | PRN
  Administered 2022-01-01: 10 mL via INTRAVENOUS

## 2022-01-01 MED ORDER — POTASSIUM CHLORIDE CRYS ER 20 MEQ PO TBCR
40.0000 meq | EXTENDED_RELEASE_TABLET | ORAL | Status: AC
  Administered 2022-01-01 (×2): 40 meq via ORAL
  Filled 2022-01-01 (×3): qty 2

## 2022-01-01 MED ORDER — AMLODIPINE BESYLATE 10 MG PO TABS
10.0000 mg | ORAL_TABLET | Freq: Every morning | ORAL | Status: DC
  Administered 2022-01-01 – 2022-01-02 (×2): 10 mg via ORAL
  Filled 2022-01-01 (×2): qty 1

## 2022-01-01 MED ORDER — IPRATROPIUM-ALBUTEROL 0.5-2.5 (3) MG/3ML IN SOLN
3.0000 mL | Freq: Four times a day (QID) | RESPIRATORY_TRACT | Status: DC | PRN
  Administered 2022-01-04: 3 mL via RESPIRATORY_TRACT
  Filled 2022-01-01 (×2): qty 3

## 2022-01-01 MED ORDER — QUETIAPINE FUMARATE 25 MG PO TABS
25.0000 mg | ORAL_TABLET | Freq: Every day | ORAL | Status: DC
  Administered 2022-01-01 – 2022-01-06 (×6): 25 mg via ORAL
  Filled 2022-01-01 (×6): qty 1

## 2022-01-01 MED ORDER — IPRATROPIUM-ALBUTEROL 0.5-2.5 (3) MG/3ML IN SOLN
3.0000 mL | Freq: Once | RESPIRATORY_TRACT | Status: DC
Start: 2022-01-01 — End: 2022-01-07
  Filled 2022-01-01: qty 3

## 2022-01-01 NOTE — Progress Notes (Signed)
[facility]  PROGRESS NOTE  Jeffery [name] DPO:[id number] DOB: [birth year] DOA: 12/30/2021 PCP: Jeffery Kuster, MD   Brief HPI:   85 year old male with medical history of dementia, depression, CAD [name]/p PCI with stent placement in February 2021, essential hypertension, restrictive lung disease, chronic back pain status post spinal cord stimulator, BPH, who is admitted to [facility] on 12/30/2021 by way of transfer from [facility] emergency department with acute encephalopathy superimposed on dementia after presenting from home to the latter for evaluation of altered mental status. Patient has been confused for about 2 weeks.  At baseline he does have history of dementia.  He also was having visual hallucinations.    Subjective   Patient seen and examined, denies having visual hallucinations today.   Assessment/Plan:    Acute encephalopathy -Significantly improved -Likely from polypharmacy -Patient on multiple medication including Wellbutrin, Lexapro, Flexeril, Mirapex -Neurology consulted -TSH/ammonia normal -UDS was positive for THC-wife said 2 months ago she had given him Gummies from the hemp store -Neurology recommended psychiatry consultation for visual loose [place] -Psychiatry consulted  Depression -Home medications include Wellbutrin, Lexapro on hold  BPH -Continue tamsulosin  Restrictive lung disease -Continue Pulmicort nebulizer twice daily -We will start DuoNeb nebulizer every 6 hours as needed for shortness of breath -Oxygen via nasal cannula.  As needed  Hypertension -Continue hydralazine -We will restart amlodipine 10 mg daily  Hypokalemia -Potassium is 3.2 -We will give K-Dur 40 meq p.o. x 2 -Follow BMP in am   Medications     aspirin  81 mg Oral Daily   budesonide  0.5 mg Nebulization BID   hydrALAZINE  10 mg Oral Q8H   ipratropium-albuterol  3 mL Nebulization Once   latanoprost  1 drop Both Eyes QHS   potassium chloride  40 mEq  Oral Q4H   tamsulosin  0.4 mg Oral Daily   timolol  1 drop Both Eyes BID     Data Reviewed:   CBG:  No results for input([name]): "GLUCAP" in the last 168 hours.  SpO2: 100 %    Vitals:   01/01/22 0549 01/01/22 0811 01/01/22 0841 01/01/22 1300  BP: (!) 148/99 (!) 139/100  (!) 155/105  Pulse:  100 98 92  Resp:   18 (!) 27  Temp:  98 F (36.7 C)  98.2 F (36.8 C)  TempSrc:  Oral  Oral  SpO2:  96%  100%  Weight:      Height:          Data Reviewed:  Basic Metabolic Panel: Recent Labs  Lab 12/26/21 1138 12/30/21 1316 12/31/21 0235 01/01/22 0212  NA 139 143 142 139  K 4.0 3.7 3.6 3.2*  CL 104 108 109 108  CO2 24 25 23  20*  GLUCOSE 92 98 92 93  BUN 14 20 17 13   CREATININE 1.33* 1.18 1.21 1.08  CALCIUM 9.7 9.6 9.2 8.8*  MG  --   --  1.8  --     CBC: Recent Labs  Lab 12/30/21 1316 12/31/21 0235 01/01/22 0212  WBC 6.6 7.1 7.1  NEUTROABS 4.7 4.9  --   HGB 14.5 14.8 15.0  HCT 43.6 43.9 44.1  MCV 94.4 95.0 92.8  PLT 161 151 161    LFT Recent Labs  Lab 12/30/21 1316 12/31/21 0235  AST 30 28  ALT 48* 44  ALKPHOS 58 65  BILITOT 1.0 1.2  PROT 7.1 6.2*  ALBUMIN 4.2 3.3*     Antibiotics: Anti-infectives (From admission, onward)  None        DVT prophylaxis: SCDs  Code Status: Full code  Family Communication: No family at bedside   CONSULTS neurology   Objective    Physical Examination:   General-appears in no acute distress Heart-S1-S2, regular, no murmur auscultated Lungs-clear to auscultation bilaterally, no wheezing or crackles auscultated Abdomen-soft, nontender, no organomegaly Extremities-no edema in the lower extremities Neuro-alert, oriented x3, no focal deficit noted   Status is: Inpatient: Acute encephalopathy    Pressure Injury 12/30/21 Buttocks Medial Stage 2 -  Partial thickness loss of dermis presenting as a shallow open injury with a red, pink wound bed without slough. (Active)  12/30/21 2100  Location:  Buttocks  Location Orientation: Medial  Staging: Stage 2 -  Partial thickness loss of dermis presenting as a shallow open injury with a red, pink wound bed without slough.  Wound Description (Comments):   Present on Admission: Yes      Jeffery [name] Jeffery [name]   [name] If 7PM-7AM, please contact night-coverage at [email or web address], Office  [phone]   01/01/2022, 1:59 PM  LOS: 2 days

## 2022-01-01 NOTE — Progress Notes (Signed)
EEG complete - results pending 

## 2022-01-01 NOTE — Progress Notes (Signed)
This patient has a Nevro neuro stimulator implanted. The remote to his device is not able to perform an impedance check to ensure it is safe to scan. A rep is coming to troubleshoot remote and has wife's number to coordinate. We will be able to scan his MRI once his remote is working correctly.

## 2022-01-01 NOTE — Consult Note (Signed)
[facility] Face-to-Face Psychiatry Consult   Reason for Consult:  Per neuro: recommendation regarding hallucination as he is already on 2 psych meds ( lexapro and wellbutrin ) so I dont feel confident adding a third one  Referring Physician:  Dr. Benjamine Mola Patient Identification: Jeffery [name] MRN:  [id number] Principal Diagnosis: Acute encephalopathy Diagnosis:  Principal Problem:   Acute encephalopathy Active Problems:   Primary hypertension   Restrictive lung disease   BPH (benign prostatic hyperplasia)   Depression   Pressure injury of skin   Total Time spent with patient: 1 hour  Subjective:   Jeffery [name] is a 86 y.o. male patient admitted with acute encephalopathy superimposed on dementia.   Patient seen and chart reviewed-patient is interviewed this afternoon, no family present.  Patient is an overall fair historian and is able to provide answers to most history questions asked.  He does provide consent to contact wife " Ro" for additional information.  Patient is oriented to self, time, and hospital and is age appropriate on interview.  Patient describes his mood as " good".  He states he is sleeping " poorly" and contributes this due to visual hallucinations and fear.  He states he recently started seeing things about 3 weeks ago.  In the same sentence he notes he understands these things are not real, however he still sees him and is not sure why.  He reports having nightmares for approximately 1 to 2 weeks prior to the hospital presentation, and current medication he has taken does not appear to help.  He states he is taking Lexapro and Wellbutrin (for my stress).  He is unable to provide additional information regarding stress and or triggers.  Chart review is unavailable at this time, as his current primary care provider is not within calm and or epic system.   Patient denies SI/HI/visual hallucinations.  Patient denies current auditory hallucinations but does report that he will  occasionally see things such as mouse, spiders, and roaches.  He denies any paranoia, delusional thinking, ideas of reference.  On exam he does not appear to be responding to internal stimuli, external stimuli, and or does displaying delusional thought disorder.  He answers most questions appropriately, and has linear thought processes.    Will attempt to contact wife for collateral information.  We will likely start low-dose Seroquel to target visual hallucinations.  Recommend patient continue current medical work-up for dementia, current presentation is consistent with underlying Lewy body dementia.  Psychiatry will continue to follow in the event medical diagnosis is not determine, will recommend inpatient psychiatric admission.  HPI:  86 year old male brought into the ER with chief complaint of acute mental status change.   Patient brought here by his wife and his daughter.  Patient has history of some memory impairment, but no dementia, hypertension, hyperlipidemia, depression.    According to the family, in the middle of June they went to [place] to visit patient's son.  While there, patient was disoriented.  He started having sleep disturbance and would wake up in the middle the night, questioning why things look different.  They chalked the change to change in scenery.  Once they got home however, patient continues to have cognitive decline.  Patient continues to wake up in the middle the night often.  He is also hallucinating and seeing things like son cleaning jars, mice in the house, cockroaches in the house.  In the middle the night he is found to be trying to clean the knife, when  there is no knife in the bed.   Moreover, family has noted that patient has been having worsening gait.  He shuffles now when he walks and has had increased balance issues.  Patient has had 2 falls in the last week.  Past Psychiatric History: Denies although has a history of depression and anxiety for which she  has taken Lexapro and Wellbutrin at this time.  He is currently seeing his primary care provider who is prescribing both medications.  Denies any history of inpatient psychiatric hospitalization.  Denies any history of suicidal attempts.  Risk to Self: Denies Risk to Others: Denies Prior Inpatient Therapy: Denies Prior Outpatient Therapy: Denies  Past Medical History:  Past Medical History:  Diagnosis Date   Anxiety and depression    BPH (benign prostatic hyperplasia)    Coronary artery disease    Fecal incontinence    GERD (gastroesophageal reflux disease)    Glaucoma    Hard of hearing    Headache    History of pneumonia    Hyperlipidemia    Hypertension    Memory impairment    Migraine headache    Neck fracture (HCC)    2-3/2-21 followed by DR Sandi Carne in [place]    Nocturia    Osteoarthritis    Restrictive lung disease    Sleep apnea    wears CPAP does not know setting    Thyroid disease    Hyperparathyroidism    Urinary frequency    Urinary incontinence    Wears glasses     Past Surgical History:  Procedure Laterality Date   CARDIAC CATHETERIZATION     CIRCUMCISION     COLONOSCOPY     CORONARY ANGIOPLASTY     stents- 08/2019    ESOPHAGOGASTRODUODENOSCOPY     EYE SURGERY Bilateral    cataract   HAND SURGERY Right    HIP ARTHROPLASTY Right    HIP ARTHROPLASTY Left 2006   KNEE CARTILAGE SURGERY Bilateral    LUMBAR LAMINECTOMY     L3 - L4   RETINAL DETACHMENT SURGERY Right    x2   REVERSE SHOULDER ARTHROPLASTY Right 05/29/2016   Procedure: REVERSE SHOULDER ARTHROPLASTY;  Surgeon: Francena Hanly, MD;  Location: [facility] OR;  Service: Orthopedics;  Laterality: Right;   SHOULDER SURGERY Bilateral    TOTAL HIP REVISION Right 04/04/2020   Procedure: Right hip acetabular liner revision;  Surgeon: [name], MD;  Location: [facility] ORS;  Service: Orthopedics;  Laterality: Right;    Family History: History reviewed. No pertinent family history. Family Psychiatric   History: Unable to assess Social History:  Social History   Substance and Sexual Activity  Alcohol Use Yes   Comment: occasional     Social History   Substance and Sexual Activity  Drug Use No    Social History   Socioeconomic History   Marital status: Married    Spouse name: Not on file   Number of children: Not on file   Years of education: Not on file   Highest education level: Not on file  Occupational History   Not on file  Tobacco Use   Smoking status: Former    Passive exposure: Past   Smokeless tobacco: Never   Tobacco comments:    quit in approximately  2000  Vaping Use   Vaping Use: Never used  Substance and Sexual Activity   Alcohol use: Yes    Comment: occasional   Drug use: No   Sexual activity: Not Currently  Other Topics Concern   Not on file  Social History Narrative   Diet: Blank      Do you drink/ eat things with caffeine? Yes      Marital status: Married                              What year were you married ? 1983      Do you live in a house, apartment,assistred living, condo, trailer, etc.)? House      Is it one or more stories? One Story      How many persons live in your home ? 2      Do you have any pets in your home ?(please list) No      Highest Level of education completed: GED      Current or past profession:  Retired Animator, [employer], Self Employed      Do you exercise?  A Little                            Type & how often Blank      ADVANCED DIRECTIVES (Please bring copies)      Do you have a living will? Yes      Do you have a DNR form?   Yes                    If not, do you want to discuss one?       Do you have signed POA?HPOA forms? Yes                If so, please bring to your appointment      FUNCTIONAL STATUS- To be completed by Spouse / child / Staff       Do you have difficulty bathing or dressing yourself ? Yes ( Dressing at times)      Do you have difficulty preparing food or eating ? Yes (At  times)      Do you have difficulty managing your mediation ? No      Do you have difficulty managing your finances ? No      Do you have difficulty affording your medication ? No      Social Determinants of Corporate investment banker Strain: Not on file  Food Insecurity: Not on file  Transportation Needs: Not on file  Physical Activity: Not on file  Stress: Not on file  Social Connections: Not on file   Additional Social History:    Allergies:  No Known Allergies  Labs:  Results for orders placed or performed during the hospital encounter of 12/30/21 (from the past 48 hour(s))  Vitamin B12     Status: None   Collection Time: 12/31/21  2:33 AM  Result Value Ref Range   Vitamin B-12 440 180 - 914 pg/mL    Comment: (NOTE) This assay is not validated for testing neonatal or myeloproliferative syndrome specimens for Vitamin B12 levels. Performed at [facility], [address] [place], [place] [address]   Folate     Status: None   Collection Time: 12/31/21  2:33 AM  Result Value Ref Range   Folate 13.1 >5.9 ng/mL    Comment: Performed at [facility], [address], [place], [place] [address]  Comprehensive metabolic panel     Status: Abnormal   Collection Time:  12/31/21  2:35 AM  Result Value Ref Range   Sodium 142 135 - 145 mmol/L   Potassium 3.6 3.5 - 5.1 mmol/L   Chloride 109 98 - 111 mmol/L   CO2 23 22 - 32 mmol/L   Glucose, Bld 92 70 - 99 mg/dL    Comment: Glucose reference range applies only to samples taken after fasting for at least 8 hours.   BUN 17 8 - 23 mg/dL   Creatinine, Ser 1.61 0.61 - 1.24 mg/dL   Calcium 9.2 8.9 - 09.6 mg/dL   Total Protein 6.2 (L) 6.5 - 8.1 g/dL   Albumin 3.3 (L) 3.5 - 5.0 g/dL   AST 28 15 - 41 U/L   ALT 44 0 - 44 U/L   Alkaline Phosphatase 65 38 - 126 U/L   Total Bilirubin 1.2 0.3 - 1.2 mg/dL   GFR, Estimated 59 (L) >60 mL/min    Comment: (NOTE) Calculated using the CKD-EPI Creatinine Equation (2021)    Anion  gap 10 5 - 15    Comment: Performed at [facility], [address] [place], [place] [address]  Magnesium     Status: None   Collection Time: 12/31/21  2:35 AM  Result Value Ref Range   Magnesium 1.8 1.7 - 2.4 mg/dL    Comment: Performed at [facility] Lab, [address] [place], [place] [address]  CBC with Differential/Platelet     Status: None   Collection Time: 12/31/21  2:35 AM  Result Value Ref Range   WBC 7.1 4.0 - 10.5 K/uL   RBC 4.62 4.22 - 5.81 MIL/uL   Hemoglobin 14.8 13.0 - 17.0 g/dL   HCT 14.7 82.9 - 56.2 %   MCV 95.0 80.0 - 100.0 fL   MCH 32.0 26.0 - 34.0 pg   MCHC 33.7 30.0 - 36.0 g/dL   RDW 13.0 86.5 - 78.4 %   Platelets 151 150 - 400 K/uL   nRBC 0.0 0.0 - 0.2 %   Neutrophils Relative % 68 %   Neutro Abs 4.9 1.7 - 7.7 K/uL   Lymphocytes Relative 19 %   Lymphs Abs 1.3 0.7 - 4.0 K/uL   Monocytes Relative 9 %   Monocytes Absolute 0.7 0.1 - 1.0 K/uL   Eosinophils Relative 2 %   Eosinophils Absolute 0.1 0.0 - 0.5 K/uL   Basophils Relative 1 %   Basophils Absolute 0.1 0.0 - 0.1 K/uL   Immature Granulocytes 1 %   Abs Immature Granulocytes 0.05 0.00 - 0.07 K/uL    Comment: Performed at [facility], [address], [place], [place] [address]  Ammonia     Status: None   Collection Time: 12/31/21  2:35 AM  Result Value Ref Range   Ammonia 12 9 - 35 umol/L    Comment: Performed at [facility], [address] [place], [place] [address]  CK     Status: None   Collection Time: 12/31/21  2:35 AM  Result Value Ref Range   Total CK 112 49 - 397 U/L    Comment: Performed at [facility], [address], [place], [place] [address]  TSH     Status: None   Collection Time: 12/31/21  2:35 AM  Result Value Ref Range   TSH 1.991 0.350 - 4.500 uIU/mL    Comment: Performed by a 3rd Generation assay with a functional sensitivity of <=0.01 uIU/mL. Performed at [facility], [address] [place], [place] [address]   Blood gas, venous      Status: Abnormal   Collection Time: 12/31/21  2:35 AM  Result Value Ref Range   pH, Ven 7.42 7.25 - 7.43   pCO2, Ven 41 (L) 44 - 60 mmHg   pO2, Ven 36 32 - 45 mmHg   Bicarbonate 27.2 20.0 - 28.0 mmol/L   Acid-Base Excess 2.4 (H) 0.0 - 2.0 mmol/L   O2 Saturation 57.3 %   Patient temperature 36.7    Collection site RIGHT ANTECUBITAL    Drawn by [id number]     Comment: T GRIFFITH Performed at [facility], [address] [name], [place] [address]   Rapid urine drug screen (hospital performed)     Status: Abnormal   Collection Time: 12/31/21  8:19 AM  Result Value Ref Range   Opiates NONE DETECTED NONE DETECTED   Cocaine NONE DETECTED NONE DETECTED   Benzodiazepines NONE DETECTED NONE DETECTED   Amphetamines NONE DETECTED NONE DETECTED   Tetrahydrocannabinol POSITIVE (A) NONE DETECTED   Barbiturates NONE DETECTED NONE DETECTED    Comment: (NOTE) DRUG SCREEN FOR MEDICAL PURPOSES ONLY.  IF CONFIRMATION IS NEEDED FOR ANY PURPOSE, NOTIFY LAB WITHIN 5 DAYS.  LOWEST DETECTABLE LIMITS FOR URINE DRUG SCREEN Drug Class                     Cutoff (ng/mL) Amphetamine and metabolites    1000 Barbiturate and metabolites    200 Benzodiazepine                 200 Tricyclics and metabolites     300 Opiates and metabolites        300 Cocaine and metabolites        300 THC                            50 Performed at [facility] Lab, [address], [place], [place] [address]   CBC     Status: None   Collection Time: 01/01/22  2:12 AM  Result Value Ref Range   WBC 7.1 4.0 - 10.5 K/uL   RBC 4.75 4.22 - 5.81 MIL/uL   Hemoglobin 15.0 13.0 - 17.0 g/dL   HCT 35.3 61.4 - 43.1 %   MCV 92.8 80.0 - 100.0 fL   MCH 31.6 26.0 - 34.0 pg   MCHC 34.0 30.0 - 36.0 g/dL   RDW 54.0 08.6 - 76.1 %   Platelets 161 150 - 400 K/uL   nRBC 0.0 0.0 - 0.2 %    Comment: Performed at [facility], [address], [place], [place] [address]  Basic metabolic panel     Status: Abnormal   Collection  Time: 01/01/22  2:12 AM  Result Value Ref Range   Sodium 139 135 - 145 mmol/L   Potassium 3.2 (L) 3.5 - 5.1 mmol/L   Chloride 108 98 - 111 mmol/L   CO2 20 (L) 22 - 32 mmol/L   Glucose, Bld 93 70 - 99 mg/dL    Comment: Glucose reference range applies only to samples taken after fasting for at least 8 hours.   BUN 13 8 - 23 mg/dL   Creatinine, Ser 2.67 0.61 - 1.24 mg/dL   Calcium 8.8 (L) 8.9 - 10.3 mg/dL   GFR, Estimated >12 >45 mL/min    Comment: (NOTE) Calculated using the CKD-EPI Creatinine Equation (2021)  Anion gap 11 5 - 15    Comment: Performed at [facility] Lab, [address], [place], [place] [address]    Current Facility-Administered Medications  Medication Dose Route Frequency Provider Last Rate Last Admin   acetaminophen (TYLENOL) tablet 650 mg  650 mg Oral Q6H PRN Howerter, Justin B, DO   650 mg at 01/01/22 1158   Or   acetaminophen (TYLENOL) suppository 650 mg  650 mg Rectal Q6H PRN Howerter, Justin B, DO       amLODipine (NORVASC) tablet 10 mg  10 mg Oral q morning Lama, Gagan S, MD   10 mg at 01/01/22 1500   aspirin chewable tablet 81 mg  81 mg Oral Daily Howerter, Justin B, DO   81 mg at 01/01/22 0945   budesonide (PULMICORT) nebulizer solution 0.5 mg  0.5 mg Nebulization BID Howerter, Justin B, DO   0.5 mg at 01/01/22 0840   hydrALAZINE (APRESOLINE) tablet 10 mg  10 mg Oral Q8H Vann, Jessica U, DO   10 mg at 01/01/22 1424   ipratropium-albuterol (DUONEB) 0.5-2.5 (3) MG/3ML nebulizer solution 3 mL  3 mL Nebulization Once [place], Sarina Ill, MD       ipratropium-albuterol (DUONEB) 0.5-2.5 (3) MG/3ML nebulizer solution 3 mL  3 mL Nebulization Q6H PRN Sharl Ma, Sarina Ill, MD       latanoprost (XALATAN) 0.005 % ophthalmic solution 1 drop  1 drop Both Eyes QHS Howerter, Justin B, DO   1 drop at 12/31/21 2136   potassium chloride SA (KLOR-CON M) CR tablet 40 mEq  40 mEq Oral Q4H Meredeth Ide, MD   40 mEq at 01/01/22 1202   tamsulosin (FLOMAX) capsule 0.4 mg  0.4 mg Oral Daily  Howerter, Justin B, DO   0.4 mg at 01/01/22 0945   timolol (TIMOPTIC) 0.5 % ophthalmic solution 1 drop  1 drop Both Eyes BID Howerter, Justin B, DO   1 drop at 01/01/22 1400    Musculoskeletal: Strength & Muscle Tone: within normal limits Gait & Station: normal Patient leans: N/A            Psychiatric Specialty Exam:  Presentation  General Appearance: Appropriate for Environment Eye Contact:Fair Speech:Clear and Coherent; Garbled Speech Volume:Normal Handedness:Right  Mood and Affect  Mood:Euthymic Affect:Appropriate; Blunt  Thought Process  Thought Processes:Linear; Coherent Descriptions of Associations:Intact  Orientation:Full (Time, Place and Person)  Thought Content:Logical  History of Schizophrenia/Schizoaffective disorder:No data recorded Duration of Psychotic Symptoms:No data recorded Hallucinations:Hallucinations: Visual Description of Visual Hallucinations: spiders, roaches, mouses and other things. I know they are not there  Ideas of Reference:None  Suicidal Thoughts:Suicidal Thoughts: No  Homicidal Thoughts:Homicidal Thoughts: No   Sensorium  Memory:Immediate Fair; Recent Poor; Remote Poor Judgment:Fair Insight:Shallow  Executive Functions  Concentration:Poor Attention Span:Poor Recall:Poor Fund of Knowledge:Poor Language:Poor  Psychomotor Activity  Psychomotor Activity:Psychomotor Activity: Normal; Restlessness; Tremor  Assets  Assets:Financial Resources/Insurance; Housing; Manufacturing systems engineer; Physical Health; Resilience; Social Support  Sleep  Sleep:Sleep: Poor  Physical Exam: Physical Exam Vitals reviewed.  Constitutional:      Appearance: Normal appearance. He is normal weight.  Cardiovascular:     Rate and Rhythm: Normal rate.  Abdominal:     General: Abdomen is flat.  Neurological:     General: No focal deficit present.     Mental Status: He is alert and oriented to person, place, and time. Mental status is at  baseline.  Psychiatric:        Mood and Affect: Mood normal.  Behavior: Behavior normal.        Thought Content: Thought content normal.    ROS Blood pressure (!) 130/101, pulse 92, temperature 98.2 F (36.8 C), temperature source Oral, resp. rate 18, height 5\' 10"  (1.778 m), weight 114 kg, SpO2 100 %. Body mass index is 36.06 kg/m.   Treatment Plan Summary: Plan   Will add low-dose Seroquel 25 mg p.o. nightly to target visual hallucinations. -Recommend to continue further work-up by neurology. -Continue delirium precautions, patient at high risk for developing delirium and acute hospital setting. -Psychiatry will continue to follow at this time. -Will continue to contact wife for collateral information. Disposition:  Final disposition is pending neurology work-up.  Patient currently has history of dementia, new onset visual hallucinations suspect Lewy bodies.  Maryagnes Amosakia S Starkes-Perry, FNP 01/01/2022 4:17 PM

## 2022-01-01 NOTE — Procedures (Incomplete)
Patient Name: Jeffery [name]  MRN: [id number]  Epilepsy Attending: Charlsie Quest  Referring Physician/Provider: Charlsie Quest, MD Date: 01/01/2022 Duration: 30.36 mins  Patient history: 86yo M with dementia brought in with ams. EEG to evaluate for seizure.  Level of alertness: Awake,asleep  AEDs during EEG study: None  Technical aspects: This EEG study was done with scalp electrodes positioned according to the 10-20 International system of electrode placement. Electrical activity was acquired at a sampling rate of 500Hz  and reviewed with a high frequency filter of 70Hz  and a low frequency filter of 1Hz . EEG data were recorded continuously and digitally stored.   Description: The posterior dominant rhythm consists of 7 Hz activity of moderate voltage (25-35 uV) seen predominantly in posterior head regions, symmetric and reactive to eye opening and eye closing. Sleep was characterized by vertex waves, maximal frontocentral region.  EEG showed continuous generalized 4.5 to 7 Hz theta slowing. Hyperventilation and photic stimulation were not performed.     Patient was noted to have brief sudden whole-body jerks during sleep.  Concomitant EEG before, during and after the event did not show any EEG change to suggest seizure.  ABNORMALITY - Continuous slow, generalized - Background slow  IMPRESSION: This study is suggestive of mild diffuse encephalopathy, nonspecific etiology. No seizures or epileptiform discharges were seen throughout the recording.  Patient was noted to have brief sudden whole body jerks during sleep without concomitant EEG change. These episodes were not epileptic.  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  Clinical correlation is recommended.  Vedanshi Massaro 

## 2022-01-02 DIAGNOSIS — J984 Other disorders of lung: Secondary | ICD-10-CM | POA: Diagnosis not present

## 2022-01-02 DIAGNOSIS — I10 Essential (primary) hypertension: Secondary | ICD-10-CM | POA: Diagnosis not present

## 2022-01-02 DIAGNOSIS — G9340 Encephalopathy, unspecified: Secondary | ICD-10-CM | POA: Diagnosis not present

## 2022-01-02 LAB — COMPREHENSIVE METABOLIC PANEL
ALT: 59 U/L — ABNORMAL HIGH (ref 0–44)
AST: 37 U/L (ref 15–41)
Albumin: 3.2 g/dL — ABNORMAL LOW (ref 3.5–5.0)
Alkaline Phosphatase: 70 U/L (ref 38–126)
Anion gap: 10 (ref 5–15)
BUN: 15 mg/dL (ref 8–23)
CO2: 22 mmol/L (ref 22–32)
Calcium: 9 mg/dL (ref 8.9–10.3)
Chloride: 110 mmol/L (ref 98–111)
Creatinine, Ser: 1.16 mg/dL (ref 0.61–1.24)
GFR, Estimated: >60 mL/min (ref >60)
Glucose, Bld: 93 mg/dL (ref 70–99)
Potassium: 3.8 mmol/L (ref 3.5–5.1)
Sodium: 142 mmol/L (ref 135–145)
Total Bilirubin: 1.4 mg/dL — ABNORMAL HIGH (ref 0.3–1.2)
Total Protein: 6.1 g/dL — ABNORMAL LOW (ref 6.5–8.1)

## 2022-01-02 MED ORDER — COLESTIPOL HCL 1 G PO TABS
1.0000 g | ORAL_TABLET | Freq: Two times a day (BID) | ORAL | Status: DC
  Administered 2022-01-02 – 2022-01-05 (×6): 1 g via ORAL
  Filled 2022-01-02 (×8): qty 1

## 2022-01-02 NOTE — Care Management Important Message (Signed)
Important Message  Patient Details  Name: Jeffery [name] MRN: [id number] Date of Birth: [birth year]   Medicare Important Message Given:  Yes     Serrena Linderman 01/02/2022, 3:20 PM

## 2022-01-02 NOTE — Evaluation (Signed)
Occupational Therapy Evaluation Patient Details Name: Jeffery [name] MRN: [id number] DOB: Aug 22, 1935 Today's Date: 01/02/2022   History of Present Illness 86 y/o male presented to ED on 12/30/21 for worsening confusion with hallucinations x 3 weeks. PMH: dementia, CAD s/p stent placement, HTN, restrictive lung disease, chronic back pain s/p spinal cord stimulator   Clinical Impression   PTA, pt lives with spouse, typically ambulatory with Rollator and receives intermittent assist for ADLs. Pt's spouse assists with IADLs, including med mgmt d/t pt dementia. Pt endorses a hx of falls and presents now with deficits in standing balance, endurance and overall strength. Pt with cognitive deficits at baseline though appropriate and functional during session. Overall, pt requires Setup for UB ADLs, Mod A for LB ADLs and min guard to Min A for transfers with RW (increased cues needed for safe RW use). Anticipate that pt will continue to progress during admission to return home with [facility] services pending wife's ability to continue assisting pt. Of note, pt reports they have been attempting to obtain [facility].  HR sustained 120s with activity SpO2 >98% on 2 L O2, 2/4 DOE   BP WFL     Recommendations for follow up therapy are one component of a multi-disciplinary discharge planning process, led by the attending physician.  Recommendations may be updated based on patient status, additional functional criteria and insurance authorization.   Follow Up Recommendations  Home health OT    Assistance Recommended at Discharge Frequent or constant Supervision/Assistance  Patient can return home with the following A little help with walking and/or transfers;A little help with bathing/dressing/bathroom;Assistance with cooking/housework;Direct supervision/assist for medications management;Direct supervision/assist for financial management;Assist for transportation;Help with stairs or ramp for  entrance    Functional Status Assessment  Patient has had a recent decline in their functional status and demonstrates the ability to make significant improvements in function in a reasonable and predictable amount of time.  Equipment Recommendations  BSC/3in1    Recommendations for Other Services       Precautions / Restrictions Precautions Precautions: Fall;Other (comment) Precaution Comments: monitor HR/O2 Restrictions Weight Bearing Restrictions: No      Mobility Bed Mobility Overal bed mobility: Needs Assistance Bed Mobility: Supine to Sit     Supine to sit: Min assist, HOB elevated     General bed mobility comments: light handheld assist to lift trunk to EOB. Good initiation + bedrail use    Transfers Overall transfer level: Needs assistance Equipment used: Rolling walker (2 wheels) Transfers: Sit to/from Stand, Bed to chair/wheelchair/BSC Sit to Stand: Min guard     Step pivot transfers: Min assist     General transfer comment: Min A primarily for DME use d/t pt reaching around RW and leaving it behind. no overt LOB      Balance Overall balance assessment: Needs assistance Sitting-balance support: No upper extremity supported, Feet supported Sitting balance-Leahy Scale: Fair     Standing balance support: Bilateral upper extremity supported, During functional activity Standing balance-Leahy Scale: Poor                             ADL either performed or assessed with clinical judgement   ADL Overall ADL's : Needs assistance/impaired Eating/Feeding: Set up;Sitting   Grooming: Min guard;Standing   Upper Body Bathing: Set up;Sitting   Lower Body Bathing: Moderate assistance;Sitting/lateral leans;Sit to/from stand   Upper Body Dressing : Set up;Sitting   Lower Body  Dressing: Moderate assistance;Sitting/lateral leans;Sit to/from stand   Toilet Transfer: Minimal assistance;Stand-pivot;BSC/3in1;Rolling walker (2 wheels) Toilet Transfer  Details (indicate cue type and reason): cues for RW use as pt noted to leave it behind Toileting- Architect and Hygiene: Moderate assistance;Sitting/lateral lean;Sit to/from stand Toileting - Clothing Manipulation Details (indicate cue type and reason): assisted for peri care in standing after BM. pt able to assist with clothing mgmt and stand for duration of hygiene.       General ADL Comments: Pt with minor deficits increasing fall risk with ADLs with noted difficulty using RW and often reaching out around RW during transfers     Vision Baseline Vision/History: 1 Wears glasses Ability to See in Adequate Light: 0 Adequate Patient Visual Report: No change from baseline Vision Assessment?: No apparent visual deficits     Perception     Praxis      Pertinent Vitals/Pain Pain Assessment Pain Assessment: Faces Faces Pain Scale: Hurts a little bit Pain Location: bottom with peri care Pain Descriptors / Indicators: Sore, Grimacing Pain Intervention(s): Monitored during session, Other (comment) (NT present, placed bandage on small open sore on bottom)     Hand Dominance Right   Extremity/Trunk Assessment Upper Extremity Assessment Upper Extremity Assessment: Overall WFL for tasks assessed   Lower Extremity Assessment Lower Extremity Assessment: Defer to PT evaluation   Cervical / Trunk Assessment Cervical / Trunk Assessment: Kyphotic   Communication Communication Communication: HOH   Cognition Arousal/Alertness: Awake/alert Behavior During Therapy: WFL for tasks assessed/performed Overall Cognitive Status: History of cognitive impairments - at baseline                                 General Comments: hx of dementia, some word finding difficulties and obvious STM deficits. Pt able to report being at [facility] hospital due to "mind wasnt right", able to provide fairly detailed PLOF, follows directions consistently     General Comments  Noted small  (<dime sized) open sore on bottom. NT present and placed bandage. BM noted to have thick liquid texture    Exercises     Shoulder Instructions      Home Living Family/patient expects to be discharged to:: Private residence Living Arrangements: Spouse/significant other Available Help at Discharge: Family Type of Home: House Home Access: Stairs to enter Secretary/administrator of Steps: 1   Home Layout: One level     Bathroom Shower/Tub: Walk-in shower (4 inch lip)   Bathroom Toilet: Handicapped height Bathroom Accessibility: Yes   Home Equipment: Shower seat;Grab bars - tub/shower;Cane - single point;Rollator (4 wheels);Hand held shower head          Prior Functioning/Environment Prior Level of Function : Needs assist;History of Falls (last six months)       Physical Assist : ADLs (physical)   ADLs (physical): Bathing;IADLs;Dressing Mobility Comments: reports using Rollator, some recent falls (2involving missing stool seat at  puzzle table) ADLs Comments: Wife assists with med mgmt,IADLs. Reports intermittent assist needed for ADLs, wife present for shower transfers if pt dizzy. Per pt, they have been trying to get [facility] aide covered by [facility]        OT Problem List: Decreased activity tolerance;Impaired balance (sitting and/or standing);Decreased cognition;Decreased safety awareness;Decreased knowledge of use of DME or AE;Cardiopulmonary status limiting activity      OT Treatment/Interventions: Self-care/ADL training;Therapeutic exercise;Energy conservation;DME and/or AE instruction;Patient/family education;Therapeutic activities    OT Goals(Current goals can be found in  the care plan section) Acute Rehab OT Goals Patient Stated Goal: have some breakfast OT Goal Formulation: With patient Time For Goal Achievement: 01/16/22 Potential to Achieve Goals: Good  OT Frequency: Min 2X/week    Co-evaluation              AM-PAC OT "6 Clicks" Daily Activity     Outcome  Measure Help from another person eating meals?: A Little Help from another person taking care of personal grooming?: A Little Help from another person toileting, which includes using toliet, bedpan, or urinal?: A Little Help from another person bathing (including washing, rinsing, drying)?: A Lot Help from another person to put on and taking off regular upper body clothing?: A Little Help from another person to put on and taking off regular lower body clothing?: A Lot 6 Click Score: 16   End of Session Equipment Utilized During Treatment: Rolling walker (2 wheels);Oxygen Nurse Communication: Mobility status;Other (comment) (sore, BM consistency)  Activity Tolerance: Patient tolerated treatment well Patient left: in chair;with call bell/phone within reach;with chair alarm set;with nursing/sitter in room (NT present to assist with changing gown and setup for breakfast)  OT Visit Diagnosis: Unsteadiness on feet (R26.81);Other symptoms and signs involving cognitive function                Time: 6295-2841 OT Time Calculation (min): 28 min Charges:  OT General Charges $OT Visit: 1 Visit OT Evaluation $OT Eval Moderate Complexity: 1 Mod OT Treatments $Self Care/Home Management : 8-22 mins  Bradd Canary, OTR/L Acute Rehab Services Office: [phone]   Lorre Munroe 01/02/2022, 9:35 AM

## 2022-01-02 NOTE — Progress Notes (Signed)
[facility]  PROGRESS NOTE  Jeffery [name] XLK:[id number] DOB: [birth year] DOA: 12/30/2021 PCP: Frederica Kuster, MD   Brief HPI:   86 year old male with medical history of dementia, depression, CAD s/p PCI with stent placement in February 2021, essential hypertension, restrictive lung disease, chronic back pain status post spinal cord stimulator, BPH, who is admitted to [facility] on 12/30/2021 by way of transfer from [facility] emergency department with acute encephalopathy superimposed on dementia after presenting from home to the latter for evaluation of altered mental status. Patient has been confused for about 2 weeks.  At baseline he does have history of dementia.  He also was having visual hallucinations.    Subjective   Patient seen and examined, denies hallucinations.  Discussed with patient wife at bedside, patient has history of sleep apnea and is followed by pulmonology at [facility].  Patient has been off-and-on requiring oxygen for hypoxemia in the hospital.  Patient wife is not aware that he was diagnosed with restrictive lung disease.   Assessment/Plan:    Acute encephalopathy -Significantly improved -Likely from polypharmacy -Patient on multiple medication including Wellbutrin, Lexapro, Flexeril, Mirapex -Neurology consulted -TSH/ammonia normal -UDS was positive for THC-wife said 2 months ago she had given him Gummies from the hemp store -Neurology recommended psychiatry consultation for visual hallucinations -Psychiatry consulted -Patient started on low-dose Seroquel 25 mg nightly  Acute hypoxemic respiratory failure -Patient's O2 sats have been dropping to 88% on room air -He has been off-and-on requiring 2 L of oxygen via nasal cannula -Chest x-ray was unremarkable -Patient did go to [place] by air, and has been confused since then -We will obtain CTA chest to rule out pulmonary embolism or other lung pathology -Continue duo nebs nebulizers every  6 hours -Continue Pulmicort nebulizer twice daily, patient has been on Pulmicort at home  Depression -Home medications include Wellbutrin, Lexapro on hold  BPH -Continue tamsulosin   Hypertension -Continue hydralazine -amlodipine 10 mg daily restarted  Hypokalemia -Replete   Medications     amLODipine  10 mg Oral q morning   aspirin  81 mg Oral Daily   budesonide  0.5 mg Nebulization BID   colestipol  1 g Oral BID   hydrALAZINE  10 mg Oral Q8H   ipratropium-albuterol  3 mL Nebulization Once   latanoprost  1 drop Both Eyes QHS   QUEtiapine  25 mg Oral QHS   tamsulosin  0.4 mg Oral Daily   timolol  1 drop Both Eyes BID     Data Reviewed:   CBG:  No results for input(s): "GLUCAP" in the last 168 hours.  SpO2: 99 % O2 Flow Rate (L/min): 2 L/min    Vitals:   01/02/22 0843 01/02/22 0911 01/02/22 1355 01/02/22 1518  BP:  (!) 123/96 102/78 112/78  Pulse: (!) 106 100 (!) 40   Resp: (!) 23 18 18    Temp:  98 F (36.7 C) 98.1 F (36.7 C)   TempSrc:      SpO2: 96% (!) 88% 99%   Weight:      Height:          Data Reviewed:  Basic Metabolic Panel: Recent Labs  Lab 12/30/21 1316 12/31/21 0235 01/01/22 0212 01/02/22 0233  NA 143 142 139 142  K 3.7 3.6 3.2* 3.8  CL 108 109 108 110  CO2 25 23 20* 22  GLUCOSE 98 92 93 93  BUN 20 17 13 15   CREATININE 1.18 1.21 1.08 1.16  CALCIUM  9.6 9.2 8.8* 9.0  MG  --  1.8  --   --     CBC: Recent Labs  Lab 12/30/21 1316 12/31/21 0235 01/01/22 0212  WBC 6.6 7.1 7.1  NEUTROABS 4.7 4.9  --   HGB 14.5 14.8 15.0  HCT 43.6 43.9 44.1  MCV 94.4 95.0 92.8  PLT 161 151 161    LFT Recent Labs  Lab 12/30/21 1316 12/31/21 0235 01/02/22 0233  AST 30 28 37  ALT 48* 44 59*  ALKPHOS 58 65 70  BILITOT 1.0 1.2 1.4*  PROT 7.1 6.2* 6.1*  ALBUMIN 4.2 3.3* 3.2*     Antibiotics: Anti-infectives (From admission, onward)    None        DVT prophylaxis: SCDs  Code Status: Full code  Family Communication:  Discussed with patient's wife at bedside   CONSULTS neurology   Objective    Physical Examination:   General-appears in no acute distress Heart-S1-S2, regular, no murmur auscultated Lungs-clear to auscultation bilaterally, no wheezing or crackles auscultated Abdomen-soft, nontender, no organomegaly Extremities-no edema in the lower extremities Neuro-alert, oriented x3, no focal deficit noted   Status is: Inpatient: Acute encephalopathy    Pressure Injury 12/30/21 Buttocks Medial Stage 2 -  Partial thickness loss of dermis presenting as a shallow open injury with a red, pink wound bed without slough. (Active)  12/30/21 2100  Location: Buttocks  Location Orientation: Medial  Staging: Stage 2 -  Partial thickness loss of dermis presenting as a shallow open injury with a red, pink wound bed without slough.  Wound Description (Comments):   Present on Admission: Yes      Jeffery [name]   [facility] If 7PM-7AM, please contact night-coverage at [email or web address], Office  [phone]   01/02/2022, 5:19 PM  LOS: 3 days

## 2022-01-02 NOTE — Evaluation (Signed)
Physical Therapy Evaluation Patient Details Name: Jeffery [name] MRN: [id number] DOB: 1936-04-20 Today's Date: 01/02/2022  History of Present Illness  86 y/o male presented to ED on 12/30/21 for worsening confusion with hallucinations x 3 weeks. PMH: dementia, CAD s/p stent placement, HTN, restrictive lung disease, chronic back pain s/p spinal cord stimulator  Clinical Impression  Patient admitted with the above. PTA, patient lives with wife and was using rollator for mobility but per wife, has had repeated falls. Patient currently presents with weakness, impaired balance, decreased activity tolerance, and impaired cognition. Patient required minA for ambulation to/from bathroom with RW. At rest on RA, spO2 94% but with ambulation decreases to 81% requiring 2L O2 [place] to increase to 90%. Patient will benefit from skilled PT services during acute stay to address listed deficits. Wife reports difficulty managing patient at home due to falls, weakness, and current cognitive deficits. Wife requests rehab to improve patient's independence. Recommend SNF at discharge to maximize functional mobility and decrease burden of care.      Recommendations for follow up therapy are one component of a multi-disciplinary discharge planning process, led by the attending physician.  Recommendations may be updated based on patient status, additional functional criteria and insurance authorization.  Follow Up Recommendations Skilled nursing-short term rehab (<3 hours/day) Can patient physically be transported by private vehicle: Yes    Assistance Recommended at Discharge Frequent or constant Supervision/Assistance  Patient can return home with the following  A little help with walking and/or transfers;A little help with bathing/dressing/bathroom;Assistance with cooking/housework;Direct supervision/assist for medications management;Direct supervision/assist for financial management;Assist for transportation;Help with stairs  or ramp for entrance    Equipment Recommendations Rolling Jeylin Woodmansee (2 wheels)  Recommendations for Other Services       Functional Status Assessment Patient has had a recent decline in their functional status and demonstrates the ability to make significant improvements in function in a reasonable and predictable amount of time.     Precautions / Restrictions Precautions Precautions: Fall;Other (comment) Precaution Comments: monitor HR/O2 Restrictions Weight Bearing Restrictions: No      Mobility  Bed Mobility               General bed mobility comments: up in chair on arrival    Transfers Overall transfer level: Needs assistance Equipment used: Rolling Bryndan Bilyk (2 wheels) Transfers: Sit to/from Stand, Bed to chair/wheelchair/BSC Sit to Stand: Min guard           General transfer comment: min guard to stand from recliner for safety    Ambulation/Gait Ambulation/Gait assistance: Min assist Gait Distance (Feet): 10 Feet (+10') Assistive device: Rolling Anarely Nicholls (2 wheels) Gait Pattern/deviations: Step-through pattern, Decreased stride length, Trunk flexed Gait velocity: decreased     General Gait Details: minA for balance and DME management. Cues for keeping RW close to him with intermittent follow through  Stairs            Wheelchair Mobility    Modified Rankin (Stroke Patients Only)       Balance Overall balance assessment: Needs assistance Sitting-balance support: No upper extremity supported, Feet supported Sitting balance-Leahy Scale: Fair     Standing balance support: Bilateral upper extremity supported, During functional activity Standing balance-Leahy Scale: Poor                               Pertinent Vitals/Pain Pain Assessment Pain Assessment: Faces Faces Pain Scale: Hurts a little bit Pain Location: buttocks Pain  Descriptors / Indicators: Sore, Grimacing Pain Intervention(s): Monitored during session    Home  Living Family/patient expects to be discharged to:: Private residence Living Arrangements: Spouse/significant other Available Help at Discharge: Family Type of Home: House Home Access: Stairs to enter   Secretary/administrator of Steps: 1   Home Layout: One level Home Equipment: Shower seat;Grab bars - tub/shower;Cane - single point;Rollator (4 wheels);Hand held shower head      Prior Function Prior Level of Function : Needs assist;History of Falls (last six months)       Physical Assist : ADLs (physical)   ADLs (physical): Bathing;IADLs;Dressing Mobility Comments: reports using Rollator, some recent falls (2involving missing stool seat at  puzzle table) ADLs Comments: Wife assists with med mgmt,IADLs. Reports intermittent assist needed for ADLs, wife present for shower transfers if pt dizzy. Per pt, they have been trying to get [facility]     Hand Dominance   Dominant Hand: Right    Extremity/Trunk Assessment   Upper Extremity Assessment Upper Extremity Assessment: Defer to OT evaluation    Lower Extremity Assessment Lower Extremity Assessment: Generalized weakness    Cervical / Trunk Assessment Cervical / Trunk Assessment: Kyphotic  Communication   Communication: HOH  Cognition Arousal/Alertness: Awake/alert Behavior During Therapy: WFL for tasks assessed/performed Overall Cognitive Status: History of cognitive impairments - at baseline                                 General Comments: hx of dementia, some word finding difficulties and obvious STM deficits. Pt able to report being at [facility] hospital due to "mind wasnt right", able to provide fairly detailed PLOF, follows directions consistently        General Comments General comments (skin integrity, edema, etc.): On 2L O2 [place], spO2 98%. Removed with ability to maintain >94% resting. Ambulated to bathroom with drop to 81%. Donned 2L O2 [place] with increase to 90% and cues for pursed lip  breathing    Exercises     Assessment/Plan    PT Assessment Patient needs continued PT services  PT Problem List Decreased strength;Decreased activity tolerance;Decreased balance;Decreased mobility;Decreased cognition;Decreased knowledge of use of DME;Decreased safety awareness;Decreased knowledge of precautions;Cardiopulmonary status limiting activity       PT Treatment Interventions DME instruction;Gait training;Functional mobility training;Therapeutic exercise;Therapeutic activities;Balance training;Patient/family education    PT Goals (Current goals can be found in the Care Plan section)  Acute Rehab PT Goals Patient Stated Goal: to go home PT Goal Formulation: With family Time For Goal Achievement: 01/16/22 Potential to Achieve Goals: Fair    Frequency Min 2X/week     Co-evaluation               AM-PAC PT "6 Clicks" Mobility  Outcome Measure Help needed turning from your back to your side while in a flat bed without using bedrails?: A Little Help needed moving from lying on your back to sitting on the side of a flat bed without using bedrails?: A Little Help needed moving to and from a bed to a chair (including a wheelchair)?: A Little Help needed standing up from a chair using your arms (e.g., wheelchair or bedside chair)?: A Little Help needed to walk in hospital room?: A Lot Help needed climbing 3-5 steps with a railing? : Total 6 Click Score: 15    End of Session Equipment Utilized During Treatment: Gait belt;Oxygen Activity Tolerance: Patient tolerated treatment well  Patient left: in chair;with call bell/phone within reach;with chair alarm set;with family/visitor present Nurse Communication: Mobility status PT Visit Diagnosis: Unsteadiness on feet (R26.81);History of falling (Z91.81);Muscle weakness (generalized) (M62.81);Other abnormalities of gait and mobility (R26.89)    Time: 1937-9024 PT Time Calculation (min) (ACUTE ONLY): 29 min   Charges:   PT  Evaluation $PT Eval Moderate Complexity: 1 Mod PT Treatments $Therapeutic Activity: 8-22 mins        Garry Bochicchio A. Dan Humphreys PT, DPT Acute Rehabilitation Services Office [phone]   Viviann Spare 01/02/2022, 1:18 PM

## 2022-01-02 NOTE — Consult Note (Signed)
[facility] Face-to-Face Psychiatry Consult   Reason for Consult:  Per neuro: recommendation regarding hallucination as he is already on 2 psych meds ( lexapro and wellbutrin ) so I dont feel confident adding a third one  Referring Physician:  Dr. Benjamine Mola Patient Identification: Jeffery [name] MRN:  [id number] Principal Diagnosis: Acute encephalopathy Diagnosis:  Principal Problem:   Acute encephalopathy Active Problems:   Primary hypertension   Restrictive lung disease   BPH (benign prostatic hyperplasia)   Depression   Pressure injury of skin   Total Time spent with patient: 1 hour  Subjective:   Jeffery [name] is a 86 y.o. male patient admitted with acute encephalopathy superimposed on dementia.   Patient was seen for reassessment. Patient was observed sitting in a chair with an activity belt, working with physical therapy. Patient appeared motivated and ready to engage with therapy, as he is focused on returning home. He did appear to be tachycardiac and notable increased work of breath but remained determined to get up and go to the bathroom with minimal assistance. He is alert and oriented x3, he recalls this provider from yesterday. He is fairly groomed and appears to have been bathed. His mood is euthymic and his affect is congruent and appropriate. Writer was able to gain his attention but a few times during assessment ( he responded to his name being called, and when trying to redirect him). Patient appears to improving from when he originally presented to the ER. Per chart review he continues to have restless at time, however is improved with hands on care, ambulation and talking to the patient. Patient may benefit from skilled nursing rehab or assisted living facility. He denies having any visual hallucinations since this admission. He was started on Seroquel 25mg  po qhs yesterday, and denies any side effects or adverse reactions at this time. He further denies any suicidality, or imminent  danger to himself.  Will psych clear at this time, it is encouraged that we continue current medications and safety measures.  Patient is able to take care of self, however needs increase in social interaction and person centered care to help with worsening confusion, difficult dementia behavior, and agitation.      HPI:  86 year old male brought into the ER with chief complaint of acute mental status change.   Patient brought here by his wife and his daughter.  Patient has history of some memory impairment, but no dementia, hypertension, hyperlipidemia, depression.    According to the family, in the middle of June they went to [place] to visit patient's son.  While there, patient was disoriented.  He started having sleep disturbance and would wake up in the middle the night, questioning why things look different.  They chalked the change to change in scenery.  Once they got home however, patient continues to have cognitive decline.  Patient continues to wake up in the middle the night often.  He is also hallucinating and seeing things like son cleaning jars, mice in the house, cockroaches in the house.  In the middle the night he is found to be trying to clean the knife, when there is no knife in the bed.   Moreover, family has noted that patient has been having worsening gait.  He shuffles now when he walks and has had increased balance issues.  Patient has had 2 falls in the last week.  Past Psychiatric History: Denies although has a history of depression and anxiety for which she has taken Lexapro and Wellbutrin  at this time.  He is currently seeing his primary care provider who is prescribing both medications.  Denies any history of inpatient psychiatric hospitalization.  Denies any history of suicidal attempts.  Risk to Self: Denies Risk to Others: Denies Prior Inpatient Therapy: Denies Prior Outpatient Therapy: Denies  Past Medical History:  Past Medical History:  Diagnosis Date   Anxiety  and depression    BPH (benign prostatic hyperplasia)    Coronary artery disease    Fecal incontinence    GERD (gastroesophageal reflux disease)    Glaucoma    Hard of hearing    Headache    History of pneumonia    Hyperlipidemia    Hypertension    Memory impairment    Migraine headache    Neck fracture ([place])    2-3/2-21 followed by DR Ashok Croon in [place]    Nocturia    Osteoarthritis    Restrictive lung disease    Sleep apnea    wears CPAP does not know setting    Thyroid disease    Hyperparathyroidism    Urinary frequency    Urinary incontinence    Wears glasses     Past Surgical History:  Procedure Laterality Date   CARDIAC CATHETERIZATION     CIRCUMCISION     COLONOSCOPY     CORONARY ANGIOPLASTY     stents- 08/2019    ESOPHAGOGASTRODUODENOSCOPY     EYE SURGERY Bilateral    cataract   HAND SURGERY Right    HIP ARTHROPLASTY Right    HIP ARTHROPLASTY Left 2006   KNEE CARTILAGE SURGERY Bilateral    LUMBAR LAMINECTOMY     L3 - L4   RETINAL DETACHMENT SURGERY Right    x2   REVERSE SHOULDER ARTHROPLASTY Right 05/29/2016   Procedure: REVERSE SHOULDER ARTHROPLASTY;  Surgeon: Justice Britain, MD;  Location: [facility];  Service: Orthopedics;  Laterality: Right;   SHOULDER SURGERY Bilateral    TOTAL HIP REVISION Right 04/04/2020   Procedure: Right hip acetabular liner revision;  Surgeon: Gaynelle Arabian, MD;  Location: [facility];  Service: Orthopedics;  Laterality: Right;  35min   Family History: History reviewed. No pertinent family history. Family Psychiatric  History: Unable to assess Social History:  Social History   Substance and Sexual Activity  Alcohol Use Yes   Comment: occasional     Social History   Substance and Sexual Activity  Drug Use No    Social History   Socioeconomic History   Marital status: Married    Spouse name: Not on file   Number of children: Not on file   Years of education: Not on file   Highest education level: Not on file  Occupational  History   Not on file  Tobacco Use   Smoking status: Former    Passive exposure: Past   Smokeless tobacco: Never   Tobacco comments:    quit in approximately  2000  Vaping Use   Vaping Use: Never used  Substance and Sexual Activity   Alcohol use: Yes    Comment: occasional   Drug use: No   Sexual activity: Not Currently  Other Topics Concern   Not on file  Social History Narrative   Diet: Blank      Do you drink/ eat things with caffeine? Yes      Marital status: Married  What year were you married ? 1983      Do you live in a house, apartment,assistred living, condo, trailer, etc.)? House      Is it one or more stories? One Story      How many persons live in your home ? 2      Do you have any pets in your home ?(please list) No      Highest Level of education completed: GED      Current or past profession:  Retired Teaching laboratory technician, [employer], Self Employed      Do you exercise?  A Little                            Type & how often Blank      ADVANCED DIRECTIVES (Please bring copies)      Do you have a living will? Yes      Do you have a DNR form?   Yes                    If not, do you want to discuss one?       Do you have signed POA?HPOA forms? Yes                If so, please bring to your appointment      FUNCTIONAL STATUS- To be completed by Spouse / child / Staff       Do you have difficulty bathing or dressing yourself ? Yes ( Dressing at times)      Do you have difficulty preparing food or eating ? Yes (At times)      Do you have difficulty managing your mediation ? No      Do you have difficulty managing your finances ? No      Do you have difficulty affording your medication ? No      Social Determinants of Radio broadcast assistant Strain: Not on file  Food Insecurity: Not on file  Transportation Needs: Not on file  Physical Activity: Not on file  Stress: Not on file  Social Connections: Not on file    Additional Social History:    Allergies:  No Known Allergies  Labs:  Results for orders placed or performed during the hospital encounter of 12/30/21 (from the past 48 hour(s))  CBC     Status: None   Collection Time: 01/01/22  2:12 AM  Result Value Ref Range   WBC 7.1 4.0 - 10.5 K/uL   RBC 4.75 4.22 - 5.81 MIL/uL   Hemoglobin 15.0 13.0 - 17.0 g/dL   HCT 44.1 39.0 - 52.0 %   MCV 92.8 80.0 - 100.0 fL   MCH 31.6 26.0 - 34.0 pg   MCHC 34.0 30.0 - 36.0 g/dL   RDW 13.4 11.5 - 15.5 %   Platelets 161 150 - 400 K/uL   nRBC 0.0 0.0 - 0.2 %    Comment: Performed at [facility], [place] [address], [place], [place] [id number]  Basic metabolic panel     Status: Abnormal   Collection Time: 01/01/22  2:12 AM  Result Value Ref Range   Sodium 139 135 - 145 mmol/L   Potassium 3.2 (L) 3.5 - 5.1 mmol/L   Chloride 108 98 - 111 mmol/L   CO2 20 (L) 22 - 32 mmol/L   Glucose, Bld 93 70 - 99 mg/dL    Comment: Glucose reference  range applies only to samples taken after fasting for at least 8 hours.   BUN 13 8 - 23 mg/dL   Creatinine, Ser 1.08 0.61 - 1.24 mg/dL   Calcium 8.8 (L) 8.9 - 10.3 mg/dL   GFR, Estimated >60 >60 mL/min    Comment: (NOTE) Calculated using the CKD-EPI Creatinine Equation (2021)    Anion gap 11 5 - 15    Comment: Performed at [facility] [address] [place], [place] [address]  Comprehensive metabolic panel     Status: Abnormal   Collection Time: 01/02/22  2:33 AM  Result Value Ref Range   Sodium 142 135 - 145 mmol/L   Potassium 3.8 3.5 - 5.1 mmol/L   Chloride 110 98 - 111 mmol/L   CO2 22 22 - 32 mmol/L   Glucose, Bld 93 70 - 99 mg/dL    Comment: Glucose reference range applies only to samples taken after fasting for at least 8 hours.   BUN 15 8 - 23 mg/dL   Creatinine, Ser 1.16 0.61 - 1.24 mg/dL   Calcium 9.0 8.9 - 10.3 mg/dL   Total Protein 6.1 (L) 6.5 - 8.1 g/dL   Albumin 3.2 (L) 3.5 - 5.0 g/dL   AST 37 15 - 41 U/L   ALT 59 (H) 0 - 44 U/L    Alkaline Phosphatase 70 38 - 126 U/L   Total Bilirubin 1.4 (H) 0.3 - 1.2 mg/dL   GFR, Estimated >60 >60 mL/min    Comment: (NOTE) Calculated using the CKD-EPI Creatinine Equation (2021)    Anion gap 10 5 - 15    Comment: Performed at [facility], [place] [address], [place], [place] [address]    Current Facility-Administered Medications  Medication Dose Route Frequency Provider Last Rate Last Admin   acetaminophen (TYLENOL) tablet 650 mg  650 mg Oral Q6H PRN Howerter, Justin B, DO   650 mg at 01/02/22 1109   Or   acetaminophen (TYLENOL) suppository 650 mg  650 mg Rectal Q6H PRN Howerter, Justin B, DO       amLODipine (NORVASC) tablet 10 mg  10 mg Oral q morning Lama, Gagan S, MD   10 mg at 01/02/22 1100   aspirin chewable tablet 81 mg  81 mg Oral Daily Howerter, Justin B, DO   81 mg at 01/02/22 1100   budesonide (PULMICORT) nebulizer solution 0.5 mg  0.5 mg Nebulization BID Howerter, Justin B, DO   0.5 mg at 01/02/22 0843   hydrALAZINE (APRESOLINE) tablet 10 mg  10 mg Oral Q8H Vann, Jessica U, DO   10 mg at 01/02/22 0534   ipratropium-albuterol (DUONEB) 0.5-2.5 (3) MG/3ML nebulizer solution 3 mL  3 mL Nebulization Once [place], Marge Duncans, MD       ipratropium-albuterol (DUONEB) 0.5-2.5 (3) MG/3ML nebulizer solution 3 mL  3 mL Nebulization Q6H PRN Darrick Meigs, Marge Duncans, MD       latanoprost (XALATAN) 0.005 % ophthalmic solution 1 drop  1 drop Both Eyes QHS Howerter, Justin B, DO   1 drop at 01/01/22 2137   QUEtiapine (SEROQUEL) tablet 25 mg  25 mg Oral QHS Suella Broad, FNP   25 mg at 01/01/22 2135   tamsulosin (FLOMAX) capsule 0.4 mg  0.4 mg Oral Daily Howerter, Justin B, DO   0.4 mg at 01/02/22 1100   timolol (TIMOPTIC) 0.5 % ophthalmic solution 1 drop  1 drop Both Eyes BID Howerter, Justin B, DO   1 drop at 01/02/22 1100  Musculoskeletal: Strength & Muscle Tone: within normal limits Gait & Station: normal Patient leans: N/A            Psychiatric Specialty  Exam:  Presentation  General Appearance: Appropriate for Environment; Casual Eye Contact:Fair Speech:Clear and Coherent; Normal Rate (improved-dysarthia at baseline) Speech Volume:Normal Handedness:Right  Mood and Affect  Mood:Euthymic Affect:Appropriate; Congruent  Thought Process  Thought Processes:Coherent; Linear Descriptions of Associations:Intact  Orientation:Full (Time, Place and Person)  Thought Content:Logical  History of Schizophrenia/Schizoaffective disorder:No data recorded Duration of Psychotic Symptoms:No data recorded Hallucinations:Hallucinations: None Description of Visual Hallucinations: spiders, roaches, mouses and other things. I know they are not there  Ideas of Reference:None  Suicidal Thoughts:Suicidal Thoughts: No  Homicidal Thoughts:Homicidal Thoughts: No   Sensorium  Memory:Immediate Fair; Recent Fair; Remote Fair Judgment:Fair Insight:Fair  Executive Functions  Concentration:Fair Attention Span:Fair Recall:Fair Fund of Knowledge:Fair Language:Fair  Psychomotor Activity  Psychomotor Activity:Psychomotor Activity: Normal; Restlessness  Assets  Assets:Financial Resources/Insurance; Manufacturing systems engineer; Housing; Physical Health; Resilience; Social Support  Sleep  Sleep:Sleep: Fair  Physical Exam: Physical Exam Vitals reviewed.  Constitutional:      Appearance: Normal appearance. He is normal weight.  Cardiovascular:     Rate and Rhythm: Normal rate.  Abdominal:     General: Abdomen is flat.  Neurological:     General: No focal deficit present.     Mental Status: He is alert and oriented to person, place, and time. Mental status is at baseline.  Psychiatric:        Mood and Affect: Mood normal.        Behavior: Behavior normal.        Thought Content: Thought content normal.    ROS Blood pressure 102/78, pulse (!) 40, temperature 98.1 F (36.7 C), resp. rate 18, height 5\' 10"  (1.778 m), weight 114 kg, SpO2 99 %. Body  mass index is 36.06 kg/m.   Treatment Plan Summary: Plan   Will continue low-dose Seroquel 12.5-25 mg p.o. nightly to target visual hallucinations.  -Recommend to continue further work-up by neurology, to include outpatient neurology and rehabilitation.   -Continue delirium precautions, patient at high risk for developing delirium and acute hospital setting.   -Psychiatry will sign off at this time.   -Will continue to contact wife for collateral information. Disposition:  Psych cleared. No imminent danger to himself or others at this time.   07-30-1982, FNP 01/02/2022 3:17 PM

## 2022-01-02 NOTE — Progress Notes (Signed)
Subjective: No acute events overnight.  Patient states he slept well last night.  Per wife, he has not had any more hallucinations.  ROS: negative except above Examination  Vital signs in last 24 hours: Temp:  [98 F (36.7 C)-98.3 F (36.8 C)] 98 F (36.7 C) (07/06 0911) Pulse Rate:  [89-106] 100 (07/06 0911) Resp:  [18-27] 18 (07/06 0911) BP: (123-155)/(90-113) 123/96 (07/06 0911) SpO2:  [88 %-100 %] 88 % (07/06 0911)  General: lying in bed, NAD Neuro:  AOx3, CN 2-12 grossly intact, 5/5 in all extremities, FTN intact, sensory intct to light touch, reflex 2+ BL symmetric, no aphasia, able to tell me days of weeks backwards and serial subtraction once.   Basic Metabolic Panel: Recent Labs  Lab 12/26/21 1138 12/30/21 1316 12/31/21 0235 01/01/22 0212 01/02/22 0233  NA 139 143 142 139 142  K 4.0 3.7 3.6 3.2* 3.8  CL 104 108 109 108 110  CO2 24 25 23  20* 22  GLUCOSE 92 98 92 93 93  BUN 14 20 17 13 15   CREATININE 1.33* 1.18 1.21 1.08 1.16  CALCIUM 9.7 9.6 9.2 8.8* 9.0  MG  --   --  1.8  --   --     CBC: Recent Labs  Lab 12/30/21 1316 12/31/21 0235 01/01/22 0212  WBC 6.6 7.1 7.1  NEUTROABS 4.7 4.9  --   HGB 14.5 14.8 15.0  HCT 43.6 43.9 44.1  MCV 94.4 95.0 92.8  PLT 161 151 161     Coagulation Studies: Recent Labs    12/30/21 1316  LABPROT 13.8  INR 1.1    Imaging MRI brain with and without contrast 01/01/2022: Negative for acute infarct. Atrophy and chronic microvascular ischemic change    ASSESSMENT AND PLAN: 85yo M with dementia brought in with encephalopathy which has gradually improved.   Acute encephalopathy, improved Visual hallucinations Hypoproteinemia and hypoalbuminemia - suspected delirium in setting of underlying dementia (? Lewy body) as well as polypharmacy (on multiple sedating meds)   Recommendations: - Psych was consulted for visual hallucinations and recommended adding Seroquel 25 mg at night.  Recommended stopping Lexapro and  Wellbutrin -Recommend follow-up with neuropsych for neurocognitive evaluation as well as follow-up with neurology asap - continue delirium precautions - discussed plan with patient, wife and Dr 03/04/2022  I have spent a total of  26 minutes with the patient reviewing hospital notes,  test results, labs and examining the patient as well as establishing an assessment and plan that was discussed personally with the patient.  > 50% of time was spent in direct patient care.   86yo Epilepsy [facility] For questions after 5pm please refer to AMION to reach the Neurologist on call

## 2022-01-03 ENCOUNTER — Inpatient Hospital Stay (HOSPITAL_COMMUNITY): Payer: Medicare HMO

## 2022-01-03 DIAGNOSIS — F32A Depression, unspecified: Secondary | ICD-10-CM | POA: Diagnosis not present

## 2022-01-03 DIAGNOSIS — I10 Essential (primary) hypertension: Secondary | ICD-10-CM | POA: Diagnosis not present

## 2022-01-03 LAB — CBC
HCT: 45.1 % (ref 39.0–52.0)
Hemoglobin: 15 g/dL (ref 13.0–17.0)
MCH: 31.6 pg (ref 26.0–34.0)
MCHC: 33.3 g/dL (ref 30.0–36.0)
MCV: 95.1 fL (ref 80.0–100.0)
Platelets: 150 10*3/uL (ref 150–400)
RBC: 4.74 MIL/uL (ref 4.22–5.81)
RDW: 13.8 % (ref 11.5–15.5)
WBC: 9.4 10*3/uL (ref 4.0–10.5)
nRBC: 0 % (ref 0.0–0.2)

## 2022-01-03 LAB — BASIC METABOLIC PANEL
Anion gap: 10 (ref 5–15)
BUN: 24 mg/dL — ABNORMAL HIGH (ref 8–23)
CO2: 20 mmol/L — ABNORMAL LOW (ref 22–32)
Calcium: 8.7 mg/dL — ABNORMAL LOW (ref 8.9–10.3)
Chloride: 109 mmol/L (ref 98–111)
Creatinine, Ser: 1.46 mg/dL — ABNORMAL HIGH (ref 0.61–1.24)
GFR, Estimated: 47 mL/min — ABNORMAL LOW (ref >60)
Glucose, Bld: 98 mg/dL (ref 70–99)
Potassium: 3.7 mmol/L (ref 3.5–5.1)
Sodium: 139 mmol/L (ref 135–145)

## 2022-01-03 LAB — METHYLMALONIC ACID(MMA), RND URINE
Creatinine(Crt), U: 1.39 g/L (ref 0.30–3.00)
MMA - Normalized: 1.1 umol/mmol cr (ref 0.3–2.8)
Methylmalonic Acid, Ur: 13.7 umol/L (ref 1.6–29.7)

## 2022-01-03 LAB — MAGNESIUM: Magnesium: 1.9 mg/dL (ref 1.7–2.4)

## 2022-01-03 MED ORDER — IOHEXOL 350 MG/ML SOLN
75.0000 mL | Freq: Once | INTRAVENOUS | Status: AC | PRN
  Administered 2022-01-03: 75 mL via INTRAVENOUS

## 2022-01-03 MED ORDER — SODIUM CHLORIDE 0.9 % IV BOLUS
250.0000 mL | Freq: Once | INTRAVENOUS | Status: AC
  Administered 2022-01-03: 250 mL via INTRAVENOUS

## 2022-01-03 MED ORDER — SODIUM CHLORIDE 0.9 % IV SOLN: INTRAVENOUS | Status: AC

## 2022-01-03 NOTE — TOC Initial Note (Signed)
Transition of Care [facility]) - Initial/Assessment Note    Patient Details  Name: Jeffery [name] MRN: [id number] Date of Birth: Sep 13, 1935  [facility] CM/SW Contact:    Joanne Chars, LCSW Phone Number: 01/03/2022, 1:33 PM  Clinical Narrative:       Pt oriented x2.  CSW met with pt wife Cleda Clarks in room.  She is aware of recommendation for SNF and in agreement.  Choice document provided, her first choice would be [facility].  Permission given to send out referral in hub.  Pt lives at home with wife, no current services.  Pt is vaccinated for covid with multiple boosters.  Referral sent out in hub for SNF.            Expected Discharge Plan: Skilled Nursing Facility Barriers to Discharge: Continued Medical Work up, SNF Pending bed offer   Patient Goals and CMS Choice   CMS Medicare.gov Compare Post Acute Care list provided to:: Patient Represenative (must comment) (wife Cleda Clarks) Choice offered to / list presented to : Spouse  Expected Discharge Plan and Services Expected Discharge Plan: [place] In-house Referral: Clinical Social Work   Post Acute Care Choice: [place] Living arrangements for the past 2 months: [place]                                      Prior Living Arrangements/Services Living arrangements for the past 2 months: Single Family Home Lives with:: Spouse Patient language and need for interpreter reviewed:: No        Need for Family Participation in Patient Care: Yes (Comment) Care giver support system in place?: Yes (comment) Current home services: Other (comment) (none) Criminal Activity/Legal Involvement Pertinent to Current Situation/Hospitalization: No - Comment as needed  Activities of Daily Living      Permission Sought/Granted                  Emotional Assessment Appearance:: Appears stated age Attitude/Demeanor/Rapport: Unable to Assess Affect (typically observed):  Unable to Assess Orientation: : Oriented to Self, Oriented to Place Alcohol / Substance Use: Not Applicable Psych Involvement: Outpatient Provider  Admission diagnosis:  Acute metabolic encephalopathy [J62.83] Acute alteration in mental status [R41.82] Patient Active Problem List   Diagnosis Date Noted   Pressure injury of skin 66/29/4765   Acute metabolic encephalopathy 46/50/3546   Acute encephalopathy 12/30/2021   Depression 12/30/2021   BPH (benign prostatic hyperplasia)    Obesity (BMI 30-39.9) 07/29/2020   Chronic right shoulder pain 07/29/2020   Moderate episode of recurrent major depressive disorder ([place]) 07/29/2020   OSA on CPAP 07/29/2020   Dizziness 07/29/2020   Former cigar smoker 07/29/2020   Erectile dysfunction 07/29/2020   Generalized anxiety disorder 07/29/2020   Primary hyperparathyroidism ([place]) 07/29/2020   Restrictive lung disease    Primary hypertension 07/24/2020   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 [place] [address] Phone: [phone] Fax: [phone]  [facility] [address] [place] [address] Phone: [phone] Fax: [phone]  Social Determinants of Health (SDOH) Interventions    Readmission Risk Interventions     No data to display           

## 2022-01-03 NOTE — [place] FL2 (Signed)
[place] MEDICAID FL2 LEVEL OF CARE SCREENING TOOL     IDENTIFICATION  Patient Name: Jeffery [name] Birthdate: Nov 17, 1935 Sex: male Admission Date (Current Location): 12/30/2021  [facility] and [place] Number:  Producer, television/film/video and Address:  [facility], [address], [place], [place] [address]      Provider Number: [id number]  Attending Physician Name and Address:  Meredeth Ide, MD  Relative Name and Phone Number:  Broden, Holt [phone]  [phone]    Current Level of Care: Hospital Recommended Level of Care: Skilled Nursing Facility Prior Approval Number:    Date Approved/Denied:   PASRR Number: [id number]  Discharge Plan: SNF    Current Diagnoses: Patient Active Problem List   Diagnosis Date Noted   Pressure injury of skin 12/31/2021   Acute metabolic encephalopathy 12/30/2021   Acute encephalopathy 12/30/2021   Depression 12/30/2021   BPH (benign prostatic hyperplasia)    Obesity (BMI 30-39.9) 07/29/2020   Chronic right shoulder pain 07/29/2020   Moderate episode of recurrent major depressive disorder (HCC) 07/29/2020   OSA on CPAP 07/29/2020   Dizziness 07/29/2020   Former cigar smoker 07/29/2020   Erectile dysfunction 07/29/2020   Generalized anxiety disorder 07/29/2020   Primary hyperparathyroidism (HCC) 07/29/2020   Restrictive lung disease    Primary hypertension 07/24/2020   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HCC) 04/04/2020   S/P reverse total shoulder arthroplasty, 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    Orientation RESPIRATION BLADDER Height & Weight     Self, Place  O2 Incontinent Weight: 251 lb 5.2 oz (114 kg) Height:  5\' 10"  (177.8 cm)  BEHAVIORAL SYMPTOMS/MOOD NEUROLOGICAL BOWEL NUTRITION STATUS      Continent Diet (see discharge summary)  AMBULATORY STATUS COMMUNICATION OF NEEDS Skin   Limited Assist Verbally Other (Comment) (redness)                       Personal Care Assistance Level of  Assistance  Bathing, Feeding, Dressing Bathing Assistance: Limited assistance Feeding assistance: Limited assistance Dressing Assistance: Limited assistance     Functional Limitations Info  Sight, Hearing, Speech Sight Info: Adequate Hearing Info: Adequate Speech Info: Adequate    SPECIAL CARE FACTORS FREQUENCY  PT (By licensed PT), OT (By licensed OT)     PT Frequency: 5x week OT Frequency: 5x week            Contractures Contractures Info: Not present    Additional Factors Info  Code Status, Allergies Code Status Info: full Allergies Info: NKA           Current Medications (01/03/2022):  This is the current hospital active medication list Current Facility-Administered Medications  Medication Dose Route Frequency Provider Last Rate Last Admin   0.9 %  sodium chloride infusion   Intravenous Continuous 03/06/2022, Sharl Ma, MD       acetaminophen (TYLENOL) tablet 650 mg  650 mg Oral Q6H PRN Howerter, Justin B, DO   650 mg at 01/02/22 2141   Or   acetaminophen (TYLENOL) suppository 650 mg  650 mg Rectal Q6H PRN Howerter, Justin B, DO       aspirin chewable tablet 81 mg  81 mg Oral Daily Howerter, Justin B, DO   81 mg at 01/03/22 1104   budesonide (PULMICORT) nebulizer solution 0.5 mg  0.5 mg Nebulization BID Howerter, Justin B, DO   0.5 mg at 01/03/22 0843   colestipol (COLESTID) tablet 1 g  1 g Oral BID 03/06/22, Gagan  S, MD   1 g at 01/03/22 1104   ipratropium-albuterol (DUONEB) 0.5-2.5 (3) MG/3ML nebulizer solution 3 mL  3 mL Nebulization Once [place], Sarina Ill, MD       ipratropium-albuterol (DUONEB) 0.5-2.5 (3) MG/3ML nebulizer solution 3 mL  3 mL Nebulization Q6H PRN Sharl Ma, Sarina Ill, MD       latanoprost (XALATAN) 0.005 % ophthalmic solution 1 drop  1 drop Both Eyes QHS Howerter, Justin B, DO   1 drop at 01/02/22 2142   QUEtiapine (SEROQUEL) tablet 25 mg  25 mg Oral QHS Maryagnes Amos, FNP   25 mg at 01/02/22 2142   tamsulosin (FLOMAX) capsule 0.4 mg  0.4 mg Oral Daily  Howerter, Justin B, DO   0.4 mg at 01/03/22 1104   timolol (TIMOPTIC) 0.5 % ophthalmic solution 1 drop  1 drop Both Eyes BID Howerter, Justin B, DO   1 drop at 01/03/22 1106     Discharge Medications: Please see discharge summary for a list of discharge medications.  Relevant Imaging Results:  Relevant Lab Results:   Additional Information SSN: [id number], pt is vaccinated for covid with multiple boosters.  Lorri Frederick, LCSW

## 2022-01-03 NOTE — Plan of Care (Signed)

## 2022-01-03 NOTE — Progress Notes (Addendum)
[facility]  PROGRESS NOTE  Jeffery [name] MWN:[id number] DOB: 07/01/1935 DOA: 12/30/2021 PCP: Frederica Kuster, MD   Brief HPI:   86 year old male with medical history of dementia, depression, CAD s/p PCI with stent placement in February 2021, essential hypertension, restrictive lung disease, chronic back pain status post spinal cord stimulator, BPH, who is admitted to [facility] on 12/30/2021 by way of transfer from [facility] emergency department with acute encephalopathy superimposed on dementia after presenting from home to the latter for evaluation of altered mental status. Patient has been confused for about 2 weeks.  At baseline he does have history of dementia.  He also was having visual hallucinations.    Subjective   Patient seen and examined, CT chest was unremarkable for PE.  Did not show  restrictive lung disease.  Showed multiple renal cysts.   Assessment/Plan:    Acute encephalopathy -Significantly improved -Likely from polypharmacy -Patient on multiple medication including Wellbutrin, Lexapro, Flexeril, Mirapex -Neurology consulted -TSH/ammonia normal -UDS was positive for THC-wife said 2 months ago she had given him Gummies from the hemp store -Neurology recommended psychiatry consultation for visual hallucinations -Psychiatry consulted -Patient started on low-dose Seroquel 25 mg nightly  Acute kidney injury -Creatinine went up to 1.46 -Likely from hypotension -Patient also received IV contrast for CTA chest yesterday.?  Contrast-induced injury -We will start IV normal saline at 75 mill per hour -Follow BMP in am  Renal cysts -Noted incidentally on CT chest -Renal ultrasound obtained shows multiple renal cysts -Outpatient follow-up  Acute hypoxemic respiratory failure -Patient's O2 sats have been dropping to 88% on room air -He has been off-and-on requiring 2 L of oxygen via nasal cannula -Chest x-ray was unremarkable -Patient did go to [place] by air, and has been confused since then -CT chest obtained was negative for PE -Continue duo nebs nebulizers every 6 hours -Continue Pulmicort nebulizer twice daily, patient has been on Pulmicort at home  Depression -Home medications include Wellbutrin, Lexapro on hold  BPH -Continue tamsulosin  Hypertension -Patient blood pressure dropped this morning -250 cc normal saline bolus given x 1 -We will hold hydralazine and amlodipine -Start IV normal saline at 75 mill per hour  Hypokalemia -Replete   Medications     aspirin  81 mg Oral Daily   budesonide  0.5 mg Nebulization BID   colestipol  1 g Oral BID   ipratropium-albuterol  3 mL Nebulization Once   latanoprost  1 drop Both Eyes QHS   QUEtiapine  25 mg Oral QHS   tamsulosin  0.4 mg Oral Daily   timolol  1 drop Both Eyes BID     Data Reviewed:   CBG:  No results for input(s): "GLUCAP" in the last 168 hours.  SpO2: 95 % O2 Flow Rate (L/min): 3 L/min    Vitals:   01/03/22 0838 01/03/22 0844 01/03/22 1100 01/03/22 1155  BP: 108/74  116/76 93/75  Pulse: 83  88 79  Resp: 20  15 18   Temp:      TempSrc:      SpO2: 94% 94% 99% 95%  Weight:      Height:          Data Reviewed:  Basic Metabolic Panel: Recent Labs  Lab 12/30/21 1316 12/31/21 0235 01/01/22 0212 01/02/22 0233 01/03/22 0146  NA 143 142 139 142 139  K 3.7 3.6 3.2* 3.8 3.7  CL 108 109 108 110 109  CO2 25 23 20* 22 20*  GLUCOSE  98 92 93 93 98  BUN 20 17 13 15  24*  CREATININE 1.18 1.21 1.08 1.16 1.46*  CALCIUM 9.6 9.2 8.8* 9.0 8.7*  MG  --  1.8  --   --  1.9    CBC: Recent Labs  Lab 12/30/21 1316 12/31/21 0235 01/01/22 0212 01/03/22 0146  WBC 6.6 7.1 7.1 9.4  NEUTROABS 4.7 4.9  --   --   HGB 14.5 14.8 15.0 15.0  HCT 43.6 43.9 44.1 45.1  MCV 94.4 95.0 92.8 95.1  PLT 161 151 161 150    LFT Recent Labs  Lab 12/30/21 1316 12/31/21 0235 01/02/22 0233  AST 30 28 37  ALT 48* 44 59*  ALKPHOS 58 65 70  BILITOT 1.0 1.2  1.4*  PROT 7.1 6.2* 6.1*  ALBUMIN 4.2 3.3* 3.2*     Antibiotics: Anti-infectives (From admission, onward)    None        DVT prophylaxis: SCDs  Code Status: Full code  Family Communication: Discussed with patient's wife at bedside   CONSULTS neurology   Objective    Physical Examination:   General-appears in no acute distress Heart-S1-S2, regular, no murmur auscultated Lungs-clear to auscultation bilaterally, no wheezing or crackles auscultated Abdomen-soft, nontender, no organomegaly Extremities-no edema in the lower extremities Neuro-alert, oriented x3, no focal deficit noted  Status is: Inpatient: Acute encephalopathy    Pressure Injury 12/30/21 Buttocks Medial Stage 2 -  Partial thickness loss of dermis presenting as a shallow open injury with a red, pink wound bed without slough. (Active)  12/30/21 2100  Location: Buttocks  Location Orientation: Medial  Staging: Stage 2 -  Partial thickness loss of dermis presenting as a shallow open injury with a red, pink wound bed without slough.  Wound Description (Comments):   Present on Admission: Yes      Jaxie Racanelli S Tiaja Hagan   [facility] If 7PM-7AM, please contact night-coverage at [email or web address], Office  [phone]   01/03/2022, 4:55 PM  LOS: 4 days

## 2022-01-03 NOTE — Progress Notes (Signed)
Patient transported via bed by transporter to renal ultrasound.

## 2022-01-04 DIAGNOSIS — I10 Essential (primary) hypertension: Secondary | ICD-10-CM | POA: Diagnosis not present

## 2022-01-04 DIAGNOSIS — F32A Depression, unspecified: Secondary | ICD-10-CM | POA: Diagnosis not present

## 2022-01-04 LAB — BASIC METABOLIC PANEL
Anion gap: 10 (ref 5–15)
BUN: 23 mg/dL (ref 8–23)
CO2: 20 mmol/L — ABNORMAL LOW (ref 22–32)
Calcium: 9 mg/dL (ref 8.9–10.3)
Chloride: 112 mmol/L — ABNORMAL HIGH (ref 98–111)
Creatinine, Ser: 1.17 mg/dL (ref 0.61–1.24)
GFR, Estimated: >60 mL/min (ref >60)
Glucose, Bld: 109 mg/dL — ABNORMAL HIGH (ref 70–99)
Potassium: 4 mmol/L (ref 3.5–5.1)
Sodium: 142 mmol/L (ref 135–145)

## 2022-01-04 MED ORDER — FAMOTIDINE 20 MG PO TABS
20.0000 mg | ORAL_TABLET | Freq: Every day | ORAL | Status: DC
  Administered 2022-01-04 – 2022-01-07 (×4): 20 mg via ORAL
  Filled 2022-01-04 (×4): qty 1

## 2022-01-04 MED ORDER — ENOXAPARIN SODIUM 40 MG/0.4ML IJ SOSY
40.0000 mg | PREFILLED_SYRINGE | INTRAMUSCULAR | Status: DC
  Administered 2022-01-04 – 2022-01-06 (×3): 40 mg via SUBCUTANEOUS
  Filled 2022-01-04 (×3): qty 0.4

## 2022-01-04 NOTE — Progress Notes (Signed)
[facility]  PROGRESS NOTE  Rachael Zapanta IEP:[id number] DOB: 09/12/1935 DOA: 12/30/2021 PCP: Frederica Kuster, MD   Brief HPI:   86 year old male with medical history of dementia, depression, CAD s/p PCI with stent placement in February 2021, essential hypertension, restrictive lung disease, chronic back pain status post spinal cord stimulator, BPH, who is admitted to [facility] on 12/30/2021 by way of transfer from [facility] emergency department with acute encephalopathy superimposed on dementia after presenting from home to the latter for evaluation of altered mental status. Patient has been confused for about 2 weeks.  At baseline he does have history of dementia.  He also was having visual hallucinations.    Subjective   Patient seen and examined, feels better this morning.  Renal function has improved with IV hydration.  Blood pressure is stable.   Assessment/Plan:    Acute encephalopathy -Significantly improved -Likely from polypharmacy, underlying sleep apnea/obesity hypoventilation syndrome -Patient on multiple medication including Wellbutrin, Lexapro, Flexeril, Mirapex -Neurology consulted -TSH/ammonia normal -UDS was positive for THC-wife said 2 months ago she had given him Gummies from the hemp store -Neurology recommended psychiatry consultation for visual hallucinations -Psychiatry consulted -Patient started on low-dose Seroquel 25 mg nightly -Continue oxygen via nasal cannula to keep O2 sats more than 92%  Acute kidney injury -Creatinine went up to 1.46 -Likely from hypotension -Started on IV fluids with normal saline -Resolved, today creatinine is down to 1.17 -Patient also received IV contrast for CTA chest yesterday.?  Contrast-induced injury -Follow BMP in am  Renal cysts -Noted incidentally on CT chest -Renal ultrasound obtained shows multiple renal cysts -Discussed with nephrology; no intervention recommended -Outpatient  follow-up  Acute hypoxemic respiratory failure -Patient's O2 sats have been dropping to 88% on room air -He has been off-and-on requiring 2 L of oxygen via nasal cannula -Chest x-ray was unremarkable -Patient did go to [place] by air, and has been confused since then -CT chest obtained was negative for PE; showed marked volume loss in the lung ,left lower lobe; likely from severe scoliosis.  Discussed with pulmonology, no further intervention recommended.  Continue incentive spirometry -Continue duo nebs nebulizers every 6 hours -Continue Pulmicort nebulizer twice daily, patient has been on Pulmicort at home  Depression -Home medications include Wellbutrin, Lexapro on hold  BPH -Continue tamsulosin  Hypertension -Patient blood pressure dropped yesterday -250 cc normal saline bolus given x 1 - hydralazine and amlodipine are currently on hold   Hypokalemia -Replete   Medications     aspirin  81 mg Oral Daily   budesonide  0.5 mg Nebulization BID   colestipol  1 g Oral BID   famotidine  20 mg Oral Daily   ipratropium-albuterol  3 mL Nebulization Once   latanoprost  1 drop Both Eyes QHS   QUEtiapine  25 mg Oral QHS   tamsulosin  0.4 mg Oral Daily   timolol  1 drop Both Eyes BID     Data Reviewed:   CBG:  No results for input(s): "GLUCAP" in the last 168 hours.  SpO2: 94 % O2 Flow Rate (L/min): 3 L/min    Vitals:   01/03/22 2227 01/04/22 0500 01/04/22 0510 01/04/22 0723  BP: (!) 134/99  117/79   Pulse: 96  67   Resp: 18  14   Temp: 97.9 F (36.6 C)  97.8 F (36.6 C)   TempSrc: Oral  Axillary   SpO2: 98%  91% 94%  Weight:  114.6 kg    Height:  Data Reviewed:  Basic Metabolic Panel: Recent Labs  Lab 12/31/21 0235 01/01/22 0212 01/02/22 0233 01/03/22 0146 01/04/22 0144  NA 142 139 142 139 142  K 3.6 3.2* 3.8 3.7 4.0  CL 109 108 110 109 112*  CO2 23 20* 22 20* 20*  GLUCOSE 92 93 93 98 109*  BUN 17 13 15  24* 23  CREATININE 1.21 1.08  1.16 1.46* 1.17  CALCIUM 9.2 8.8* 9.0 8.7* 9.0  MG 1.8  --   --  1.9  --     CBC: Recent Labs  Lab 12/30/21 1316 12/31/21 0235 01/01/22 0212 01/03/22 0146  WBC 6.6 7.1 7.1 9.4  NEUTROABS 4.7 4.9  --   --   HGB 14.5 14.8 15.0 15.0  HCT 43.6 43.9 44.1 45.1  MCV 94.4 95.0 92.8 95.1  PLT 161 151 161 150    LFT Recent Labs  Lab 12/30/21 1316 12/31/21 0235 01/02/22 0233  AST 30 28 37  ALT 48* 44 59*  ALKPHOS 58 65 70  BILITOT 1.0 1.2 1.4*  PROT 7.1 6.2* 6.1*  ALBUMIN 4.2 3.3* 3.2*     Antibiotics: Anti-infectives (From admission, onward)    None        DVT prophylaxis: SCDs/start Lovenox for DVT prophylaxis  Code Status: Full code  Family Communication: Discussed with patient's wife at bedside   CONSULTS neurology   Objective    Physical Examination:   General-appears in no acute distress Heart-S1-S2, regular, no murmur auscultated Lungs-clear to auscultation bilaterally, no wheezing or crackles auscultated Abdomen-soft, nontender, no organomegaly Extremities-no edema in the lower extremities Neuro-alert, oriented x3, no focal deficit noted  Status is: Inpatient: Acute encephalopathy    Pressure Injury 12/30/21 Buttocks Medial Stage 2 -  Partial thickness loss of dermis presenting as a shallow open injury with a red, pink wound bed without slough. (Active)  12/30/21 2100  Location: Buttocks  Location Orientation: Medial  Staging: Stage 2 -  Partial thickness loss of dermis presenting as a shallow open injury with a red, pink wound bed without slough.  Wound Description (Comments):   Present on Admission: Yes      Wilburta Milbourn S Saquoia Sianez   [facility] If 7PM-7AM, please contact night-coverage at [email or web address], Office  [phone]   01/04/2022, 2:15 PM  LOS: 5 days

## 2022-01-04 NOTE — Plan of Care (Signed)
  Problem: Clinical Measurements: Goal: Will remain free from infection Outcome: Progressing   Problem: Clinical Measurements: Goal: Diagnostic test results will improve Outcome: Progressing   Problem: Clinical Measurements: Goal: Respiratory complications will improve Outcome: Progressing   

## 2022-01-05 DIAGNOSIS — F32A Depression, unspecified: Secondary | ICD-10-CM | POA: Diagnosis not present

## 2022-01-05 DIAGNOSIS — N401 Enlarged prostate with lower urinary tract symptoms: Secondary | ICD-10-CM | POA: Diagnosis not present

## 2022-01-05 DIAGNOSIS — I10 Essential (primary) hypertension: Secondary | ICD-10-CM | POA: Diagnosis not present

## 2022-01-05 DIAGNOSIS — R351 Nocturia: Secondary | ICD-10-CM | POA: Diagnosis not present

## 2022-01-05 LAB — GLUCOSE, CAPILLARY: Glucose-Capillary: 103 mg/dL — ABNORMAL HIGH (ref 70–99)

## 2022-01-05 MED ORDER — COLESTIPOL HCL 1 G PO TABS
2.0000 g | ORAL_TABLET | Freq: Two times a day (BID) | ORAL | Status: DC
  Administered 2022-01-05 – 2022-01-07 (×4): 2 g via ORAL
  Filled 2022-01-05 (×5): qty 2

## 2022-01-05 NOTE — Progress Notes (Signed)
[facility]  PROGRESS NOTE  Jeffery [name] MGQ:[id number] DOB: 1936-05-04 DOA: 12/30/2021 PCP: Frederica Kuster, MD   Brief HPI:   86 year old male with medical history of dementia, depression, CAD s/p PCI with stent placement in February 2021, essential hypertension, restrictive lung disease, chronic back pain status post spinal cord stimulator, BPH, who is admitted to [facility] on 12/30/2021 by way of transfer from [facility] emergency department with acute encephalopathy superimposed on dementia after presenting from home to the latter for evaluation of altered mental status. Patient has been confused for about 2 weeks.  At baseline he does have history of dementia.  He also was having visual hallucinations.    Subjective   Patient denies shortness of breath.   Assessment/Plan:   Acute encephalopathy -Significantly improved -Likely from polypharmacy, underlying sleep apnea/obesity hypoventilation syndrome -Patient on multiple medication including Wellbutrin, Lexapro, Flexeril, Mirapex -Neurology consulted -TSH/ammonia normal -UDS was positive for THC-wife said 2 months ago she had given him Gummies from the hemp store -Neurology recommended psychiatry consultation for visual hallucinations -Psychiatry consulted -Patient started on low-dose Seroquel 25 mg nightly -Continue oxygen via nasal cannula to keep O2 sats more than 92%  Acute kidney injury -Creatinine went up to 1.46 -Likely from hypotension -Patient also received IV contrast for CTA chest yesterday?  contrast-induced injury -Started on IV fluids with normal saline -Resolved, today creatinine is down to 1.17  Renal cysts -Noted incidentally on CT chest -Renal ultrasound obtained shows multiple renal cysts -Discussed with nephrology; no intervention recommended -Outpatient follow-up  Acute hypoxemic respiratory failure -Patient's O2 sats have been dropping to 88% on room air -He has been off-and-on  requiring 2 L of oxygen via nasal cannula -Chest x-ray was unremarkable -Patient did go to [place] by air, and has been confused since then -CT chest obtained was negative for PE; showed marked volume loss in the lung ,left lower lobe; likely from severe scoliosis.  Discussed with pulmonology, no further intervention recommended.  Continue incentive spirometry -Continue duo nebs nebulizers every 6 hours -Continue Pulmicort nebulizer twice daily, patient has been on Pulmicort at home  Obstructive sleep apnea -Start CPAP nightly  Depression -Home medications include Wellbutrin, Lexapro on hold  BPH -Continue tamsulosin  Hypertension -Patient blood pressure dropped yesterday -250 cc normal saline bolus given x 1 - hydralazine and amlodipine are currently on hold   Hypokalemia -Replete   Medications     aspirin  81 mg Oral Daily   budesonide  0.5 mg Nebulization BID   colestipol  1 g Oral BID   enoxaparin (LOVENOX) injection  40 mg Subcutaneous Q24H   famotidine  20 mg Oral Daily   ipratropium-albuterol  3 mL Nebulization Once   latanoprost  1 drop Both Eyes QHS   QUEtiapine  25 mg Oral QHS   tamsulosin  0.4 mg Oral Daily   timolol  1 drop Both Eyes BID     Data Reviewed:   CBG:  No results for input(s): "GLUCAP" in the last 168 hours.  SpO2: 97 % O2 Flow Rate (L/min): 3 L/min    Vitals:   01/05/22 0434 01/05/22 0500 01/05/22 0818 01/05/22 0834  BP: 117/78   (!) 130/94  Pulse: 63   70  Resp: 12   14  Temp: 97.7 F (36.5 C)   97.9 F (36.6 C)  TempSrc: Oral   Oral  SpO2: 95%  96% 97%  Weight:  113.6 kg    Height:  Data Reviewed:  Basic Metabolic Panel: Recent Labs  Lab 12/31/21 0235 01/01/22 0212 01/02/22 0233 01/03/22 0146 01/04/22 0144  NA 142 139 142 139 142  K 3.6 3.2* 3.8 3.7 4.0  CL 109 108 110 109 112*  CO2 23 20* 22 20* 20*  GLUCOSE 92 93 93 98 109*  BUN 17 13 15  24* 23  CREATININE 1.21 1.08 1.16 1.46* 1.17  CALCIUM 9.2  8.8* 9.0 8.7* 9.0  MG 1.8  --   --  1.9  --     CBC: Recent Labs  Lab 12/30/21 1316 12/31/21 0235 01/01/22 0212 01/03/22 0146  WBC 6.6 7.1 7.1 9.4  NEUTROABS 4.7 4.9  --   --   HGB 14.5 14.8 15.0 15.0  HCT 43.6 43.9 44.1 45.1  MCV 94.4 95.0 92.8 95.1  PLT 161 151 161 150    LFT Recent Labs  Lab 12/30/21 1316 12/31/21 0235 01/02/22 0233  AST 30 28 37  ALT 48* 44 59*  ALKPHOS 58 65 70  BILITOT 1.0 1.2 1.4*  PROT 7.1 6.2* 6.1*  ALBUMIN 4.2 3.3* 3.2*     Antibiotics: Anti-infectives (From admission, onward)    None        DVT prophylaxis: SCDs/start Lovenox for DVT prophylaxis  Code Status: Full code  Family Communication: Discussed with patient's wife at bedside   CONSULTS neurology   Objective    Physical Examination:   General-appears in no acute distress Heart-S1-S2, regular, no murmur auscultated Lungs-clear to auscultation bilaterally, no wheezing or crackles auscultated Abdomen-soft, nontender, no organomegaly Extremities-no edema in the lower extremities Neuro-alert, oriented x3, no focal deficit noted  Status is: Inpatient: Acute encephalopathy    Pressure Injury 12/30/21 Buttocks Medial Stage 2 -  Partial thickness loss of dermis presenting as a shallow open injury with a red, pink wound bed without slough. (Active)  12/30/21 2100  Location: Buttocks  Location Orientation: Medial  Staging: Stage 2 -  Partial thickness loss of dermis presenting as a shallow open injury with a red, pink wound bed without slough.  Wound Description (Comments):   Present on Admission: Yes      Myrakle Wingler S Jaz Laningham   [facility] If 7PM-7AM, please contact night-coverage at [email or web address], Office  [phone]   01/05/2022, 10:36 AM  LOS: 6 days

## 2022-01-05 NOTE — Progress Notes (Signed)
Discussed with patient's wife on phone.  She said that patient wishes to be DNR.  We will change the order to DNR.

## 2022-01-06 DIAGNOSIS — N401 Enlarged prostate with lower urinary tract symptoms: Secondary | ICD-10-CM | POA: Diagnosis not present

## 2022-01-06 DIAGNOSIS — I10 Essential (primary) hypertension: Secondary | ICD-10-CM | POA: Diagnosis not present

## 2022-01-06 DIAGNOSIS — R351 Nocturia: Secondary | ICD-10-CM | POA: Diagnosis not present

## 2022-01-06 MED ORDER — AMLODIPINE BESYLATE 5 MG PO TABS
5.0000 mg | ORAL_TABLET | Freq: Every day | ORAL | Status: DC
  Administered 2022-01-06 – 2022-01-07 (×2): 5 mg via ORAL
  Filled 2022-01-06 (×2): qty 1

## 2022-01-06 NOTE — Progress Notes (Signed)
SATURATION QUALIFICATIONS: (This note is used to comply with regulatory documentation for home oxygen)  Patient Saturations on Room Air at Rest = 93%  Patient Saturations on Room Air while Ambulating = 80%  Patient Saturations on 3 Liters of oxygen while Ambulating = 96%  Please briefly explain why patient needs home oxygen: N/a

## 2022-01-06 NOTE — TOC Progression Note (Addendum)
Transition of Care [facility]) - Progression Note    Patient Details  Name: Jeffery [name] MRN: [id number] Date of Birth: Dec 28, 1935  [facility] CM/SW Contact  Lorri Frederick, LCSW Phone Number: 01/06/2022, 10:02 AM  Clinical Narrative:   [facility] does offer bed, offers given to wife and she accepts [facility].  Bed available tomorrow.  [facility] informed.  1515: Auth request submitted in [place].  Expected Discharge Plan: Skilled Nursing Facility Barriers to Discharge: Continued Medical Work up, SNF Pending bed offer  Expected Discharge Plan and Services Expected Discharge Plan: Skilled Nursing Facility In-house Referral: Clinical Social Work   Post Acute Care Choice: Skilled Nursing Facility Living arrangements for the past 2 months: Single Family Home                                       Social Determinants of Health (SDOH) Interventions    Readmission Risk Interventions     No data to display

## 2022-01-06 NOTE — Progress Notes (Signed)
Physical Therapy Treatment Patient Details Name: Larri Yehle MRN: [id number] DOB: Dec 07, 1935 Today's Date: 01/06/2022   History of Present Illness 86 y/o male presented to ED on 12/30/21 for worsening confusion with hallucinations x 3 weeks. PMH: dementia, CAD s/p stent placement, HTN, restrictive lung disease, chronic back pain s/p spinal cord stimulator    PT Comments    Pt demonstrating increased tolerance to functional activity this session. Min guard for transfers and able to ambulate limited hallway distances with RW and minA for balance. Pt demonstrating deficits in dynamic balance with head turns during ambulation. Requesting to sit after complaints of lightheadedness that did not improve or resolve after standing rest break. BP measured at 132/103 upon return to room. O2 remained at 97% on RA during ambulation. PT will continue to follow while inpatient to address deficits in balance and tolerance to functional activity.    Recommendations for follow up therapy are one component of a multi-disciplinary discharge planning process, led by the attending physician.  Recommendations may be updated based on patient status, additional functional criteria and insurance authorization.  Follow Up Recommendations  Skilled nursing-short term rehab (<3 hours/day) Can patient physically be transported by private vehicle: Yes   Assistance Recommended at Discharge Frequent or constant Supervision/Assistance  Patient can return home with the following A little help with walking and/or transfers;A little help with bathing/dressing/bathroom;Assistance with cooking/housework;Direct supervision/assist for medications management;Direct supervision/assist for financial management;Assist for transportation;Help with stairs or ramp for entrance   Equipment Recommendations  Rolling walker (2 wheels)    Recommendations for Other Services       Precautions / Restrictions Precautions Precautions:  Fall Precaution Comments: monitor HR/O2 Restrictions Weight Bearing Restrictions: No     Mobility  Bed Mobility               General bed mobility comments: pt in chair upon arrival    Transfers Overall transfer level: Needs assistance Equipment used: Rolling walker (2 wheels) Transfers: Sit to/from Stand Sit to Stand: Min guard           General transfer comment: min guard for safety    Ambulation/Gait Ambulation/Gait assistance: Min assist Gait Distance (Feet): 150 Feet Assistive device: Rolling walker (2 wheels) Gait Pattern/deviations: Step-through pattern, Decreased stride length, Shuffle, Trunk flexed Gait velocity: decreased Gait velocity interpretation: <1.8 ft/sec, indicate of risk for recurrent falls   General Gait Details: minA for balance, decreased gait speed with head turns. Cues for upright posture but pt kyphotic at baseline. Complaints of lightheadedness, utilized chair follow to sit   Social research officer, government Rankin (Stroke Patients Only)       Balance Overall balance assessment: Needs assistance Sitting-balance support: No upper extremity supported, Feet supported Sitting balance-Leahy Scale: Good     Standing balance support: Bilateral upper extremity supported, During functional activity Standing balance-Leahy Scale: Poor Standing balance comment: reliant on RW, decreased balance with head turns during ambulation                            Cognition Arousal/Alertness: Awake/alert Behavior During Therapy: WFL for tasks assessed/performed Overall Cognitive Status: History of cognitive impairments - at baseline                                 General Comments: hx of  dementia, followed commands consistently and with no difficulty.        Exercises      General Comments General comments (skin integrity, edema, etc.): O2 97% on RA during ambulation      Pertinent  Vitals/Pain Pain Assessment Faces Pain Scale: Hurts a little bit Pain Location: neck and R flank Pain Descriptors / Indicators: Sore, Grimacing Pain Intervention(s): Limited activity within patient's tolerance, Monitored during session    Home Living                          Prior Function            PT Goals (current goals can now be found in the care plan section) Acute Rehab PT Goals Patient Stated Goal: to go home PT Goal Formulation: With family Time For Goal Achievement: 01/16/22 Potential to Achieve Goals: Good Progress towards PT goals: Progressing toward goals    Frequency    Min 2X/week      PT Plan Current plan remains appropriate    Co-evaluation              AM-PAC PT "6 Clicks" Mobility   Outcome Measure  Help needed turning from your back to your side while in a flat bed without using bedrails?: A Little Help needed moving from lying on your back to sitting on the side of a flat bed without using bedrails?: A Little Help needed moving to and from a bed to a chair (including a wheelchair)?: A Little Help needed standing up from a chair using your arms (e.g., wheelchair or bedside chair)?: A Little Help needed to walk in hospital room?: A Little Help needed climbing 3-5 steps with a railing? : Total 6 Click Score: 16    End of Session Equipment Utilized During Treatment: Gait belt Activity Tolerance: Patient tolerated treatment well;Treatment limited secondary to medical complications (Comment) (lightheadedness) Patient left: in chair;with call bell/phone within reach;with chair alarm set;with family/visitor present Nurse Communication: Mobility status PT Visit Diagnosis: Unsteadiness on feet (R26.81);History of falling (Z91.81);Muscle weakness (generalized) (M62.81);Other abnormalities of gait and mobility (R26.89)     Time: 1020-1043 PT Time Calculation (min) (ACUTE ONLY): 23 min  Charges:  $Gait Training: 8-22 mins $Therapeutic  Activity: 8-22 mins                    Davina Poke, SPT Acute Rehabilitation Services  Office: [phone]    Davina Poke 01/06/2022, 11:55 AM

## 2022-01-06 NOTE — Plan of Care (Signed)
Anxious at the beginning of the shift due to lack of education regarding plan of care and discharge. Reports needing to speak with social worker regarding equipment needs at home. No other acute events overnight.    Problem: Education: Goal: Knowledge of General Education information will improve Description: Including pain rating scale, medication(s)/side effects and non-pharmacologic comfort measures Outcome: Progressing   Problem: Health Behavior/Discharge Planning: Goal: Ability to manage health-related needs will improve Outcome: Progressing   Problem: Clinical Measurements: Goal: Ability to maintain clinical measurements within normal limits will improve Outcome: Progressing Goal: Will remain free from infection Outcome: Progressing Goal: Diagnostic test results will improve Outcome: Progressing Goal: Respiratory complications will improve Outcome: Progressing Goal: Cardiovascular complication will be avoided Outcome: Progressing   Problem: Activity: Goal: Risk for activity intolerance will decrease Outcome: Progressing   Problem: Nutrition: Goal: Adequate nutrition will be maintained Outcome: Progressing   Problem: Coping: Goal: Level of anxiety will decrease Outcome: Progressing   Problem: Elimination: Goal: Will not experience complications related to bowel motility Outcome: Progressing Goal: Will not experience complications related to urinary retention Outcome: Progressing   Problem: Pain Managment: Goal: General experience of comfort will improve Outcome: Progressing   Problem: Safety: Goal: Ability to remain free from injury will improve Outcome: Progressing   Problem: Skin Integrity: Goal: Risk for impaired skin integrity will decrease Outcome: Progressing

## 2022-01-06 NOTE — Progress Notes (Signed)
Mobility Specialist Progress Note   01/06/22 1740  Mobility  Activity Ambulated with assistance in hallway  Level of Assistance +2 (takes two people) (for line management/safety)  Games developer wheel walker  Distance Ambulated (ft) 96 ft  Activity Response Tolerated well  $Mobility charge 1 Mobility   Pre Mobility: 70 HR, 93% SpO2 During Mobility: 92 HR, 80% SpO2 Post Mobility: 72 HR, 96% SpO2 on 3LO2  Patient received in chair on RA agreeable to participate in mobility.  Pt able to maintain >90% SpO2 while at rest but once ambulating pt desaturated quickly. Pt had no reported symptoms and had no complaints but required 3LO2 + increased time during ambulation to maintain >92%% SpO2. Returned to room without incident. Was left back in chair with all needs met, call bell in reach and pt placed back on RA once settled.   Holland Falling Mobility Specialist MS [facility] #:  [phone] Acute Rehab Office:  [phone]

## 2022-01-06 NOTE — Plan of Care (Signed)
?  Problem: Nutrition: ?Goal: Adequate nutrition will be maintained ?Outcome: Progressing ?  ?Problem: Coping: ?Goal: Level of anxiety will decrease ?Outcome: Progressing ?  ?Problem: Elimination: ?Goal: Will not experience complications related to bowel motility ?Outcome: Progressing ?Goal: Will not experience complications related to urinary retention ?Outcome: Progressing ?  ?Problem: Pain Managment: ?Goal: General experience of comfort will improve ?Outcome: Progressing ?  ?Problem: Safety: ?Goal: Ability to remain free from injury will improve ?Outcome: Progressing ?  ?

## 2022-01-06 NOTE — Progress Notes (Addendum)
[facility]  PROGRESS NOTE  Jeffery [name] BJS:[id number] DOB: 09/22/1935 DOA: 12/30/2021 PCP: Frederica Kuster, MD   Brief HPI:   86 year old male with medical history of dementia, depression, CAD s/p PCI with stent placement in February 2021, essential hypertension, restrictive lung disease, chronic back pain status post spinal cord stimulator, BPH, who is admitted to [facility] on 12/30/2021 by way of transfer from [facility] emergency department with acute encephalopathy superimposed on dementia after presenting from home to the latter for evaluation of altered mental status. Patient has been confused for about 2 weeks.  At baseline he does have history of dementia.  He also was having visual hallucinations.    Subjective   Patient seen and examined, slept better last night.  Used CPAP at bedtime.   Assessment/Plan:   Acute encephalopathy -Significantly improved -Likely from polypharmacy, underlying sleep apnea/obesity hypoventilation syndrome -Patient on multiple medication including Wellbutrin, Lexapro, Flexeril, Mirapex -Neurology consulted -TSH/ammonia normal -UDS was positive for THC-wife said 2 months ago she had given him Gummies from the hemp store -Neurology recommended psychiatry consultation for visual hallucinations -Psychiatry consulted -Patient started on low-dose Seroquel 25 mg nightly -Continue oxygen via nasal cannula to keep O2 sats more than 92%  Acute kidney injury -Creatinine went up to 1.46 -Likely from hypotension -Patient also received IV contrast for CTA chest yesterday?  contrast-induced injury -Started on IV fluids with normal saline -Resolved, today creatinine is down to 1.17  Renal cysts -Noted incidentally on CT chest -Renal ultrasound obtained shows multiple renal cysts -Discussed with nephrology; no intervention recommended -Outpatient follow-up  Acute hypoxemic respiratory failure -Patient's O2 sats have been dropping to  88% on room air -He has been off-and-on requiring 2 L of oxygen via nasal cannula -Chest x-ray was unremarkable -Patient did go to [place] by air, and has been confused since then -CT chest obtained was negative for PE; showed marked volume loss in the lung ,left lower lobe; likely from severe scoliosis.  Discussed with pulmonology, no further intervention recommended.  Continue incentive spirometry -Continue duo nebs nebulizers every 6 hours -Continue Pulmicort nebulizer twice daily, patient has been on Pulmicort at home  Obstructive sleep apnea -Continue CPAP nightly  Depression -Home medications include Wellbutrin, Lexapro on hold  BPH -Continue tamsulosin  Hypertension -Patient blood pressure dropped yesterday -250 cc normal saline bolus given x 1 - hydralazine and amlodipine are currently on hold -Blood pressure is now creeping up -We will restart amlodipine at low-dose of 5 mg p.o. daily   Hypokalemia -Replete   Medications     aspirin  81 mg Oral Daily   budesonide  0.5 mg Nebulization BID   colestipol  2 g Oral BID   enoxaparin (LOVENOX) injection  40 mg Subcutaneous Q24H   famotidine  20 mg Oral Daily   ipratropium-albuterol  3 mL Nebulization Once   latanoprost  1 drop Both Eyes QHS   QUEtiapine  25 mg Oral QHS   tamsulosin  0.4 mg Oral Daily   timolol  1 drop Both Eyes BID     Data Reviewed:   CBG:  Recent Labs  Lab 01/05/22 2235  GLUCAP 103*    SpO2: 97 % O2 Flow Rate (L/min): 2 L/min    Vitals:   01/06/22 0418 01/06/22 0500 01/06/22 0730 01/06/22 0828  BP: (!) 151/111  (!) 144/85   Pulse: 84  81   Resp: 18  17   Temp: 98 F (36.7 C)  98.4 F (36.9  C)   TempSrc: Oral  Oral   SpO2: 95%  95% 97%  Weight:  112.8 kg    Height:          Data Reviewed:  Basic Metabolic Panel: Recent Labs  Lab 12/31/21 0235 01/01/22 0212 01/02/22 0233 01/03/22 0146 01/04/22 0144  NA 142 139 142 139 142  K 3.6 3.2* 3.8 3.7 4.0  CL 109 108 110  109 112*  CO2 23 20* 22 20* 20*  GLUCOSE 92 93 93 98 109*  BUN 17 13 15  24* 23  CREATININE 1.21 1.08 1.16 1.46* 1.17  CALCIUM 9.2 8.8* 9.0 8.7* 9.0  MG 1.8  --   --  1.9  --     CBC: Recent Labs  Lab 12/30/21 1316 12/31/21 0235 01/01/22 0212 01/03/22 0146  WBC 6.6 7.1 7.1 9.4  NEUTROABS 4.7 4.9  --   --   HGB 14.5 14.8 15.0 15.0  HCT 43.6 43.9 44.1 45.1  MCV 94.4 95.0 92.8 95.1  PLT 161 151 161 150    LFT Recent Labs  Lab 12/30/21 1316 12/31/21 0235 01/02/22 0233  AST 30 28 37  ALT 48* 44 59*  ALKPHOS 58 65 70  BILITOT 1.0 1.2 1.4*  PROT 7.1 6.2* 6.1*  ALBUMIN 4.2 3.3* 3.2*     Antibiotics: Anti-infectives (From admission, onward)    None        DVT prophylaxis: SCDs/start Lovenox for DVT prophylaxis  Code Status: Full code  Family Communication: Discussed with patient's wife at bedside   CONSULTS neurology   Objective    Physical Examination:   General-appears in no acute distress Heart-S1-S2, regular, no murmur auscultated Lungs-clear to auscultation bilaterally, no wheezing or crackles auscultated Abdomen-soft, nontender, no organomegaly Extremities-no edema in the lower extremities Neuro-alert, oriented x3, no focal deficit noted  Status is: Inpatient: Acute encephalopathy    Pressure Injury 12/30/21 Buttocks Medial Stage 2 -  Partial thickness loss of dermis presenting as a shallow open injury with a red, pink wound bed without slough. (Active)  12/30/21 2100  Location: Buttocks  Location Orientation: Medial  Staging: Stage 2 -  Partial thickness loss of dermis presenting as a shallow open injury with a red, pink wound bed without slough.  Wound Description (Comments):   Present on Admission: Yes      Luba Matzen S Martrell Eguia   [facility] If 7PM-7AM, please contact night-coverage at [email or web address], Office  [phone]   01/06/2022, 11:46 AM  LOS: 7 days

## 2022-01-06 NOTE — Progress Notes (Addendum)
Occupational Therapy Treatment Patient Details Name: Jeffery [name] MRN: [id number] DOB: 03/13/1936 Today's Date: 01/06/2022   History of present illness 86 y/o male presented to ED on 12/30/21 for worsening confusion with hallucinations x 3 weeks. PMH: dementia, CAD s/p stent placement, HTN, restrictive lung disease, chronic back pain s/p spinal cord stimulator   OT comments  Pt progressing well towards OT goals, able to progress bathroom mobility using RW with min guard and minor cues for DME use. Pt able to assist with toileting hygiene at Min A and complete various ADLs standing at sink with Supervision. Pt does endorse headache and dizziness that is near constant and increases with activity - HR notably increasing from 90-100s to 130s at times though all over vitals WFL on RA. Noted pt wife requesting SNF rehab. Feel pt could still DC with HHOT and light assist from family though could benefit from SNF rehab to maximize independence/safety with daily tasks. Will continue to follow acutely.   Recommendations for follow up therapy are one component of a multi-disciplinary discharge planning process, led by the attending physician.  Recommendations may be updated based on patient status, additional functional criteria and insurance authorization.    Follow Up Recommendations  Skilled nursing-short term rehab (<3 hours/day) (pt/wife requesting SNF rehab though feel pt also appropriate for [facility] w/ family assist)    Assistance Recommended at Discharge Frequent or constant Supervision/Assistance  Patient can return home with the following  A little help with walking and/or transfers;A little help with bathing/dressing/bathroom;Assistance with cooking/housework;Direct supervision/assist for medications management;Direct supervision/assist for financial management;Assist for transportation;Help with stairs or ramp for entrance   Equipment Recommendations  BSC/3in1    Recommendations for Other Services       Precautions / Restrictions Precautions Precautions: Fall;Other (comment) Precaution Comments: monitor HR/O2 Restrictions Weight Bearing Restrictions: No       Mobility Bed Mobility Overal bed mobility: Needs Assistance Bed Mobility: Supine to Sit     Supine to sit: Min assist, HOB elevated     General bed mobility comments: assist to lift trunk, increased time    Transfers Overall transfer level: Needs assistance Equipment used: Rolling walker (2 wheels) Transfers: Sit to/from Stand Sit to Stand: Min guard           General transfer comment: able to stand from bedside and toilet without assist     Balance Overall balance assessment: Needs assistance Sitting-balance support: No upper extremity supported, Feet supported Sitting balance-Leahy Scale: Good     Standing balance support: Bilateral upper extremity supported, During functional activity Standing balance-Leahy Scale: Fair Standing balance comment: standing at sink without UE support during ADLs                           ADL either performed or assessed with clinical judgement   ADL Overall ADL's : Needs assistance/impaired     Grooming: Supervision/safety;Standing;Oral care;Wash/dry hands Grooming Details (indicate cue type and reason): minor cues for sequencing, no overt LOB                 Toilet Transfer: Min guard;Ambulation;Rolling walker (2 wheels);Comfort height toilet;Grab bars Toilet Transfer Details (indicate cue type and reason): minor cues for slow descent onto toilet.use of grab bars helpful and pt reports may need to get these at home. better navigation with RW today with minor cues to stay close to DME Toileting- Clothing Manipulation and Hygiene: Minimal assistance;Sit to/from stand;Sitting/lateral lean Toileting - Clothing Manipulation  Details (indicate cue type and reason): assist for clothing mgmt, able to perform posterior hygiene seated on toilet      Functional mobility during ADLs: Min guard;Rolling walker (2 wheels) General ADL Comments: improving ADL mobility with pt able to mobilize to/from bathroom using RW fairly stable. Does endorse dizziness and headache increasing with activity, noted HR increase though O2/BP [facility]    Extremity/Trunk Assessment Upper Extremity Assessment Upper Extremity Assessment: Generalized weakness (questionable impaired shoulder ROM during functional reaching)   Lower Extremity Assessment Lower Extremity Assessment: Defer to PT evaluation        Vision   Vision Assessment?: No apparent visual deficits   Perception     Praxis      Cognition Arousal/Alertness: Awake/alert Behavior During Therapy: WFL for tasks assessed/performed Overall Cognitive Status: History of cognitive impairments - at baseline                                 General Comments: hx of dementia, STM deficits. follows directions consistently and seems more cognitively clear than on eval.        Exercises      Shoulder Instructions       General Comments      Pertinent Vitals/ Pain       Pain Assessment Pain Assessment: Faces Faces Pain Scale: Hurts little more Pain Location: headache/neck Pain Descriptors / Indicators: Sore, Grimacing Pain Intervention(s): Heat applied, Monitored during session  Home Living                                          Prior Functioning/Environment              Frequency  Min 2X/week        Progress Toward Goals  OT Goals(current goals can now be found in the care plan section)  Progress towards OT goals: Progressing toward goals  Acute Rehab OT Goals Patient Stated Goal: go to rehab, stop dizziness OT Goal Formulation: With patient Time For Goal Achievement: 01/16/22 Potential to Achieve Goals: Good ADL Goals Pt Will Transfer to Toilet: with modified independence;ambulating Pt Will Perform Toileting - Clothing Manipulation and  hygiene: with set-up;sitting/lateral leans;sit to/from stand Additional ADL Goal #1: Pt to increase standing activity tolerance > 10 min during functional tasks to improve endurance and reduce fall risk  Plan Discharge plan needs to be updated    Co-evaluation                 AM-PAC OT "6 Clicks" Daily Activity     Outcome Measure   Help from another person eating meals?: A Little Help from another person taking care of personal grooming?: A Little Help from another person toileting, which includes using toliet, bedpan, or urinal?: A Little Help from another person bathing (including washing, rinsing, drying)?: A Lot Help from another person to put on and taking off regular upper body clothing?: A Little Help from another person to put on and taking off regular lower body clothing?: A Lot 6 Click Score: 16    End of Session Equipment Utilized During Treatment: Rolling walker (2 wheels);Gait belt  OT Visit Diagnosis: Unsteadiness on feet (R26.81);Other symptoms and signs involving cognitive function   Activity Tolerance Patient tolerated treatment well   Patient Left in chair;with call bell/phone within reach;with chair alarm set  Nurse Communication Mobility status (discussed with NT)        Time: 3903-0092 OT Time Calculation (min): 41 min  Charges: OT General Charges $OT Visit: 1 Visit OT Treatments $Self Care/Home Management : 23-37 mins $Therapeutic Activity: 8-22 mins  Bradd Canary, OTR/L Acute Rehab Services Office: [phone]   Lorre Munroe 01/06/2022, 9:44 AM

## 2022-01-07 DIAGNOSIS — M6281 Muscle weakness (generalized): Secondary | ICD-10-CM | POA: Diagnosis not present

## 2022-01-07 DIAGNOSIS — N182 Chronic kidney disease, stage 2 (mild): Secondary | ICD-10-CM | POA: Diagnosis not present

## 2022-01-07 DIAGNOSIS — N400 Enlarged prostate without lower urinary tract symptoms: Secondary | ICD-10-CM | POA: Diagnosis not present

## 2022-01-07 DIAGNOSIS — N401 Enlarged prostate with lower urinary tract symptoms: Secondary | ICD-10-CM | POA: Diagnosis not present

## 2022-01-07 DIAGNOSIS — E669 Obesity, unspecified: Secondary | ICD-10-CM | POA: Diagnosis not present

## 2022-01-07 DIAGNOSIS — R351 Nocturia: Secondary | ICD-10-CM | POA: Diagnosis not present

## 2022-01-07 DIAGNOSIS — Z743 Need for continuous supervision: Secondary | ICD-10-CM | POA: Diagnosis not present

## 2022-01-07 DIAGNOSIS — I10 Essential (primary) hypertension: Secondary | ICD-10-CM | POA: Diagnosis not present

## 2022-01-07 DIAGNOSIS — R410 Disorientation, unspecified: Secondary | ICD-10-CM | POA: Diagnosis not present

## 2022-01-07 DIAGNOSIS — R2689 Other abnormalities of gait and mobility: Secondary | ICD-10-CM | POA: Diagnosis not present

## 2022-01-07 DIAGNOSIS — G9340 Encephalopathy, unspecified: Secondary | ICD-10-CM | POA: Diagnosis not present

## 2022-01-07 DIAGNOSIS — L22 Diaper dermatitis: Secondary | ICD-10-CM | POA: Diagnosis not present

## 2022-01-07 DIAGNOSIS — L89301 Pressure ulcer of unspecified buttock, stage 1: Secondary | ICD-10-CM | POA: Diagnosis not present

## 2022-01-07 DIAGNOSIS — F331 Major depressive disorder, recurrent, moderate: Secondary | ICD-10-CM | POA: Diagnosis not present

## 2022-01-07 DIAGNOSIS — G9341 Metabolic encephalopathy: Secondary | ICD-10-CM | POA: Diagnosis not present

## 2022-01-07 DIAGNOSIS — B379 Candidiasis, unspecified: Secondary | ICD-10-CM | POA: Diagnosis not present

## 2022-01-07 DIAGNOSIS — K219 Gastro-esophageal reflux disease without esophagitis: Secondary | ICD-10-CM | POA: Diagnosis not present

## 2022-01-07 DIAGNOSIS — F32A Depression, unspecified: Secondary | ICD-10-CM | POA: Diagnosis not present

## 2022-01-07 DIAGNOSIS — R519 Headache, unspecified: Secondary | ICD-10-CM | POA: Diagnosis not present

## 2022-01-07 DIAGNOSIS — L8990 Pressure ulcer of unspecified site, unspecified stage: Secondary | ICD-10-CM | POA: Diagnosis not present

## 2022-01-07 DIAGNOSIS — Z7689 Persons encountering health services in other specified circumstances: Secondary | ICD-10-CM | POA: Diagnosis not present

## 2022-01-07 DIAGNOSIS — R531 Weakness: Secondary | ICD-10-CM | POA: Diagnosis not present

## 2022-01-07 DIAGNOSIS — M25511 Pain in right shoulder: Secondary | ICD-10-CM | POA: Diagnosis not present

## 2022-01-07 MED ORDER — FAMOTIDINE 20 MG PO TABS: 20.0000 mg | ORAL_TABLET | Freq: Every day | ORAL | Status: DC | PRN

## 2022-01-07 MED ORDER — IPRATROPIUM-ALBUTEROL 0.5-2.5 (3) MG/3ML IN SOLN: 3.0000 mL | Freq: Four times a day (QID) | RESPIRATORY_TRACT | Status: DC | PRN

## 2022-01-07 MED ORDER — QUETIAPINE FUMARATE 25 MG PO TABS: 25.0000 mg | ORAL_TABLET | Freq: Every day | ORAL | Status: DC

## 2022-01-07 MED ORDER — METOPROLOL SUCCINATE ER 25 MG PO TB24: 12.5000 mg | ORAL_TABLET | Freq: Every day | ORAL | Status: DC

## 2022-01-07 MED ORDER — AMLODIPINE BESYLATE 5 MG PO TABS: 5.0000 mg | ORAL_TABLET | Freq: Every day | ORAL | Status: DC

## 2022-01-07 MED ORDER — ORAL CARE MOUTH RINSE: 15.0000 mL | OROMUCOSAL | Status: DC | PRN

## 2022-01-07 NOTE — TOC Transition Note (Signed)
Transition of Care [facility] - CM/SW Discharge Note   Patient Details  Name: Lupe Bonner MRN: [id number] Date of Birth: 04/23/1936  Transition of [facility]) CM/SW Contact:  Lorri Frederick, LCSW Phone Number: 01/07/2022, 12:35 PM   Clinical Narrative:   Pt discharging to [facility].  RN call [phone] for report.     Final next level of care: Skilled Nursing Facility Barriers to Discharge: Barriers Resolved   Patient Goals and CMS Choice   CMS Medicare.gov Compare Post Acute Care list provided to:: Patient Represenative (must comment) (wife Eduardo Osier) Choice offered to / list presented to : Spouse  Discharge Placement              Patient chooses bed at:  [facility]) Patient to be transferred to facility by: [facility] Name of family member notified: wife Eduardo Osier in room Patient and family notified of of transfer: 01/07/22  Discharge Plan and Services In-house Referral: Clinical Social Work   Post Acute Care Choice: Skilled Nursing Facility                               Social Determinants of Health (SDOH) Interventions     Readmission Risk Interventions     No data to display

## 2022-01-07 NOTE — Plan of Care (Signed)

## 2022-01-07 NOTE — Progress Notes (Signed)
Attempted to call report to [facility] several times. No answer, left voicemail with callback information for report.

## 2022-01-07 NOTE — TOC Progression Note (Signed)
Transition of Care [facility]) - Progression Note    Patient Details  Name: Jeffery [name] MRN: [id number] Date of Birth: 1935-11-04  Transition of Care [facility]) CM/SW Contact  Lorri Frederick, LCSW Phone Number: 01/07/2022, 8:53 AM  Clinical Narrative:   Auth approved in [place]: [id number], [id number], 3 days: 7/11-7/13.    Expected Discharge Plan: Skilled Nursing Facility Barriers to Discharge: Continued Medical Work up, SNF Pending bed offer  Expected Discharge Plan and Services Expected Discharge Plan: Skilled Nursing Facility In-house Referral: Clinical Social Work   Post Acute Care Choice: Skilled Nursing Facility Living arrangements for the past 2 months: Single Family Home                                       Social Determinants of Health (SDOH) Interventions    Readmission Risk Interventions     No data to display

## 2022-01-07 NOTE — Discharge Summary (Addendum)
Physician Discharge Summary   Patient: Jeffery [name] MRN: [id number] DOB: [birth year]  Admit date:     12/30/2021  Discharge date: 01/07/22  Discharge Physician: Meredeth IdeGagan S Torry Istre   PCP: Frederica KusterMiller, Stephen M, MD   Recommendations at discharge:   Patient discharged to skilled nursing facility for rehab Follow-up your cardiologist to confirm the home medications Patient to be discharged on oxygen 3 L/min  Discharge Diagnoses: Principal Problem:   Acute encephalopathy Active Problems:   Primary hypertension   Restrictive lung disease   BPH (benign prostatic hyperplasia)   Depression   Pressure injury of skin  Resolved Problems:   * No resolved hospital problems. *  Hospital Course: 86 year old male with medical history of dementia, depression, CAD s/p PCI with stent placement in February 2021, essential hypertension, restrictive lung disease, chronic back pain status post spinal cord stimulator, BPH, who is [facility] 12[facility] by way of [facility] with acute encephalopathy superimposed on dementia after presenting from home to the latter for evaluation of altered mental status. Patient has been confused for about 2 weeks.  At baseline he does have history of dementia.  He also was having visual hallucinations.  Assessment and Plan:  Acute encephalopathy -Significantly improved -Likely from polypharmacy, underlying sleep apnea/obesity hypoventilation syndrome -Patient on multiple medication including Wellbutrin, Lexapro, Flexeril, Mirapex.  These medications have been discontinued -Neurology was consulted -TSH/ammonia normal -UDS was positive for THC-wife said 2 months ago she had given him Gummies from the hemp store -Neurology recommended psychiatry consultation for visual hallucinations -Psychiatry consulted -Patient started on low-dose Seroquel 25 mg nightly -Continue oxygen via nasal cannula to keep O2 sats more than 92%    Acute kidney injury -Creatinine went up to 1.46 -Likely from hypotension -Started on IV fluids with normal saline -Resolved,  creatinine is down to 1.17   Renal cysts -Noted incidentally on CT chest -Renal ultrasound obtained shows multiple renal cysts -Discussed with nephrology; no intervention recommended -Outpatient follow-up   Acute hypoxemic respiratory failure -Patient's O2 sats have been dropping to 88% on room air -He has been off-and-on requiring 2 L of oxygen via nasal cannula -Chest x-ray was unremarkable -[place] by air, and has been confused since then -CT chest obtained was negative for PE; showed marked volume loss in the lung ,left lower lobe; likely from severe scoliosis.  Discussed with pulmonology, no further intervention recommended.  Continue incentive spirometry -Continue duo nebs nebulizers every 6 hours -Continue Pulmicort nebulizer twice daily, patient has been on Pulmicort at home -Patient will be discharged on oxygen 3 L/min   Obstructive sleep apnea -Continue CPAP nightly   Depression -Home medications include Wellbutrin, Lexapro has been discontinued   BPH -Continue tamsulosin   Hypertension -Because of low blood pressure dose of amlodipine changed to 5 mg daily -Continue Toprol xl 12.5 mg po daily -will hold lasix and potassium  CAD s/p stent placement -[facility] -Metoprolol does not show up on the home medication list for patient but it shows on the list from documents noted on care everywhere -It also shows that he is on Aldactone however patient is not taking it -Patient to follow-up with cardiology to confirm the medications he is taking at home   Hypokalemia -Replete          Consultants:  Procedures performed:  Disposition: Skilled nursing facility Diet recommendation:  Discharge Diet Orders (From admission, onward)  Start     Ordered   01/07/22 0000  Diet - low  sodium heart healthy        01/07/22 1219           Cardiac diet DISCHARGE MEDICATION: Allergies as of 01/07/2022   No Known Allergies      Medication List     STOP taking these medications    buPROPion 300 MG 24 hr tablet Commonly known as: WELLBUTRIN XL   butalbital-acetaminophen-caffeine 50-325-40 MG tablet Commonly known as: FIORICET   CALCIUM PO   cyclobenzaprine 10 MG tablet Commonly known as: FLEXERIL   escitalopram 20 MG tablet Commonly known as: LEXAPRO   furosemide 20 MG tablet Commonly known as: LASIX   omeprazole 20 MG capsule Commonly known as: PRILOSEC   potassium chloride SA 20 MEQ tablet Commonly known as: KLOR-CON M   pramipexole 0.25 MG tablet Commonly known as: MIRAPEX   prasugrel 10 MG Tabs tablet Commonly known as: EFFIENT   spironolactone 25 MG tablet Commonly known as: ALDACTONE   terazosin 5 MG capsule Commonly known as: HYTRIN       TAKE these medications    acetaminophen 500 MG tablet Commonly known as: TYLENOL Take 1,000 mg by mouth 2 (two) times daily.   amLODipine 5 MG tablet Commonly known as: NORVASC Take 1 tablet (5 mg total) by mouth daily. Start taking on: January 08, 2022 What changed:  medication strength how much to take   aspirin 81 MG tablet Take 81 mg by mouth at bedtime.   colestipol 1 g tablet Commonly known as: COLESTID Take 1 tablet (1 g total) by mouth 2 (two) times daily. What changed:  how much to take when to take this additional instructions   famotidine 20 MG tablet Commonly known as: PEPCID Take 1 tablet (20 mg total) by mouth daily as needed for heartburn or indigestion.   Fluticasone Furoate 200 MCG/ACT Aepb Inhale 1 puff into the lungs daily at 12 noon. What changed: when to take this   ipratropium-albuterol 0.5-2.5 (3) MG/3ML Soln Commonly known as: DUONEB Take 3 mLs by nebulization every 6 (six) hours as needed.   latanoprost 0.005 % ophthalmic solution Commonly known  as: XALATAN Place 1 drop into both eyes at bedtime.   LIDOCAINE EX Place 1 patch onto the skin daily as needed (back pain).   magnesium oxide 400 MG tablet Commonly known as: MAG-OX Take 400 mg by mouth every morning.   metoprolol succinate 25 MG 24 hr tablet Commonly known as: Toprol XL Take 0.5 tablets (12.5 mg total) by mouth daily.   nitroGLYCERIN 0.2 mg/hr patch Commonly known as: Minitran Place 1 patch (0.2 mg total) onto the skin daily. Use I/4 patch q 24 hrs   PROBIOTIC PO Take 1 capsule by mouth at bedtime.   QUEtiapine 25 MG tablet Commonly known as: SEROQUEL Take 1 tablet (25 mg total) by mouth at bedtime.   tamsulosin 0.4 MG Caps capsule Commonly known as: FLOMAX TAKE 1 CAPSULE BY MOUTH ONCE DAILY WITH SUPPER What changed: See the new instructions.   timolol 0.5 % ophthalmic solution Commonly known as: TIMOPTIC Place 1 drop into both eyes 2 (two) times daily.   vitamin B-12 1000 MCG tablet Commonly known as: CYANOCOBALAMIN Take 1,000 mcg by mouth every morning.   Vitamin D 50 MCG (2000 UT) Caps Take 2,000 Units by mouth every evening.        Contact information for follow-up providers     Jacalyn Lefevre  M, MD Follow up.   Specialties: Family Medicine, Emergency Medicine Contact information: [address] [place] [address] [phone]              Contact information for after-discharge care     Destination     [facility] .   Service: Skilled Nursing Contact information: [address] [place] [address] [phone]                    Discharge Exam: Filed Weights   01/05/22 0500 01/06/22 0500 01/07/22 0500  Weight: 113.6 kg 112.8 kg 113.5 kg   General-appears in no acute distress Heart-S1-S2, regular, no murmur auscultated Lungs-clear to auscultation bilaterally, no wheezing or crackles auscultated Abdomen-soft, nontender, no organomegaly Extremities-no edema in the lower  extremities Neuro-alert, oriented x3, no focal deficit noted  Condition at discharge: stable  The results of significant diagnostics from this hospitalization (including imaging, microbiology, ancillary and laboratory) are listed below for reference.   Imaging Studies: US RENAL  Result Date: 01/03/2022 CLINICAL DATA:  [id number] EXAM: RENAL / URINARY TRACT ULTRASOUND COMPLETE COMPARISON:  None Available. FINDINGS: Right Kidney: Renal measurements: 12.4 x 11.8 x 7.9 cm = volume: 597.9 mL. There is thinning of the renal cortex seen. There are couple of cysts seen with a cyst at the upper pole measuring 7.4 x 6.7 x 7.2 cm in comparison to 5.3 cm on the previous study. There is a cyst in the lower pole of the right kidney measuring 7.9 x 7 x 8 cm in comparison to 7.4 cm in the previous study. No hydronephrosis or renal mass. Left Kidney: Renal measurements: 12.2 x 7 x 7.3 cm = volume: 326.3 mL. There is thinning of the renal cortex and has further progressed in the interim. There are couple of cysts seen with the largest cyst at the left interpolar mid kidney measuring 11.1 x 11.5 x 7.6 cm in comparison to 12 cm on the previous study. There is a cyst in the lower pole of the left kidney measuring 5.6 x 6 x 7.7 cm in comparison to 7.5 cm on the previous study. Bladder: Appears normal for degree of bladder distention. Other: None. IMPRESSION: There is thinning of the renal cortices seen on either side and has progressed in the interim. Multiple renal cysts in both kidneys. The larger cyst at the lower pole of the right kidney without significant interval change. The cyst in the upper pole of the right kidney measures 7.4 cm in greatest dimension in comparison to 5.3 cm on the previous study. In the left kidney, there is a large cyst at the interpolar mid region measuring 11.5 cm in greatest dimension in comparison to 12 cm on the previous study. The cyst at the lower pole of the left kidney measures 7.7 cm in  greatest dimension and is stable. Electronically Signed   By: Marjo Bicker M.D.   On: 01/03/2022 10:46   CT Angio Chest Pulmonary Embolism (PE) W or WO Contrast  Result Date: 01/03/2022 CLINICAL DATA:  Suspected pulmonary embolism. EXAM: CT ANGIOGRAPHY CHEST WITH CONTRAST TECHNIQUE: Multidetector CT imaging of the chest was performed using the standard protocol during bolus administration of intravenous contrast. Multiplanar CT image reconstructions and MIPs were obtained to evaluate the vascular anatomy. RADIATION DOSE REDUCTION: This exam was performed according to the departmental dose-optimization program which includes automated exposure control, adjustment of the mA and/or kV according to patient size and/or use of iterative reconstruction technique.  CONTRAST:  12mL OMNIPAQUE IOHEXOL 350 MG/ML SOLN COMPARISON:  July 27, 2010 FINDINGS: Cardiovascular: There is mild to moderate severity calcification of the aortic arch. The ascending thoracic aorta measures approximately 4.6 cm in diameter. Satisfactory opacification of the pulmonary arteries to the segmental level. No evidence of pulmonary embolism. Moderate severity cardiomegaly with marked severity coronary artery calcification. A trace amount of anterior pericardial fluid is seen. Mediastinum/Nodes: No enlarged mediastinal, hilar, or axillary lymph nodes. Thyroid gland, trachea, and esophagus demonstrate no significant findings. Lungs/Pleura: Mild lingular and mild bilateral lower lobe atelectasis is seen. Moderate severity left-sided volume loss is seen involving predominantly the left lower lobe. Subsequent right to left shift of the mediastinal structures is noted. There is no evidence of a pleural effusion or pneumothorax. Upper Abdomen: Noninflamed diverticula are seen throughout the visualized portion of the large bowel. Multiple large right renal cysts are noted. Musculoskeletal: A right shoulder replacement is seen with associated streak  artifact and subsequently limited evaluation of the adjacent osseous and soft tissue structures. Multilevel degenerative changes seen throughout the thoracic spine. Review of the MIP images confirms the above findings. IMPRESSION: 1. No evidence of pulmonary embolism. 2. Moderate severity left-sided volume loss involving predominantly the left lower lobe with associated right to left shift of the mediastinal structures. 3. Mild lingular and mild bilateral lower lobe atelectasis. 4. Moderate severity cardiomegaly with marked severity coronary artery calcification. 5. Multiple large right renal cysts. Further evaluation with outpatient renal ultrasound is recommended. 6. Colonic diverticulosis. Aortic Atherosclerosis (ICD10-I70.0). Electronically Signed   By: Aram Candela M.D.   On: 01/03/2022 01:59   MR BRAIN W WO CONTRAST  Result Date: 01/01/2022 CLINICAL DATA:  Altered mental status.  Confusion EXAM: MRI HEAD WITHOUT AND WITH CONTRAST TECHNIQUE: Multiplanar, multiecho pulse sequences of the brain and surrounding structures were obtained without and with intravenous contrast. CONTRAST:  75mL GADAVIST GADOBUTROL 1 MMOL/ML IV SOLN COMPARISON:  CT head 12/30/2021 FINDINGS: Brain: Moderate atrophy. Negative for hydrocephalus. Moderate chronic microvascular ischemic change in the white matter and pons. Negative for acute infarct.  Negative for hemorrhage or mass. No pathologic enhancement following contrast infusion. Image quality degraded by motion. Vascular: Normal arterial flow voids. Skull and upper cervical spine: Negative Sinuses/Orbits: Paranasal sinuses clear. Bilateral cataract extraction Other: None IMPRESSION: Negative for acute infarct Atrophy and chronic microvascular ischemic change Electronically Signed   By: Marlan Palau M.D.   On: 01/01/2022 20:04   EEG adult  Result Date: 01/01/2022 Charlsie Quest, MD     01/02/2022  9:35 AM Patient Name: Jeffery [name] MRN: [id number] Epilepsy Attending:  Charlsie Quest Referring Physician/Provider: Charlsie Quest, MD Date: 01/01/2022 Duration: 30.36 mins Patient history: 86yo M with dementia brought in with ams. EEG to evaluate for seizure. Level of alertness: Awake,asleep AEDs during EEG study: None Technical aspects: This EEG study was done with scalp electrodes positioned according to the 10-20 International system of electrode placement. Electrical activity was acquired at a sampling rate of 500Hz  and reviewed with a high frequency filter of 70Hz  and a low frequency filter of 1Hz . EEG data were recorded continuously and digitally stored. Description: The posterior dominant rhythm consists of 7 Hz activity of moderate voltage (25-35 uV) seen predominantly in posterior head regions, symmetric and reactive to eye opening and eye closing. Sleep was characterized by vertex waves, maximal frontocentral region.  EEG showed continuous generalized 4.5 to 7 Hz theta slowing. Hyperventilation and photic stimulation were not performed.   Patient was  noted to have brief sudden whole-body jerks during sleep.  Concomitant EEG before, during and after the event did not show any EEG change to suggest seizure. ABNORMALITY - Continuous slow, generalized - Background slow IMPRESSION: This study is suggestive of mild diffuse encephalopathy, nonspecific etiology. No seizures or epileptiform discharges were seen throughout the recording. Patient was noted to have brief sudden whole body jerks during sleep without concomitant EEG change. These episodes were not epileptic.  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  Clinical correlation is recommended. Charlsie Quest   DG CHEST PORT 1 VIEW  Result Date: 12/31/2021 CLINICAL DATA:  Encephalopathy EXAM: PORTABLE CHEST 1 VIEW COMPARISON:  01/01/2021 FINDINGS: Stable cardiomegaly. Low lung volumes. Obscuration of the left costophrenic angle could reflect a small pleural effusion. Lungs appear otherwise clear. No  pneumothorax. Lower thoracic spinal stimulator leads. IMPRESSION: Low lung volumes with obscuration of the left costophrenic angle could reflect a small pleural effusion. Consider further evaluation with repeat inspiratory PA and lateral radiographs of the chest. Electronically Signed   By: Duanne Guess D.O.   On: 12/31/2021 10:49   CT HEAD WO CONTRAST  Result Date: 12/30/2021 CLINICAL DATA:  Altered mental status with slurred speech and hallucinations. EXAM: CT HEAD WITHOUT CONTRAST TECHNIQUE: Contiguous axial images were obtained from the base of the skull through the vertex without intravenous contrast. RADIATION DOSE REDUCTION: This exam was performed according to the departmental dose-optimization program which includes automated exposure control, adjustment of the mA and/or kV according to patient size and/or use of iterative reconstruction technique. COMPARISON:  MRI brain dated March 23, 2020. CT head dated September 17, 2019. FINDINGS: Brain: No evidence of acute infarction, hemorrhage, hydrocephalus, extra-axial collection or mass lesion/mass effect. Progressive moderate atrophy and chronic microvascular ischemic changes. Vascular: Atherosclerotic vascular calcification of the carotid siphons. No hyperdense vessel. Unchanged dolichoectasia of the basilar artery. Skull: Normal. Negative for fracture or focal lesion. Sinuses/Orbits: No acute finding. Other: None. IMPRESSION: 1. No acute intracranial abnormality. 2. Progressive moderate atrophy and chronic microvascular ischemic changes. Electronically Signed   By: Obie Dredge M.D.   On: 12/30/2021 14:18    Microbiology: Results for orders placed or performed in visit on 12/26/21  Culture, Urine     Status: None   Collection Time: 12/26/21 11:38 AM   Specimen: Urine  Result Value Ref Range Status   MICRO NUMBER: [id number]  Final   SPECIMEN QUALITY: Adequate  Final   Sample Source URINE  Final   STATUS: FINAL  Final   ISOLATE 1:   Final     Less than 10,000 CFU/mL of single Gram positive organism isolated. No further testing will be performed. If clinically indicated, recollection using a method to minimize contamination, with prompt transfer to Urine Culture Transport Tube, is recommended.  MICROSCOPIC MESSAGE     Status: None   Collection Time: 12/26/21 11:38 AM  Result Value Ref Range Status   Note   Final    Comment: This urine was analyzed for the presence of WBC,  RBC, bacteria, casts, and other formed elements.  Only those elements seen were reported. . .     Labs: CBC: Recent Labs  Lab 01/01/22 0212 01/03/22 0146  WBC 7.1 9.4  HGB 15.0 15.0  HCT 44.1 45.1  MCV 92.8 95.1  PLT 161 150   Basic Metabolic Panel: Recent Labs  Lab 01/01/22 0212 01/02/22 0233 01/03/22 0146 01/04/22 0144  NA 139 142 139 142  K 3.2* 3.8  3.7 4.0  CL 108 110 109 112*  CO2 20* 22 20* 20*  GLUCOSE 93 93 98 109*  BUN 13 15 24* 23  CREATININE 1.08 1.16 1.46* 1.17  CALCIUM 8.8* 9.0 8.7* 9.0  MG  --   --  1.9  --    Liver Function Tests: Recent Labs  Lab 01/02/22 0233  AST 37  ALT 59*  ALKPHOS 70  BILITOT 1.4*  PROT 6.1*  ALBUMIN 3.2*   CBG: Recent Labs  Lab 01/05/22 2235  GLUCAP 103*    Discharge time spent: greater than 30 minutes.  Signed: Meredeth Ide, MD [facility] 01/07/2022

## 2022-01-08 ENCOUNTER — Telehealth: Payer: Self-pay

## 2022-01-08 DIAGNOSIS — L89301 Pressure ulcer of unspecified buttock, stage 1: Secondary | ICD-10-CM | POA: Diagnosis not present

## 2022-01-08 DIAGNOSIS — B379 Candidiasis, unspecified: Secondary | ICD-10-CM | POA: Diagnosis not present

## 2022-01-08 DIAGNOSIS — R519 Headache, unspecified: Secondary | ICD-10-CM | POA: Diagnosis not present

## 2022-01-08 NOTE — Telephone Encounter (Signed)
Transition Care Management Unsuccessful Follow-up Telephone Call  Date of discharge and from where:  01/07/2022  Attempts:  1st Attempt  Reason for unsuccessful TCM follow-up call:  Unable to reach patient,Unable to be reached ;due to release into a skilled nursing facility for rehabilitation

## 2022-01-10 DIAGNOSIS — N182 Chronic kidney disease, stage 2 (mild): Secondary | ICD-10-CM | POA: Diagnosis not present

## 2022-01-10 DIAGNOSIS — Z7689 Persons encountering health services in other specified circumstances: Secondary | ICD-10-CM | POA: Diagnosis not present

## 2022-01-14 DIAGNOSIS — R519 Headache, unspecified: Secondary | ICD-10-CM | POA: Diagnosis not present

## 2022-01-14 DIAGNOSIS — K219 Gastro-esophageal reflux disease without esophagitis: Secondary | ICD-10-CM | POA: Diagnosis not present

## 2022-01-15 ENCOUNTER — Non-Acute Institutional Stay: Payer: Medicare HMO | Admitting: Internal Medicine

## 2022-01-15 DIAGNOSIS — J984 Other disorders of lung: Secondary | ICD-10-CM

## 2022-01-15 NOTE — Progress Notes (Deleted)
Therapist, nutritional Palliative Care Consult Note Telephone: [phone]  Fax: [phone]   Date of encounter: 01/15/22 12:50 PM PATIENT NAME: Jeffery [name] [address] [place] [address]   [id number] (home)  DOB: 1936-06-10 MRN: [id number] PRIMARY CARE PROVIDER:    Frederica Kuster, MD,  [address] [place] [address] [phone]  REFERRING PROVIDER:   Frederica Kuster, MD [address] [place],  [place] [address] [phone]  RESPONSIBLE PARTY:    Contact Information     Name Relation Home Work Mobile   [place] Spouse [phone]  [phone]   Carlos Levering Daughter [phone]  [phone]   Augustine Radar Daughter   [phone]        I met face to face with patient and family in *** home/facility. Palliative Care was asked to follow this patient by consultation request of  Frederica Kuster, MD to address advance care planning and complex medical decision making. This is the initial visit.                                     ASSESSMENT AND PLAN / RECOMMENDATIONS:   Advance Care Planning/Goals of Care: Goals include to maximize quality of life and symptom management. Patient/health care surrogate gave his/her permission to discuss.Our advance care planning conversation included a discussion about:    The value and importance of advance care planning  Experiences with loved ones who have been seriously ill or have died  Exploration of personal, cultural or spiritual beliefs that might influence medical decisions  Exploration of goals of care in the event of a sudden injury or illness  Identification  of a healthcare agent  Review and updating or creation of an  advance directive document . Decision not to resuscitate or to de-escalate disease focused treatments due to poor prognosis. CODE STATUS:  Symptom Management/Plan:    Follow up Palliative Care Visit: Palliative care will continue to follow for  complex medical decision making, advance care planning, and clarification of goals. Return *** weeks or prn.  I spent *** minutes providing this consultation. More than 50% of the time in this consultation was spent in counseling and care coordination.  This visit was coded based on medical decision making (MDM).***  PPS: ***0%  HOSPICE ELIGIBILITY/DIAGNOSIS: TBD  Chief Complaint: ***  HISTORY OF PRESENT ILLNESS:  Jeffery [name] is a 86 y.o. year old male  with *** .   History obtained from review of EMR, discussion with primary team, and interview with family, facility staff/caregiver and/or Mr. Isbell.  I reviewed available labs, medications, imaging, studies and related documents from the EMR.  Records reviewed and summarized above.   ROS  *** General: NAD EYES: denies vision changes ENMT: denies dysphagia Cardiovascular: denies chest pain, denies DOE Pulmonary: denies cough, denies increased SOB Abdomen: endorses good appetite, denies constipation, endorses continence of bowel GU: denies dysuria, endorses continence of urine MSK:  denies increased weakness,  no falls reported Skin: denies rashes or wounds Neurological: denies pain, denies insomnia Psych: Endorses positive mood Heme/lymph/immuno: denies bruises, abnormal bleeding  Physical Exam: Current and past weights: Constitutional: NAD General: frail appearing, thin/WNWD/obese  EYES: anicteric sclera, lids intact, no discharge  ENMT: intact hearing, oral mucous membranes moist, dentition intact CV: S1S2, RRR, no LE edema Pulmonary: LCTA, no increased work of breathing, no cough, room air Abdomen: intake 100%, normo-active BS + 4 quadrants,  soft and non tender, no ascites GU: deferred MSK: no sarcopenia, moves all extremities, ambulatory Skin: warm and dry, no rashes or wounds on visible skin Neuro:  no generalized weakness,  no cognitive impairment Psych: non-anxious affect, A and O x 3 Hem/lymph/immuno: no  widespread bruising CURRENT PROBLEM LIST:  Patient Active Problem List   Diagnosis Date Noted   Pressure injury of skin 07/62/2633   Acute metabolic encephalopathy 35/45/6256   Acute encephalopathy 12/30/2021   Depression 12/30/2021   BPH (benign prostatic hyperplasia)    Obesity (BMI 30-39.9) 07/29/2020   Chronic right shoulder pain 07/29/2020   Moderate episode of recurrent major depressive disorder ([place]) 07/29/2020   OSA on CPAP 07/29/2020   Dizziness 07/29/2020   Former cigar smoker 07/29/2020   Erectile dysfunction 07/29/2020   Generalized anxiety disorder 07/29/2020   Primary hyperparathyroidism ([place]) 07/29/2020   Restrictive lung disease    Primary hypertension 07/24/2020   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[place]) 04/04/2020   S/P reverse total shoulder arthroplasty, 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   PAST MEDICAL HISTORY:  Active Ambulatory Problems    Diagnosis Date Noted   S/P reverse total shoulder arthroplasty, 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[place]) 04/04/2020   Primary hypertension 07/24/2020   Obesity (BMI 30-39.9) 07/29/2020   Chronic right shoulder pain 07/29/2020   Moderate episode of recurrent major depressive disorder ([place]) 07/29/2020   OSA on CPAP 07/29/2020   Dizziness 07/29/2020   Restrictive lung disease    Former cigar smoker 07/29/2020   Erectile dysfunction 07/29/2020   Generalized anxiety disorder 07/29/2020   Primary hyperparathyroidism ([place]) 07/29/2020   BPH (benign prostatic hyperplasia)    Acute metabolic encephalopathy 38/93/7342   Acute encephalopathy 12/30/2021   Depression 12/30/2021   Pressure injury of skin 12/31/2021   Resolved Ambulatory Problems    Diagnosis Date Noted   No Resolved Ambulatory Problems   Past Medical History:  Diagnosis Date   Anxiety and depression    Coronary artery disease    Fecal incontinence    GERD (gastroesophageal reflux disease)    Glaucoma    Hard of hearing     Headache    History of pneumonia    Hyperlipidemia    Hypertension    Memory impairment    Migraine headache    Neck fracture (HCC)    Nocturia    Osteoarthritis    Sleep apnea    Thyroid disease    Urinary frequency    Urinary incontinence    Wears glasses    SOCIAL HX:  Social History   Tobacco Use   Smoking status: Former    Passive exposure: Past   Smokeless tobacco: Never   Tobacco comments:    quit in approximately  2000  Substance Use Topics   Alcohol use: Yes    Comment: occasional   FAMILY HX: No family history on file.    ALLERGIES: No Known Allergies   PERTINENT MEDICATIONS:  Outpatient Encounter Medications as of 01/15/2022  Medication Sig   acetaminophen (TYLENOL) 500 MG tablet Take 1,000 mg by mouth 2 (two) times daily.   amLODipine (NORVASC) 5 MG tablet Take 1 tablet (5 mg total) by mouth daily.   aspirin 81 MG tablet Take 81 mg by mouth at bedtime.   Cholecalciferol (VITAMIN D) 2000 units CAPS Take 2,000 Units by mouth every evening.   colestipol (COLESTID) 1 g tablet Take 1 tablet (1 g total) by mouth 2 (two) times daily. (Patient taking differently:  Take 2 g by mouth See admin instructions. Take 2 tablets (2 g) by mouth twice daily - do not take within one hour of other medications)   famotidine (PEPCID) 20 MG tablet Take 1 tablet (20 mg total) by mouth daily as needed for heartburn or indigestion.   Fluticasone Furoate 200 MCG/ACT AEPB Inhale 1 puff into the lungs daily at 12 noon. (Patient taking differently: Inhale 1 puff into the lungs at bedtime.)   ipratropium-albuterol (DUONEB) 0.5-2.5 (3) MG/3ML SOLN Take 3 mLs by nebulization every 6 (six) hours as needed.   latanoprost (XALATAN) 0.005 % ophthalmic solution Place 1 drop into both eyes at bedtime.   LIDOCAINE EX Place 1 patch onto the skin daily as needed (back pain).   magnesium oxide (MAG-OX) 400 MG tablet Take 400 mg by mouth every morning.   metoprolol succinate (TOPROL XL) 25 MG 24 hr tablet  Take 0.5 tablets (12.5 mg total) by mouth daily.   nitroGLYCERIN (MINITRAN) 0.2 mg/hr patch Place 1 patch (0.2 mg total) onto the skin daily. Use I/4 patch q 24 hrs (Patient not taking: Reported on 12/31/2021)   Probiotic Product (PROBIOTIC PO) Take 1 capsule by mouth at bedtime.   QUEtiapine (SEROQUEL) 25 MG tablet Take 1 tablet (25 mg total) by mouth at bedtime.   tamsulosin (FLOMAX) 0.4 MG CAPS capsule TAKE 1 CAPSULE BY MOUTH ONCE DAILY WITH SUPPER (Patient taking differently: Take 0.4 mg by mouth daily after supper.)   timolol (TIMOPTIC) 0.5 % ophthalmic solution Place 1 drop into both eyes 2 (two) times daily.   vitamin B-12 (CYANOCOBALAMIN) 1000 MCG tablet Take 1,000 mcg by mouth every morning.   No facility-administered encounter medications on file as of 01/15/2022.   Thank you for the opportunity to participate in the care of Mr. Chandler.  The palliative care team will continue to follow. Please call our office at [phone] if we can be of additional assistance.   Teyon Odette, DO ,   COVID-19 PATIENT SCREENING TOOL Asked and negative response unless otherwise noted:  Have you had symptoms of covid, tested positive or been in contact with someone with symptoms/positive test in the past 5-10 days?

## 2022-01-16 ENCOUNTER — Telehealth: Payer: Self-pay

## 2022-01-16 DIAGNOSIS — M6281 Muscle weakness (generalized): Secondary | ICD-10-CM | POA: Diagnosis not present

## 2022-01-16 DIAGNOSIS — R2689 Other abnormalities of gait and mobility: Secondary | ICD-10-CM | POA: Diagnosis not present

## 2022-01-16 NOTE — Telephone Encounter (Signed)
The rehab facility should help her with this, once he leaves rehab he will need to follow up in office with Dr Hyacinth Meeker and any additional orders can be given at that time. Also a lot of assistance is  out of pocket, there is some resources depending on her insurance that she can call and they can send [place] paperwork to complete as well.

## 2022-01-16 NOTE — Telephone Encounter (Signed)
Patient wife Atari Novick called and states that patient is at [facility] on [address]. She states that patient will be discharged on Saturday 01/18/2022 because insurance wont cover any further. Patient wife states that when patient comes home she needs help. She expressed that she cannot take care of him by herself anymore. In the past patient wife states that Earl faxed papers to [facility]. Message routed to Abbey Chatters, NP due to PCP Hyacinth Meeker Bertram Millard, MD being out office. Please Advise.

## 2022-01-16 NOTE — Telephone Encounter (Signed)
I called patient wife Phineas Mcenroe and let her know. She understood and states she will follow up with [place] when she needs to.

## 2022-01-17 ENCOUNTER — Non-Acute Institutional Stay: Payer: Self-pay | Admitting: Internal Medicine

## 2022-01-23 ENCOUNTER — Encounter: Payer: Self-pay | Admitting: Orthopedic Surgery

## 2022-01-23 ENCOUNTER — Ambulatory Visit (INDEPENDENT_AMBULATORY_CARE_PROVIDER_SITE_OTHER): Payer: Medicare HMO | Admitting: Orthopedic Surgery

## 2022-01-23 VITALS — BP 136/88 | HR 68 | Temp 97.1°F | Resp 20 | Ht 70.0 in | Wt 260.6 lb

## 2022-01-23 DIAGNOSIS — G4733 Obstructive sleep apnea (adult) (pediatric): Secondary | ICD-10-CM | POA: Diagnosis not present

## 2022-01-23 DIAGNOSIS — F01B3 Vascular dementia, moderate, with mood disturbance: Secondary | ICD-10-CM

## 2022-01-23 DIAGNOSIS — Z66 Do not resuscitate: Secondary | ICD-10-CM

## 2022-01-23 DIAGNOSIS — N401 Enlarged prostate with lower urinary tract symptoms: Secondary | ICD-10-CM

## 2022-01-23 DIAGNOSIS — R441 Visual hallucinations: Secondary | ICD-10-CM | POA: Diagnosis not present

## 2022-01-23 DIAGNOSIS — R351 Nocturia: Secondary | ICD-10-CM | POA: Diagnosis not present

## 2022-01-23 DIAGNOSIS — R531 Weakness: Secondary | ICD-10-CM

## 2022-01-23 DIAGNOSIS — Z9989 Dependence on other enabling machines and devices: Secondary | ICD-10-CM

## 2022-01-23 NOTE — Patient Instructions (Addendum)
Neurology referral made  Try mustard for muscle cramps  Smaller weights for arms and strength

## 2022-01-23 NOTE — Progress Notes (Signed)
Careteam: Patient Care Team: Frederica Kuster, MD as PCP - General (Family Medicine)  Seen by: Hazle Nordmann, AGNP-C  PLACE OF SERVICE:  [facility]  Advanced Directive information    No Known Allergies  Chief Complaint  Patient presents with   Hospitalization Follow-up    Patient presents today for a hospital follow-up. He was admitted into [facility] hospital from 12/30/21-01/07/22 for Acute encephalopathy.        HPI: Patient is a 86 y.o. male seen today for f/u s/p hospitalization at [facility] 07/03- 07/11 due to acute encephalopathy.   He was having hallucinations prior to hospitalization. Encephalopathy thought to be related with polypharmacy, OSA, and obesity hypoventilation syndrome. He was taken off Wellbutrin, Lexapro, Flexeril, and Mirapex. He was evaluated by neurology- suspected delirium vs underlying dementia, recommended neurology consult with neurocognitive testing. He was also started on Seroquel for behaviors. He was discharged to [facility] rehab x 1 week. He is now home with wife. [facility] coming daily 8-5- PT/OT/nurse aid. No recent falls, he is walking with rolator. Sleeping has improved with CPAP use, has trouble wearing it at times. Continues to have frequent urination with Flomax. He has been evaluated by urology in past, did not want to have surgery at this time. He is urinating into a urinal about 10x/night. He was a DNR during past hospitalization and [facility], requesting DNR gold form today.     Review of Systems:  Review of Systems  Constitutional:  Negative for chills, fever, malaise/fatigue and weight loss.  HENT:  Positive for sore throat.   Eyes:  Negative for redness.  Respiratory:  Negative for cough, shortness of breath and wheezing.   Cardiovascular:  Positive for leg swelling. Negative for chest pain and orthopnea.  Gastrointestinal:  Negative for abdominal pain, blood in stool, constipation, diarrhea, heartburn, nausea and vomiting.   Genitourinary:  Positive for frequency. Negative for dysuria, flank pain, hematuria and urgency.  Musculoskeletal:  Positive for myalgias. Negative for falls.  Skin:  Negative for rash.  Neurological:  Positive for weakness. Negative for dizziness and headaches.  Psychiatric/Behavioral:  Positive for depression and memory loss. Negative for hallucinations. The patient is nervous/anxious.     Past Medical History:  Diagnosis Date   Anxiety and depression    BPH (benign prostatic hyperplasia)    Coronary artery disease    Fecal incontinence    GERD (gastroesophageal reflux disease)    Glaucoma    Hard of hearing    Headache    History of pneumonia    Hyperlipidemia    Hypertension    Memory impairment    Migraine headache    Neck fracture (HCC)    2-3/2-21 followed by DR Sandi Carne in [place]    Nocturia    Osteoarthritis    Restrictive lung disease    Sleep apnea    wears CPAP does not know setting    Thyroid disease    Hyperparathyroidism    Urinary frequency    Urinary incontinence    Wears glasses    Past Surgical History:  Procedure Laterality Date   CARDIAC CATHETERIZATION     CIRCUMCISION     COLONOSCOPY     CORONARY ANGIOPLASTY     stents- 08/2019    ESOPHAGOGASTRODUODENOSCOPY     EYE SURGERY Bilateral    cataract   HAND SURGERY Right    HIP ARTHROPLASTY Right    HIP ARTHROPLASTY Left 2006   KNEE CARTILAGE SURGERY Bilateral  LUMBAR LAMINECTOMY     L3 - L4   RETINAL DETACHMENT SURGERY Right    x2   REVERSE SHOULDER ARTHROPLASTY Right 05/29/2016   Procedure: REVERSE SHOULDER ARTHROPLASTY;  Surgeon: Francena Hanly, MD;  Location: [facility] OR;  Service: Orthopedics;  Laterality: Right;   SHOULDER SURGERY Bilateral    TOTAL HIP REVISION Right 04/04/2020   Procedure: Right hip acetabular liner revision;  Surgeon: [name], MD;  Location: [facility] ORS;  Service: Orthopedics;  Laterality: Right;    Social History:   reports that he has quit smoking. He has  been exposed to tobacco smoke. He has never used smokeless tobacco. He reports current alcohol use. He reports that he does not use drugs.  History reviewed. No pertinent family history.  Medications: Patient's Medications  New Prescriptions   No medications on file  Previous Medications   ACETAMINOPHEN (TYLENOL) 500 MG TABLET    Take 1,000 mg by mouth 2 (two) times daily.   AMLODIPINE (NORVASC) 5 MG TABLET    Take 1 tablet (5 mg total) by mouth daily.   ASPIRIN 81 MG TABLET    Take 81 mg by mouth at bedtime.   CHOLECALCIFEROL (VITAMIN D) 2000 UNITS CAPS    Take 2,000 Units by mouth every evening.   COLESTIPOL (COLESTID) 1 G TABLET    Take 1 tablet (1 g total) by mouth 2 (two) times daily.   FLUTICASONE FUROATE 200 MCG/ACT AEPB    Inhale 1 puff into the lungs daily at 12 noon.   IPRATROPIUM-ALBUTEROL (DUONEB) 0.5-2.5 (3) MG/3ML SOLN    Take 3 mLs by nebulization every 6 (six) hours as needed.   LATANOPROST (XALATAN) 0.005 % OPHTHALMIC SOLUTION    Place 1 drop into both eyes at bedtime.   LIDOCAINE EX    Place 1 patch onto the skin daily as needed (back pain).   MAGNESIUM OXIDE (MAG-OX) 400 MG TABLET    Take 400 mg by mouth every morning.   METOPROLOL SUCCINATE ER PO    Take 3 mg by mouth daily.   NITROGLYCERIN (MINITRAN) 0.2 MG/HR PATCH    Place 1 patch (0.2 mg total) onto the skin daily. Use I/4 patch q 24 hrs   OMEPRAZOLE (PRILOSEC) 20 MG CAPSULE    Take 20 mg by mouth 2 (two) times daily before a meal.   QUETIAPINE (SEROQUEL) 25 MG TABLET    Take 1 tablet (25 mg total) by mouth at bedtime.   TAMSULOSIN (FLOMAX) 0.4 MG CAPS CAPSULE    TAKE 1 CAPSULE BY MOUTH ONCE DAILY WITH SUPPER   TIMOLOL (TIMOPTIC) 0.5 % OPHTHALMIC SOLUTION    Place 1 drop into both eyes 2 (two) times daily.   VITAMIN B-12 (CYANOCOBALAMIN) 1000 MCG TABLET    Take 1,000 mcg by mouth every morning.  Modified Medications   No medications on file  Discontinued Medications   FAMOTIDINE (PEPCID) 20 MG TABLET    Take 1  tablet (20 mg total) by mouth daily as needed for heartburn or indigestion.   METOPROLOL SUCCINATE (TOPROL XL) 25 MG 24 HR TABLET    Take 0.5 tablets (12.5 mg total) by mouth daily.   PROBIOTIC PRODUCT (PROBIOTIC PO)    Take 1 capsule by mouth at bedtime.    Physical Exam:  Vitals:   01/23/22 1023  BP: 136/88  Pulse: 68  Resp: 20  Temp: (!) 97.1 F (36.2 C)  SpO2: 97%  Weight: 260 lb 9.6 oz (118.2 kg)  Height: 5\' 10"  (1.778  m)   Body mass index is 37.39 kg/m. Wt Readings from Last 3 Encounters:  01/23/22 260 lb 9.6 oz (118.2 kg)  01/07/22 250 lb 3.6 oz (113.5 kg)  12/26/21 250 lb (113.4 kg)    Physical Exam Vitals reviewed.  Constitutional:      General: He is awake. He is not in acute distress. HENT:     Head: Normocephalic.  Eyes:     General:        Right eye: No discharge.        Left eye: No discharge.  Cardiovascular:     Rate and Rhythm: Normal rate and regular rhythm.     Pulses: Normal pulses.     Heart sounds: Normal heart sounds.  Pulmonary:     Effort: Pulmonary effort is normal. No respiratory distress.     Breath sounds: Normal breath sounds. No wheezing.  Abdominal:     General: Bowel sounds are normal. There is no distension.     Palpations: Abdomen is soft.     Tenderness: There is no abdominal tenderness.  Musculoskeletal:     Cervical back: Neck supple.     Right lower leg: No edema.     Left lower leg: No edema.  Skin:    General: Skin is warm and dry.     Capillary Refill: Capillary refill takes less than 2 seconds.  Neurological:     General: No focal deficit present.     Mental Status: Mental status is at baseline.     Motor: Weakness present.     Gait: Gait abnormal.     Comments: rolator  Psychiatric:        Mood and Affect: Mood normal.        Behavior: Behavior normal. Behavior is cooperative.     Comments: Very pleasant, follows commands, alert to self/person/place     Labs reviewed: Basic Metabolic Panel: Recent Labs     03/11/21 1032 11/14/21 1039 12/26/21 1138 12/31/21 0235 01/01/22 0212 01/02/22 0233 01/03/22 0146 01/04/22 0144  NA 143 142   < > 142   < > 142 139 142  K 3.7 4.1   < > 3.6   < > 3.8 3.7 4.0  CL 111* 107   < > 109   < > 110 109 112*  CO2 26 24   < > 23   < > 22 20* 20*  GLUCOSE 96 86   < > 92   < > 93 98 109*  BUN 19 22   < > 17   < > 15 24* 23  CREATININE 1.30* 1.18   < > 1.21   < > 1.16 1.46* 1.17  CALCIUM 9.3 9.2   < > 9.2   < > 9.0 8.7* 9.0  MG 1.9  --   --  1.8  --   --  1.9  --   TSH  --  2.00  --  1.991  --   --   --   --    < > = values in this interval not displayed.   Liver Function Tests: Recent Labs    12/30/21 1316 12/31/21 0235 01/02/22 0233  AST 30 28 37  ALT 48* 44 59*  ALKPHOS 58 65 70  BILITOT 1.0 1.2 1.4*  PROT 7.1 6.2* 6.1*  ALBUMIN 4.2 3.3* 3.2*   No results for input(s): "LIPASE", "AMYLASE" in the last 8760 hours. Recent Labs    12/31/21 0235  AMMONIA 12  CBC: Recent Labs    11/14/21 1039 12/30/21 1316 12/31/21 0235 01/01/22 0212 01/03/22 0146  WBC 5.9 6.6 7.1 7.1 9.4  NEUTROABS 3,682 4.7 4.9  --   --   HGB 14.6 14.5 14.8 15.0 15.0  HCT 43.3 43.6 43.9 44.1 45.1  MCV 92.9 94.4 95.0 92.8 95.1  PLT 149 161 151 161 150   Lipid Panel: Recent Labs    03/11/21 1032 11/14/21 1039  CHOL 136 204*  HDL 31* 30*  LDLCALC 75 136*  TRIG 201* 250*  CHOLHDL 4.4 6.8*   TSH: Recent Labs    11/14/21 1039 12/31/21 0235  TSH 2.00 1.991   A1C: Lab Results  Component Value Date   HGBA1C 5.3 03/11/2021     Assessment/Plan 1. 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 Lewy body dementia noted by neurology - doing well on Seroquel  - Ambulatory referral to Neurology  2. Visual hallucinations - resolved  3. Weakness - rehab x 1 week at  [facility] - home now - no recent falls - cont HH PT/OT  4. Benign prostatic hyperplasia with nocturia - ongoing - evaluated by urology in past - going 10x/every night - does not want to see specialist - discussed avoiding bladder irritants life caffeine - cont Flomax  5. OSA on CPAP - cont CPAP use  6. Morbid obesity (HCC) - BMI 37.39 - recommend reducing calories < 2000/day  8. Do not resuscitate - yellow DNR for completed today  Total time: 41 minutes. Greater than 50% of total time spent doing patient education regarding health maintenance, delirium vs dementia, urinary frequency, OSA, and symptom/medication management.    Next appt: 04/24/2022  Hazle Nordmann, Juel Burrow  [facility] [phone]

## 2022-01-24 ENCOUNTER — Telehealth: Payer: Self-pay

## 2022-01-24 ENCOUNTER — Telehealth: Payer: Self-pay | Admitting: *Deleted

## 2022-01-24 ENCOUNTER — Other Ambulatory Visit: Payer: Self-pay | Admitting: Orthopedic Surgery

## 2022-01-24 DIAGNOSIS — F01B3 Vascular dementia, moderate, with mood disturbance: Secondary | ICD-10-CM

## 2022-01-24 DIAGNOSIS — R269 Unspecified abnormalities of gait and mobility: Secondary | ICD-10-CM

## 2022-01-24 DIAGNOSIS — R531 Weakness: Secondary | ICD-10-CM

## 2022-01-24 DIAGNOSIS — J984 Other disorders of lung: Secondary | ICD-10-CM

## 2022-01-24 NOTE — Telephone Encounter (Signed)
Patient's wife states that the patient is in need of Duoneb treatment but doesn't have a nebulizer.   Dr. Sharl Ma prescribed the Duoneb but she wonders if we can prescribe the nebulizer to [facility] in [place].   Rosemarie aware she may not get a call back today.

## 2022-01-24 NOTE — Telephone Encounter (Signed)
Nebulizer order fax as requested.

## 2022-01-24 NOTE — Telephone Encounter (Signed)
RoseMarie, wife called and stated that she spoke with Kennyth Arnold today at [facility] and she stated that patient needs a new Referral placed for [place] PT/OT.   Has left [facility]. [facility] transferred everything to [facility] to continue PT/OT.   Frances Furbish is needing a referral from PCP, stated that patient saw Amy this week and requesting Amy to place the referral.   Requesting referral to be placed.   Stacy with [facility].  [phone] Fax: [phone]

## 2022-01-24 NOTE — Progress Notes (Signed)
Pt could not be seen prior to discharge from [facility].  If palliative remains appropriate, we can see him in the home setting if a referral is entered by PCP.

## 2022-01-24 NOTE — Telephone Encounter (Signed)
Referral made in [facility] for [facility].

## 2022-01-24 NOTE — Telephone Encounter (Signed)
Discussed with Rosemarie.

## 2022-01-27 ENCOUNTER — Other Ambulatory Visit: Payer: Self-pay | Admitting: *Deleted

## 2022-01-27 DIAGNOSIS — J984 Other disorders of lung: Secondary | ICD-10-CM

## 2022-01-27 MED ORDER — NEBULIZER/TUBING/MOUTHPIECE KIT: PACK | 0 refills | Status: DC

## 2022-01-28 ENCOUNTER — Telehealth: Payer: Self-pay

## 2022-01-28 NOTE — Telephone Encounter (Signed)
Teams message sent to offsite referral team to inquire about a status update.  Awaiting response

## 2022-01-28 NOTE — Telephone Encounter (Signed)
-----   Message from Elveria Royals, CMA sent at 01/28/2022  1:47 PM EDT ----- Please look into status of referral placed for [facility].

## 2022-01-30 ENCOUNTER — Telehealth: Payer: Self-pay

## 2022-01-30 NOTE — Telephone Encounter (Signed)
Nola with [facility] home [facility] called wanting verbal order for PT 1xwk for 8 weeks for strengthening and OT for evaluation.  Verbal was given.  Message routed to Dr. Bertram Millard. Hyacinth Meeker [facility])

## 2022-01-30 NOTE — Telephone Encounter (Signed)
See referral for further follow-up

## 2022-02-07 ENCOUNTER — Telehealth: Payer: Self-pay | Admitting: *Deleted

## 2022-02-07 NOTE — Telephone Encounter (Signed)
Nola with [facility] called and stated that patient was referred for Vascular Dementia.   For Coding purposes they cannot use Vascular Dementia. They need a Underlying Diagnosis of his Vascular Dementia, like stroke or any other underlying cause.   If there is not any other Diagnosis can they use Dementia Unspecified.   Please Advise.

## 2022-02-10 IMAGING — MR MR HEAD WO/W CM
16 series · 48 of 48 positions shown · IV contrast (gadavist)
Comparison: CT head 09/17/2019

CLINICAL DATA: Unsteadiness.

EXAM:
MRI HEAD WITHOUT AND WITH CONTRAST
TECHNIQUE: Multiplanar, multiecho pulse sequences of the brain and surrounding
structures were obtained without and with intravenous contrast.
Patient has neurostimulator. MRI performed according to manufactures
recommendations.
CONTRAST:  10mL GADAVIST GADOBUTROL 1 MMOL/ML IV SOLN

[Series 6: DWI · axial · 3.0mm · 1.36mm/px · z∈[-13,+127]mm · 5 of 108 slices shown (1 of 4)]
[im 1/108]
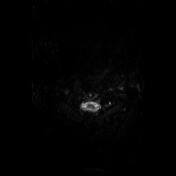
[im 27/108]
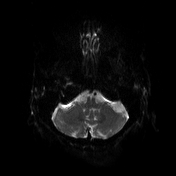
[im 54/108]
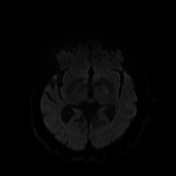
[im 81/108]
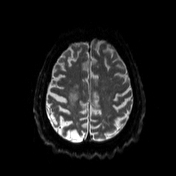
[im 108/108]
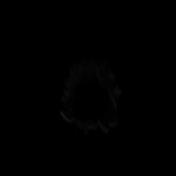

[Series 7: DWI · axial · 3.0mm · 1.36mm/px · z∈[-13,+127]mm · 3 of 54 slices shown (2 of 4)]
[im 1/54]
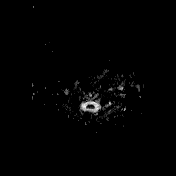
[im 27/54]
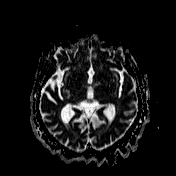
[im 54/54]
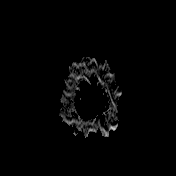

[Series 8: mip_images(sw) · axial · 32.0mm · 0.75mm/px · 1 of 33 slices shown]
[im 1/33]
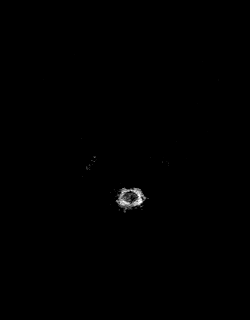

[Series 9: swi_images · axial · 4.0mm · 0.75mm/px · z∈[-7,+130]mm · 2 of 40 slices shown]
[im 1/40]
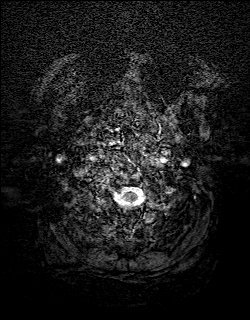
[im 40/40]
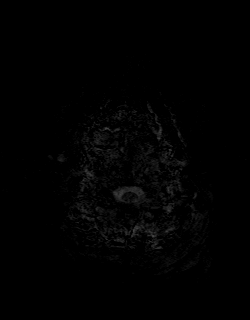

[Series 10: FLAIR · axial · 5.0mm · 0.86mm/px · 1 of 33 slices shown]
[im 1/33]
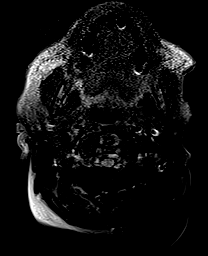

[Series 12: T2 · axial · 5.0mm · 0.45mm/px · 1 of 33 slices shown (1 of 3)]
[im 1/33]
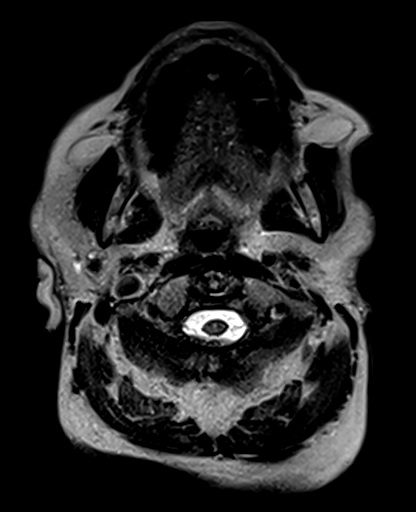

[Series 13: T1 · axial · 1.0mm · 0.94mm/px · z∈[-32,+129]mm · 7 of 176 slices shown]
[im 1/176]
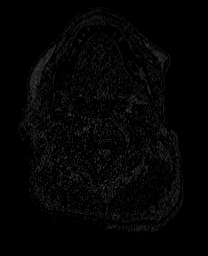
[im 30/176]
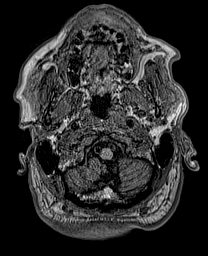
[im 59/176]
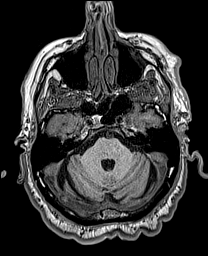
[im 88/176]
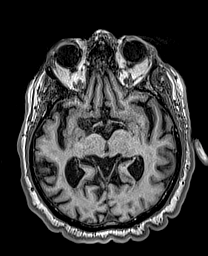
[im 117/176]
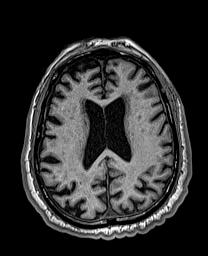
[im 146/176]
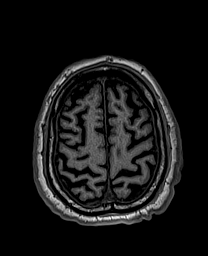
[im 176/176]
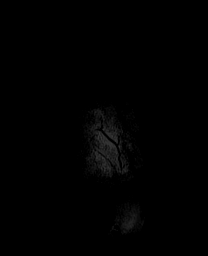

[Series 14: DWI · coronal · 5.0mm · 1.31mm/px · 3 of 64 slices shown (3 of 4)]
[im 1/64]
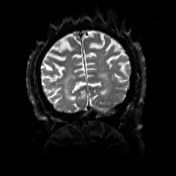
[im 32/64]
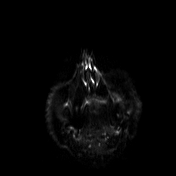
[im 64/64]
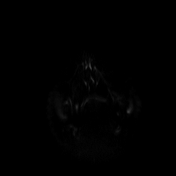

[Series 15: DWI · coronal · 5.0mm · 1.31mm/px · 1 of 32 slices shown (4 of 4)]
[im 1/32]
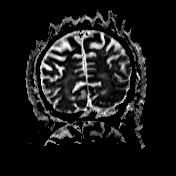

[Series 16: T2 · coronal · 6.0mm · 0.57mm/px · 1 of 22 slices shown (2 of 3)]
[im 1/22]
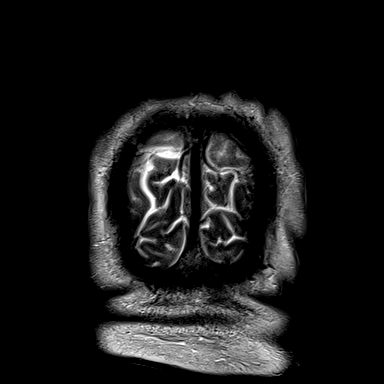

[Series 18: t1_fl2d_sag · sagittal · 5.0mm · 0.47mm/px · 1 of 20 slices shown]
[im 1/20]
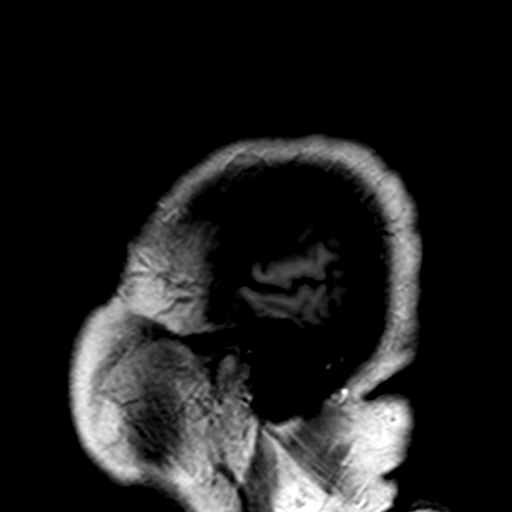

[Series 19: T1 post-contrast · axial · 1.0mm · 0.94mm/px · z∈[-32,+129]mm · 7 of 176 slices shown (1 of 2)]
[im 1/176]
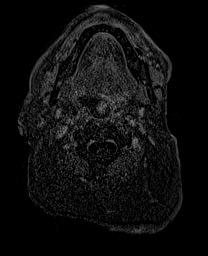
[im 30/176]
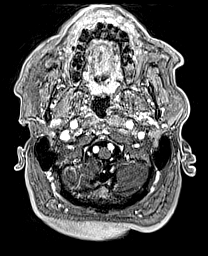
[im 59/176]
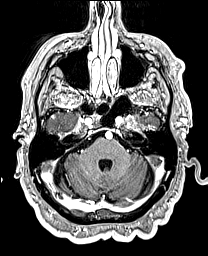
[im 88/176]
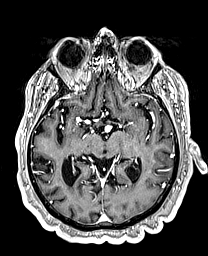
[im 117/176]
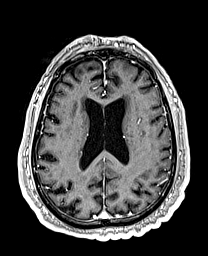
[im 146/176]
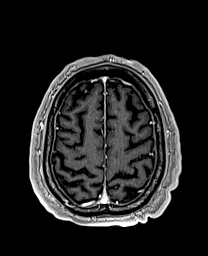
[im 176/176]
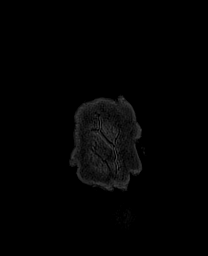

[Series 20: T2 · coronal · 6.0mm · 0.57mm/px · 1 of 24 slices shown (3 of 3)]
[im 1/24]
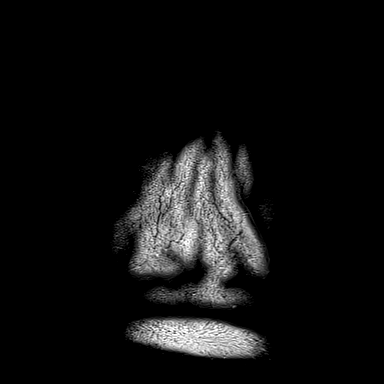

[Series 21: T1 post-contrast · coronal · 6.0mm · 0.43mm/px · 1 of 23 slices shown (2 of 2)]
[im 1/23]
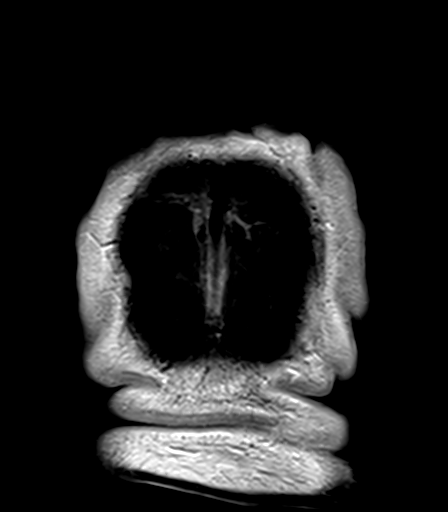

[Series 100: <mpr range> · axial · 1.0mm · 0.50mm/px · z∈[+45,+185]mm · 6 of 157 slices shown]
[im 1/157]
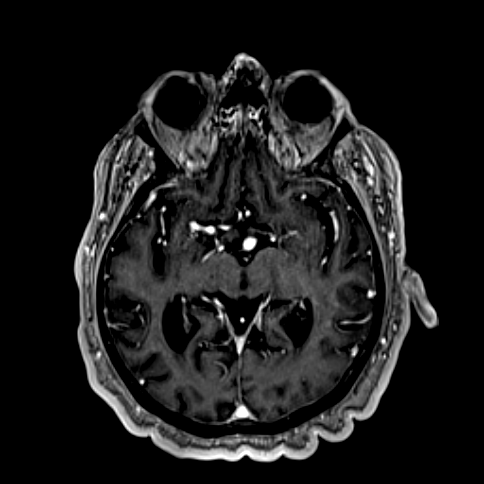
[im 32/157]
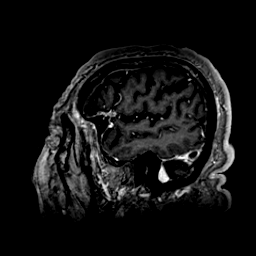
[im 63/157]
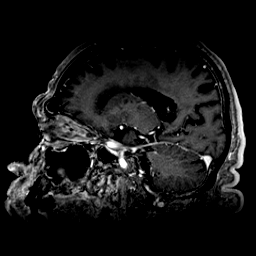
[im 94/157]
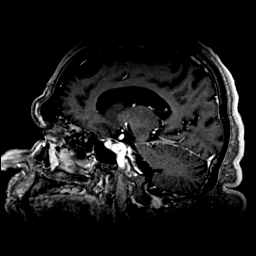
[im 125/157]
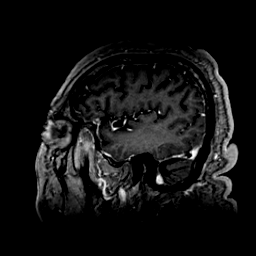
[im 157/157]
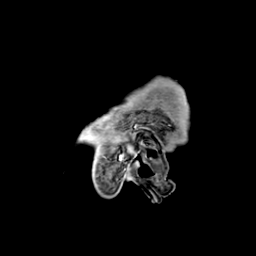

[Series 101: <mpr range(1)> · axial · 1.0mm · 0.50mm/px · z∈[+45,+180]mm · 7 of 177 slices shown]
[im 1/177]
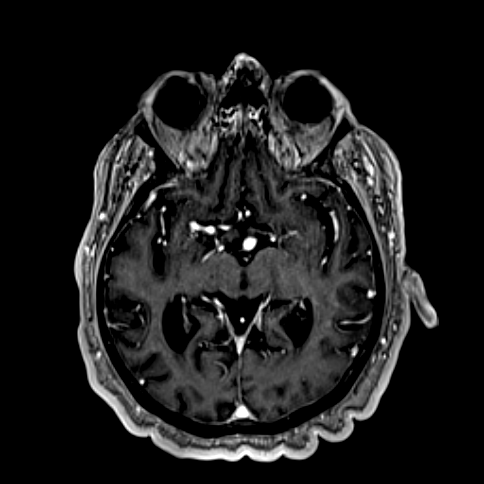
[im 30/177]
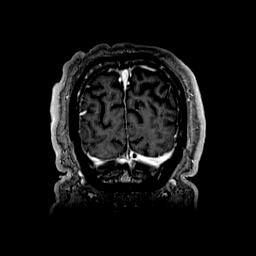
[im 59/177]
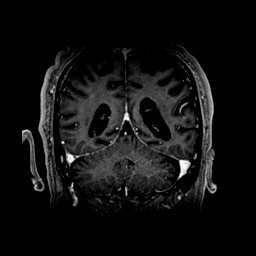
[im 89/177]
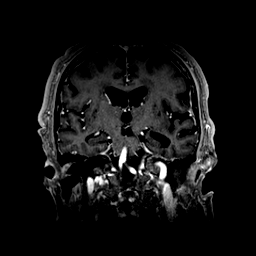
[im 118/177]
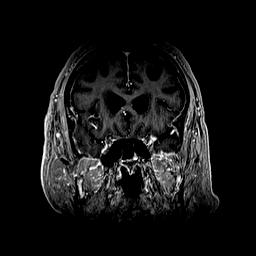
[im 147/177]
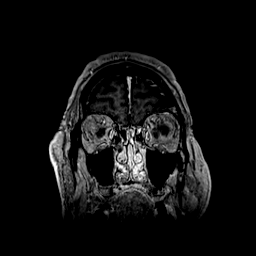
[im 177/177]
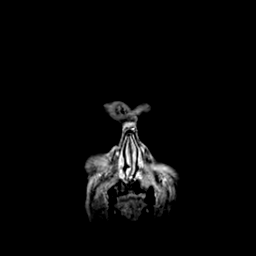

[48 of 48 positions shown; findings below may reference images not displayed]

FINDINGS: Brain: Image quality degraded by mild motion.

Mild to moderate atrophy. Hippocampal atrophy is present
bilaterally. Mild ventricular enlargement consistent with atrophy.

Negative for acute infarct. Negative for hemorrhage or mass.
Moderate chronic microvascular ischemic change in the white matter.

Normal enhancement postcontrast administration.

Vascular: Normal arterial flow voids.

Skull and upper cervical spine: No acute skeletal abnormality. C1-2
arthropathy with prominent pannus behind the dens. No significant
spinal stenosis.

Sinuses/Orbits: Mild mucosal edema paranasal sinuses. Bilateral
ocular surgery. Bilateral retinal detachment.

Other: None
IMPRESSION: No acute abnormality

Mild to moderate atrophy. Moderate chronic microvascular ischemic
changes in the white matter.

## 2022-02-11 NOTE — Telephone Encounter (Signed)
Jeffery Kuster, MD  You 4 minutes ago (8:09 AM)   Maybe metabolic encephalopathy will work for [employer]. Left Message on VM

## 2022-02-17 ENCOUNTER — Telehealth: Payer: Self-pay

## 2022-02-17 MED ORDER — QUETIAPINE FUMARATE 25 MG PO TABS: 25.0000 mg | ORAL_TABLET | Freq: Every day | ORAL | 1 refills | Status: DC

## 2022-02-17 NOTE — Telephone Encounter (Signed)
Patient's wife called requesting refill of medication.

## 2022-03-02 ENCOUNTER — Other Ambulatory Visit: Payer: Self-pay | Admitting: Family Medicine

## 2022-03-04 MED ORDER — MAGNESIUM OXIDE 400 MG PO TABS: 400.0000 mg | ORAL_TABLET | Freq: Every morning | ORAL | 3 refills | Status: DC

## 2022-03-04 NOTE — Telephone Encounter (Signed)
Patient requested refill.  Pended Rx and sent to Dr. Miller for approval.  

## 2022-03-06 ENCOUNTER — Other Ambulatory Visit: Payer: Self-pay | Admitting: Family

## 2022-03-06 DIAGNOSIS — R42 Dizziness and giddiness: Secondary | ICD-10-CM

## 2022-03-11 ENCOUNTER — Encounter: Payer: Self-pay | Admitting: Adult Health

## 2022-03-11 ENCOUNTER — Telehealth (INDEPENDENT_AMBULATORY_CARE_PROVIDER_SITE_OTHER): Payer: Medicare HMO | Admitting: Adult Health

## 2022-03-11 DIAGNOSIS — U071 COVID-19: Secondary | ICD-10-CM | POA: Diagnosis not present

## 2022-03-11 MED ORDER — MOLNUPIRAVIR EUA 200MG CAPSULE: 4.0000 | ORAL_CAPSULE | Freq: Two times a day (BID) | ORAL | 0 refills | Status: AC

## 2022-03-11 NOTE — Patient Instructions (Signed)
Quarantine and Isolation Quarantine and isolation refer to local and travel restrictions to protect the public and travelers from contagious diseases that constitute a public health threat. Contagious diseases are diseases that can spread from one person to another. Quarantine and isolation help to protect the public by preventing exposure to people who have or may have a contagious disease. Isolation separates people who are sick with a contagious disease from people who are not sick. Quarantine separates and restricts the movement of people who were exposed to a contagious disease to see if they become sick. You may be put in quarantine or isolation if you have been exposed to or diagnosed with any of the following diseases: Severe acute respiratory syndromes, such as COVID-19. Cholera. Diphtheria. Tuberculosis. Plague. Smallpox. Yellow fever. Viral hemorrhagic fevers, such as [place], Ebola, and [place]. When to quarantine or isolate Follow these rules, whether you have been vaccinated or not: Stay home and isolate from others when you are sick with a contagious disease. Isolate when you test positive for a contagious disease, even if you do not have symptoms. Isolate if you are sick and suspect that you may have a contagious disease. If you suspect that you have a contagious disease, get tested. If your test results are negative, you can end your isolation. If your test results are positive, follow the full isolation recommendations as told by your health care provider or local health authorities. Quarantine and stay away from others when you have been in close contact with someone who has tested positive for a contagious disease. Close contact is defined as being less than 6 ft (1.8 m) away from an infected person for a total of 15 minutes or more over a 24-hour period. Do not go to places where you are unable to wear a mask, such as restaurants and some gyms. Stay home and separate  from others as much as possible. Avoid being around people who may get very sick from the contagious disease that you have. Use a separate bathroom, if possible. Do not travel. For travel guidance, visit the CDC's travel webpage at wwwnc.cdc.gov/travel/ Follow these instructions at home: Medicines  Take over-the-counter and prescription medicines as told by your health care provider. Finish all antibiotic medicine even when you start to feel better. Stay up to date with all your vaccines. Get scheduled vaccines and boosters as recommended by your health care provider. Lifestyle Wear a high-quality mask if you must be around others at home and in public, if recommended. Improve air flow (ventilation) at home to help prevent the disease from spreading to other people, if possible. Do not share personal household items, like cups, towels, and utensils. Practice everyday hygiene and cleaning. General instructions Talk to your health care provider if you have a weakened body defense system (immune system). People with a weakened immune system may have a reduced immune response to vaccines. You may need to follow current prevention measures, including wearing a well-fitting mask, avoiding crowds, and avoiding poorly ventilated indoor places. Monitor symptoms and follow health care provider instructions, which may include resting, drinking fluids, and taking medicines. Follow specific isolation and quarantine recommendations if you are in places that can lead to disease outbreaks, such as correctional and detention facilities, homeless shelters, and cruise ships. Return to your normal activities as told by your health care provider. Ask your health care provider what activities are safe for you. Keep all follow-up visits. This is important. Where to find more information CDC: www.cdc.gov/quarantine/index.html Contact   a health care provider if: You have a fever. You have signs and symptoms that  return or get worse after isolation. Get help right away if: You have difficulty breathing. You have chest pain. These symptoms may be an emergency. Get help right away. Call 911. Do not wait to see if the symptoms will go away. Do not drive yourself to the hospital. Summary Isolation and quarantine help protect the public by preventing exposure to people who have or may have a contagious disease. Isolate when you are sick or when you test positive, even if you do not have symptoms. Quarantine and stay away from others when you have been in close contact with someone who has tested positive for a contagious disease. This information is not intended to replace advice given to you by your health care provider. Make sure you discuss any questions you have with your health care provider. Document Revised: 06/27/2021 Document Reviewed: 06/06/2021 Elsevier Patient Education  2023 Elsevier Inc.  

## 2022-03-11 NOTE — Progress Notes (Signed)
Jeffery [name], Jeffery [name] are scheduled for a virtual visit with your provider today.    Just as we do with appointments in the office, we must obtain your consent to participate.  Your consent will be active for this visit and any virtual visit you may have with one of our providers in the next 365 days.    If you have a MyChart account, I can also send a copy of this consent to you electronically.  All virtual visits are billed to your insurance company just like a traditional visit in the office.  As this is a virtual visit, video technology does not allow for your provider to perform a traditional examination.  This may limit your provider's ability to fully assess your condition.  If your provider identifies any concerns that need to be evaluated in person or the need to arrange testing such as labs, EKG, etc, we will make arrangements to do so.    Although advances in technology are sophisticated, we cannot ensure that it will always work on either your end or our end.  If the connection with a video visit is poor, we may have to switch to a telephone visit.  With either a video or telephone visit, we are not always able to ensure that we have a secure connection.   I need to obtain your verbal consent now.   Are you willing to proceed with your visit today?   Jeffery [name] has provided verbal consent on 03/11/2022 for a virtual visit (video or telephone).  This service is provided via telemedicine  No vital signs collected/recorded due to the encounter was a telemedicine visit.   Location of patient (ex: home, work):  home  Patient consents to a telephone visit:  yes  Location of the provider (ex: office, home):  [facility]   Name of any referring provider:  Durenda Age, NP   Names of all persons participating in the telemedicine service and their role in the encounter:  patient, spouse, [facility] Wynelle Cleveland, NP  Time  spent on call:  5 min   Debe Coder, [place] 03/11/2022  4:49 PM    DATE:  03/11/2022 MRN:  [id number]  BIRTHDAY: 21-Sep-1935   Contact Information     Name Relation Home Work Mobile   Chamberlain Spouse [phone]  [phone]   Huslia Daughter [phone]  [phone]   Keir,Dianna Daughter   [phone]        Code Status History     Date Active Date Inactive Code Status Order ID Comments User Context   01/05/2022 1554 01/07/2022 1951 DNR [id number]  Oswald Hillock, MD Inpatient   12/30/2021 2118 01/05/2022 1554 Full Code [id number]  Rhetta Mura, DO Inpatient   04/04/2020 1333 04/05/2020 1805 Full Code [id number]  Gaynelle Arabian, MD Inpatient   05/29/2016 1852 05/30/2016 1452 Full Code [id number]  Jenetta Loges, PA-C Inpatient    Questions for Most Recent Historical Code Status (Order [id number])     Question Answer   In the event of cardiac or respiratory ARREST Do not call a "code blue"   In the event of cardiac or respiratory ARREST Do not perform Intubation, CPR, defibrillation or ACLS   In the event of cardiac or respiratory ARREST Use medication by any route, position, wound care, and other measures to relive pain and suffering. May use oxygen, suction and manual treatment of airway obstruction as needed for comfort.  Chief Complaint  Patient presents with   Acute Visit    Patient is here for covid positive infection    HISTORY OF PRESENT ILLNESS: This is an 86 year old who tested positive for COVID-19 home test 2 days ago. He has  body aches runny nose, productive cough, with  whitish phlegm. O2 sat on room air were 94-96%. He has PMH of dementia,depression, CAD s/p PCI with stent placement in February 2021, hypertension, restrictive lung disease and BPH.    PAST MEDICAL HISTORY:  Past Medical History:  Diagnosis Date   Anxiety and depression    BPH (benign prostatic hyperplasia)    Coronary artery disease    Fecal  incontinence    GERD (gastroesophageal reflux disease)    Glaucoma    Hard of hearing    Headache    History of pneumonia    Hyperlipidemia    Hypertension    Memory impairment    Migraine headache    Neck fracture ([place])    2-3/2-21 followed by DR Ashok Croon in [place]    Nocturia    Osteoarthritis    Restrictive lung disease    Sleep apnea    wears CPAP does not know setting    Thyroid disease    Hyperparathyroidism    Urinary frequency    Urinary incontinence    Wears glasses      CURRENT MEDICATIONS: Reviewed  Patient's Medications  New Prescriptions   MOLNUPIRAVIR EUA (LAGEVRIO) 200 MG CAPS CAPSULE    Take 4 capsules (800 mg total) by mouth 2 (two) times daily for 5 days.  Previous Medications   ACETAMINOPHEN (TYLENOL) 500 MG TABLET    Take 1,000 mg by mouth 2 (two) times daily.   AMLODIPINE (NORVASC) 5 MG TABLET    Take 1 tablet (5 mg total) by mouth daily.   ASPIRIN 81 MG TABLET    Take 81 mg by mouth at bedtime.   CHOLECALCIFEROL (VITAMIN D) 2000 UNITS CAPS    Take 2,000 Units by mouth every evening.   COLESTIPOL (COLESTID) 1 G TABLET    Take 1 tablet (1 g total) by mouth 2 (two) times daily.   FLUTICASONE FUROATE 200 MCG/ACT AEPB    Inhale 1 puff into the lungs daily at 12 noon.   IPRATROPIUM-ALBUTEROL (DUONEB) 0.5-2.5 (3) MG/3ML SOLN    Take 3 mLs by nebulization every 6 (six) hours as needed.   LATANOPROST (XALATAN) 0.005 % OPHTHALMIC SOLUTION    Place 1 drop into both eyes at bedtime.   LIDOCAINE EX    Place 1 patch onto the skin daily as needed (back pain).   MAGNESIUM OXIDE (MAG-OX) 400 MG TABLET    Take 1 tablet (400 mg total) by mouth every morning.   METOPROLOL SUCCINATE ER PO    Take 3 mg by mouth daily.   NITROGLYCERIN (MINITRAN) 0.2 MG/HR PATCH    Place 1 patch (0.2 mg total) onto the skin daily. Use I/4 patch q 24 hrs   OMEPRAZOLE (PRILOSEC) 20 MG CAPSULE    Take 20 mg by mouth 2 (two) times daily before a meal.   QUETIAPINE (SEROQUEL) 25 MG TABLET     Take 1 tablet (25 mg total) by mouth at bedtime.   RESPIRATORY THERAPY SUPPLIES (NEBULIZER/TUBING/MOUTHPIECE) KIT    Use with Nebulizer medication every 6 hours as needed Dx:J98.4   TAMSULOSIN (FLOMAX) 0.4 MG CAPS CAPSULE    TAKE 1 CAPSULE BY MOUTH ONCE DAILY WITH SUPPER   TIMOLOL (TIMOPTIC) 0.5 %  OPHTHALMIC SOLUTION    Place 1 drop into both eyes 2 (two) times daily.   VITAMIN B-12 (CYANOCOBALAMIN) 1000 MCG TABLET    Take 1,000 mcg by mouth every morning.  Modified Medications   No medications on file  Discontinued Medications   No medications on file     No Known Allergies   REVIEW OF SYSTEMS:  GENERAL: no change in appetite, no fatigue, no weight changes, no fever, chills or weakness SKIN: Denies rash, itching, wounds, ulcer sores, or nail abnormality EYES: Denies change in vision, dry eyes, eye pain, itching or discharge EARS: Denies change in hearing, ringing in ears, or earache NOSE: Has nasal congestion  MOUTH and THROAT: Denies oral discomfort, gingival pain or bleeding, pain from teeth or hoarseness   RESPIRATORY: has productive cough, CARDIAC: no chest pain, edema or palpitations GI: no abdominal pain, diarrhea, constipation, heart burn, nausea or vomiting GU: Denies dysuria, frequency, hematuria, incontinence, or discharge MUSCULOSKELETAL: Denies joit pain, muscle pain, back pain, restricted movement, or unusual weakness NEUROLOGICAL: Denies dizziness, syncope, numbness, or headache PSYCHIATRIC: Denies feeling of depression or anxiety. No report of hallucinations, insomnia, paranoia, or agitation     LABS/RADIOLOGY: Labs reviewed: Basic Metabolic Panel: Recent Labs    12/31/21 0235 01/01/22 0212 01/02/22 0233 01/03/22 0146 01/04/22 0144  NA 142   < > 142 139 142  K 3.6   < > 3.8 3.7 4.0  CL 109   < > 110 109 112*  CO2 23   < > 22 20* 20*  GLUCOSE 92   < > 93 98 109*  BUN 17   < > 15 24* 23  CREATININE 1.21   < > 1.16 1.46* 1.17  CALCIUM 9.2   < > 9.0 8.7*  9.0  MG 1.8  --   --  1.9  --    < > = values in this interval not displayed.   Liver Function Tests: Recent Labs    12/30/21 1316 12/31/21 0235 01/02/22 0233  AST 30 28 37  ALT 48* 44 59*  ALKPHOS 58 65 70  BILITOT 1.0 1.2 1.4*  PROT 7.1 6.2* 6.1*  ALBUMIN 4.2 3.3* 3.2*   No results for input(s): "LIPASE", "AMYLASE" in the last 8760 hours. Recent Labs    12/31/21 0235  AMMONIA 12   CBC: Recent Labs    11/14/21 1039 12/30/21 1316 12/31/21 0235 01/01/22 0212 01/03/22 0146  WBC 5.9 6.6 7.1 7.1 9.4  NEUTROABS 3,682 4.7 4.9  --   --   HGB 14.6 14.5 14.8 15.0 15.0  HCT 43.3 43.6 43.9 44.1 45.1  MCV 92.9 94.4 95.0 92.8 95.1  PLT 149 161 151 161 150   A1C: Invalid input(s): "A1C" Lipid Panel: Recent Labs    11/14/21 1039  HDL 30*   Cardiac Enzymes: Recent Labs    12/31/21 0235  CKTOTAL 112   BNP: Invalid input(s): "POCBNP" CBG: Recent Labs    01/05/22 2235  GLUCAP 103*      No results found.  ASSESSMENT/PLAN:  1. COVID-19 virus infection - molnupiravir EUA (LAGEVRIO) 200 mg CAPS capsule; Take 4 capsules (800 mg total) by mouth 2 (two) times daily for 5 days.  Dispense: 40 capsule; Refill: 0 -  instructed to eat healthy diet and oral fuids -  has 3 COVID-19 vaccines -  isolate for 10 days   Time spent on non face to face visit:  12 minute  The patient gave consent to this video visit. Explained to  the patient the risk and privacy issue that was involved with this video visit.   The patient was advised to call back and ask for an in-person evaluation if the symptoms worsen or if the condition fails to improve.   Durenda Age, NP [employer] [phone]

## 2022-03-16 NOTE — Progress Notes (Incomplete)
Mr. Jeffery [name], coole are scheduled for a virtual visit with your provider today.    Just as we do with appointments in the office, we must obtain your consent to participate.  Your consent will be active for this visit and any virtual visit you may have with one of our providers in the next 365 days.    If you have a MyChart account, I can also send a copy of this consent to you electronically.  All virtual visits are billed to your insurance company just like a traditional visit in the office.  As this is a virtual visit, video technology does not allow for your provider to perform a traditional examination.  This may limit your provider's ability to fully assess your condition.  If your provider identifies any concerns that need to be evaluated in person or the need to arrange testing such as labs, EKG, etc, we will make arrangements to do so.    Although advances in technology are sophisticated, we cannot ensure that it will always work on either your end or our end.  If the connection with a video visit is poor, we may have to switch to a telephone visit.  With either a video or telephone visit, we are not always able to ensure that we have a secure connection.   I need to obtain your verbal consent now.   Are you willing to proceed with your visit today?   Bj Morlock has provided verbal consent on 03/11/2022 for a virtual visit (video or telephone).  This service is provided via telemedicine  No vital signs collected/recorded due to the encounter was a telemedicine visit.   Location of patient (ex: home, work):  home  Patient consents to a telephone visit:  yes  Location of the provider (ex: office, home):  [facility]   Name of any referring provider:  Durenda Age, NP   Names of all persons participating in the telemedicine service and their role in the encounter:  patient, spouse, [facility] Wynelle Cleveland, NP  Time  spent on call:  5 min   Debe Coder, [place] 03/11/2022  4:49 PM    DATE:  03/11/2022 MRN:  [id number]  BIRTHDAY: 21-Sep-1935   Contact Information     Name Relation Home Work Mobile   Chamberlain Spouse [phone]  [phone]   Huslia Daughter [phone]  [phone]   Keir,Dianna Daughter   [phone]        Code Status History     Date Active Date Inactive Code Status Order ID Comments User Context   01/05/2022 1554 01/07/2022 1951 DNR [id number]  Oswald Hillock, MD Inpatient   12/30/2021 2118 01/05/2022 1554 Full Code [id number]  Rhetta Mura, DO Inpatient   04/04/2020 1333 04/05/2020 1805 Full Code [id number]  Gaynelle Arabian, MD Inpatient   05/29/2016 1852 05/30/2016 1452 Full Code [id number]  Jenetta Loges, PA-C Inpatient    Questions for Most Recent Historical Code Status (Order [id number])     Question Answer   In the event of cardiac or respiratory ARREST Do not call a "code blue"   In the event of cardiac or respiratory ARREST Do not perform Intubation, CPR, defibrillation or ACLS   In the event of cardiac or respiratory ARREST Use medication by any route, position, wound care, and other measures to relive pain and suffering. May use oxygen, suction and manual treatment of airway obstruction as needed for comfort.  Chief Complaint  Patient presents with  . Acute Visit    Patient is here for covid positive infection    HISTORY OF PRESENT ILLNESS: This is an 86 year old who tested positive for COVID-19 home test 2 days ago. He has  body aches runny nose, productive cough, with  whitish phlegm. O2 sat on room air were 94-96%. He has PMH of dementia,depression, CAD s/p PCI with stent placement in February 2021, hypertension, restrictive lung disease and BPH.    PAST MEDICAL HISTORY:  Past Medical History:  Diagnosis Date  . Anxiety and depression   . BPH (benign prostatic hyperplasia)   . Coronary artery disease   . Fecal  incontinence   . GERD (gastroesophageal reflux disease)   . Glaucoma   . Hard of hearing   . Headache   . History of pneumonia   . Hyperlipidemia   . Hypertension   . Memory impairment   . Migraine headache   . Neck fracture ([place])    2-3/2-21 followed by DR Ashok Croon in [place]   . Nocturia   . Osteoarthritis   . Restrictive lung disease   . Sleep apnea    wears CPAP does not know setting   . Thyroid disease    Hyperparathyroidism   . Urinary frequency   . Urinary incontinence   . Wears glasses      CURRENT MEDICATIONS: Reviewed  Patient's Medications  New Prescriptions   MOLNUPIRAVIR EUA (LAGEVRIO) 200 MG CAPS CAPSULE    Take 4 capsules (800 mg total) by mouth 2 (two) times daily for 5 days.  Previous Medications   ACETAMINOPHEN (TYLENOL) 500 MG TABLET    Take 1,000 mg by mouth 2 (two) times daily.   AMLODIPINE (NORVASC) 5 MG TABLET    Take 1 tablet (5 mg total) by mouth daily.   ASPIRIN 81 MG TABLET    Take 81 mg by mouth at bedtime.   CHOLECALCIFEROL (VITAMIN D) 2000 UNITS CAPS    Take 2,000 Units by mouth every evening.   COLESTIPOL (COLESTID) 1 G TABLET    Take 1 tablet (1 g total) by mouth 2 (two) times daily.   FLUTICASONE FUROATE 200 MCG/ACT AEPB    Inhale 1 puff into the lungs daily at 12 noon.   IPRATROPIUM-ALBUTEROL (DUONEB) 0.5-2.5 (3) MG/3ML SOLN    Take 3 mLs by nebulization every 6 (six) hours as needed.   LATANOPROST (XALATAN) 0.005 % OPHTHALMIC SOLUTION    Place 1 drop into both eyes at bedtime.   LIDOCAINE EX    Place 1 patch onto the skin daily as needed (back pain).   MAGNESIUM OXIDE (MAG-OX) 400 MG TABLET    Take 1 tablet (400 mg total) by mouth every morning.   METOPROLOL SUCCINATE ER PO    Take 3 mg by mouth daily.   NITROGLYCERIN (MINITRAN) 0.2 MG/HR PATCH    Place 1 patch (0.2 mg total) onto the skin daily. Use I/4 patch q 24 hrs   OMEPRAZOLE (PRILOSEC) 20 MG CAPSULE    Take 20 mg by mouth 2 (two) times daily before a meal.   QUETIAPINE (SEROQUEL)  25 MG TABLET    Take 1 tablet (25 mg total) by mouth at bedtime.   RESPIRATORY THERAPY SUPPLIES (NEBULIZER/TUBING/MOUTHPIECE) KIT    Use with Nebulizer medication every 6 hours as needed Dx:J98.4   TAMSULOSIN (FLOMAX) 0.4 MG CAPS CAPSULE    TAKE 1 CAPSULE BY MOUTH ONCE DAILY WITH SUPPER   TIMOLOL (TIMOPTIC) 0.5 %  OPHTHALMIC SOLUTION    Place 1 drop into both eyes 2 (two) times daily.   VITAMIN B-12 (CYANOCOBALAMIN) 1000 MCG TABLET    Take 1,000 mcg by mouth every morning.  Modified Medications   No medications on file  Discontinued Medications   No medications on file     No Known Allergies   REVIEW OF SYSTEMS:  GENERAL: no change in appetite, no fatigue, no weight changes, no fever, chills or weakness SKIN: Denies rash, itching, wounds, ulcer sores, or nail abnormality EYES: Denies change in vision, dry eyes, eye pain, itching or discharge EARS: Denies change in hearing, ringing in ears, or earache NOSE: Has nasal congestion  MOUTH and THROAT: Denies oral discomfort, gingival pain or bleeding, pain from teeth or hoarseness   RESPIRATORY: has productive cough, CARDIAC: no chest pain, edema or palpitations GI: no abdominal pain, diarrhea, constipation, heart burn, nausea or vomiting GU: Denies dysuria, frequency, hematuria, incontinence, or discharge MUSCULOSKELETAL: Denies joit pain, muscle pain, back pain, restricted movement, or unusual weakness NEUROLOGICAL: Denies dizziness, syncope, numbness, or headache PSYCHIATRIC: Denies feeling of depression or anxiety. No report of hallucinations, insomnia, paranoia, or agitation     LABS/RADIOLOGY: Labs reviewed: Basic Metabolic Panel: Recent Labs    12/31/21 0235 01/01/22 0212 01/02/22 0233 01/03/22 0146 01/04/22 0144  NA 142   < > 142 139 142  K 3.6   < > 3.8 3.7 4.0  CL 109   < > 110 109 112*  CO2 23   < > 22 20* 20*  GLUCOSE 92   < > 93 98 109*  BUN 17   < > 15 24* 23  CREATININE 1.21   < > 1.16 1.46* 1.17  CALCIUM  9.2   < > 9.0 8.7* 9.0  MG 1.8  --   --  1.9  --    < > = values in this interval not displayed.   Liver Function Tests: Recent Labs    12/30/21 1316 12/31/21 0235 01/02/22 0233  AST 30 28 37  ALT 48* 44 59*  ALKPHOS 58 65 70  BILITOT 1.0 1.2 1.4*  PROT 7.1 6.2* 6.1*  ALBUMIN 4.2 3.3* 3.2*   No results for input(s): "LIPASE", "AMYLASE" in the last 8760 hours. Recent Labs    12/31/21 0235  AMMONIA 12   CBC: Recent Labs    11/14/21 1039 12/30/21 1316 12/31/21 0235 01/01/22 0212 01/03/22 0146  WBC 5.9 6.6 7.1 7.1 9.4  NEUTROABS 3,682 4.7 4.9  --   --   HGB 14.6 14.5 14.8 15.0 15.0  HCT 43.3 43.6 43.9 44.1 45.1  MCV 92.9 94.4 95.0 92.8 95.1  PLT 149 161 151 161 150   A1C: Invalid input(s): "A1C" Lipid Panel: Recent Labs    11/14/21 1039  HDL 30*   Cardiac Enzymes: Recent Labs    12/31/21 0235  CKTOTAL 112   BNP: Invalid input(s): "POCBNP" CBG: Recent Labs    01/05/22 2235  GLUCAP 103*      No results found.  ASSESSMENT/PLAN:       Time spent on non face to face visit:  12 minute  The patient gave consent to this video visit. Explained to the patient the risk and privacy issue that was involved with this video visit.   The patient was advised to call back and ask for an in-person evaluation if the symptoms worsen or if the condition fails to improve.   Durenda Age, NP [employer] [phone]

## 2022-03-17 ENCOUNTER — Telehealth: Payer: Self-pay

## 2022-03-17 NOTE — Telephone Encounter (Signed)
I sent Amy Fargo,NP message on wife's chart and wife will use same recommendations for him as well.

## 2022-03-17 NOTE — Telephone Encounter (Signed)
Patient's wife called and stated that they both are still testing positive for COVID. They have taken all of the medication. Would like to know what to do.

## 2022-04-02 ENCOUNTER — Other Ambulatory Visit: Payer: Self-pay | Admitting: Family Medicine

## 2022-04-02 DIAGNOSIS — R413 Other amnesia: Secondary | ICD-10-CM | POA: Diagnosis not present

## 2022-04-02 DIAGNOSIS — Z8669 Personal history of other diseases of the nervous system and sense organs: Secondary | ICD-10-CM | POA: Diagnosis not present

## 2022-04-02 DIAGNOSIS — Z9181 History of falling: Secondary | ICD-10-CM | POA: Diagnosis not present

## 2022-04-02 DIAGNOSIS — R531 Weakness: Secondary | ICD-10-CM

## 2022-04-02 DIAGNOSIS — R269 Unspecified abnormalities of gait and mobility: Secondary | ICD-10-CM

## 2022-04-02 DIAGNOSIS — R519 Headache, unspecified: Secondary | ICD-10-CM | POA: Diagnosis not present

## 2022-04-02 DIAGNOSIS — Z8781 Personal history of (healed) traumatic fracture: Secondary | ICD-10-CM | POA: Diagnosis not present

## 2022-04-03 ENCOUNTER — Telehealth: Payer: Self-pay | Admitting: *Deleted

## 2022-04-07 ENCOUNTER — Encounter: Payer: Self-pay | Admitting: Adult Health

## 2022-04-07 ENCOUNTER — Ambulatory Visit (INDEPENDENT_AMBULATORY_CARE_PROVIDER_SITE_OTHER): Payer: Medicare HMO | Admitting: Adult Health

## 2022-04-07 VITALS — BP 160/110 | HR 71 | Temp 97.0°F | Resp 18 | Ht 70.0 in | Wt 255.0 lb

## 2022-04-07 DIAGNOSIS — I10 Essential (primary) hypertension: Secondary | ICD-10-CM

## 2022-04-07 DIAGNOSIS — E669 Obesity, unspecified: Secondary | ICD-10-CM | POA: Diagnosis not present

## 2022-04-07 MED ORDER — METOPROLOL SUCCINATE ER 25 MG PO TB24: 25.0000 mg | ORAL_TABLET | Freq: Every day | ORAL | 3 refills | Status: DC

## 2022-04-07 NOTE — Patient Instructions (Signed)
Hypertension, Adult ?Hypertension is another name for high blood pressure. High blood pressure forces your heart to work harder to pump blood. This can cause problems over time. ?There are two numbers in a blood pressure reading. There is a top number (systolic) over a bottom number (diastolic). It is best to have a blood pressure that is below 120/80. ?What are the causes? ?The cause of this condition is not known. Some other conditions can lead to high blood pressure. ?What increases the risk? ?Some lifestyle factors can make you more likely to develop high blood pressure: ?Smoking. ?Not getting enough exercise or physical activity. ?Being overweight. ?Having too much fat, sugar, calories, or salt (sodium) in your diet. ?Drinking too much alcohol. ?Other risk factors include: ?Having any of these conditions: ?Heart disease. ?Diabetes. ?High cholesterol. ?Kidney disease. ?Obstructive sleep apnea. ?Having a family history of high blood pressure and high cholesterol. ?Age. The risk increases with age. ?Stress. ?What are the signs or symptoms? ?High blood pressure may not cause symptoms. Very high blood pressure (hypertensive crisis) may cause: ?Headache. ?Fast or uneven heartbeats (palpitations). ?Shortness of breath. ?Nosebleed. ?Vomiting or feeling like you may vomit (nauseous). ?Changes in how you see. ?Very bad chest pain. ?Feeling dizzy. ?Seizures. ?How is this treated? ?This condition is treated by making healthy lifestyle changes, such as: ?Eating healthy foods. ?Exercising more. ?Drinking less alcohol. ?Your doctor may prescribe medicine if lifestyle changes do not help enough and if: ?Your top number is above 130. ?Your bottom number is above 80. ?Your personal target blood pressure may vary. ?Follow these instructions at home: ?Eating and drinking ? ?If told, follow the DASH eating plan. To follow this plan: ?Fill one half of your plate at each meal with fruits and vegetables. ?Fill one fourth of your plate  at each meal with whole grains. Whole grains include whole-wheat pasta, brown rice, and whole-grain bread. ?Eat or drink low-fat dairy products, such as skim milk or low-fat yogurt. ?Fill one fourth of your plate at each meal with low-fat (lean) proteins. Low-fat proteins include fish, chicken without skin, eggs, beans, and tofu. ?Avoid fatty meat, cured and processed meat, or chicken with skin. ?Avoid pre-made or processed food. ?Limit the amount of salt in your diet to less than 1,500 mg each day. ?Do not drink alcohol if: ?Your doctor tells you not to drink. ?You are pregnant, may be pregnant, or are planning to become pregnant. ?If you drink alcohol: ?Limit how much you have to: ?0-1 drink a day for women. ?0-2 drinks a day for men. ?Know how much alcohol is in your drink. In the [place] one drink equals one 12 oz bottle of beer (355 mL), one 5 oz glass of wine (148 mL), or one 1? oz glass of hard liquor (44 mL). ?Lifestyle ? ?Work with your doctor to stay at a healthy weight or to lose weight. Ask your doctor what the best weight is for you. ?Get at least 30 minutes of exercise that causes your heart to beat faster (aerobic exercise) most days of the week. This may include walking, swimming, or biking. ?Get at least 30 minutes of exercise that strengthens your muscles (resistance exercise) at least 3 days a week. This may include lifting weights or doing Pilates. ?Do not smoke or use any products that contain nicotine or tobacco. If you need help quitting, ask your doctor. ?Check your blood pressure at home as told by your doctor. ?Keep all follow-up visits. ?Medicines ?Take over-the-counter and prescription medicines   only as told by your doctor. Follow directions carefully. ?Do not skip doses of blood pressure medicine. The medicine does not work as well if you skip doses. Skipping doses also puts you at risk for problems. ?Ask your doctor about side effects or reactions to medicines that you should watch  for. ?Contact a doctor if: ?You think you are having a reaction to the medicine you are taking. ?You have headaches that keep coming back. ?You feel dizzy. ?You have swelling in your ankles. ?You have trouble with your vision. ?Get help right away if: ?You get a very bad headache. ?You start to feel mixed up (confused). ?You feel weak or numb. ?You feel faint. ?You have very bad pain in your: ?Chest. ?Belly (abdomen). ?You vomit more than once. ?You have trouble breathing. ?These symptoms may be an emergency. Get help right away. Call 911. ?Do not wait to see if the symptoms will go away. ?Do not drive yourself to the hospital. ?Summary ?Hypertension is another name for high blood pressure. ?High blood pressure forces your heart to work harder to pump blood. ?For most people, a normal blood pressure is less than 120/80. ?Making healthy choices can help lower blood pressure. If your blood pressure does not get lower with healthy choices, you may need to take medicine. ?This information is not intended to replace advice given to you by your health care provider. Make sure you discuss any questions you have with your health care provider. ?Document Revised: 04/04/2021 Document Reviewed: 04/04/2021 ?Elsevier Patient Education ? 2023 Elsevier Inc. ? ?

## 2022-04-07 NOTE — Progress Notes (Signed)
[facility] clinic  Provider:  Kenard Gower DNP  Code Status: DNR  Goals of Care:     01/04/2022    8:00 AM  Advanced Directives  Does Patient Have a Medical Advance Directive? Yes  Type of Advance Directive Living will;Healthcare Power of Attorney  Does patient want to make changes to medical advance directive? No - Patient declined     Chief Complaint  Patient presents with   Acute Visit    Patient is here for elevated bp     HPI: Patient is a 86 y.o. male seen today for an acute visit for elevated BP. He was accompanied by his wife today.  Wife brought in BP log from home, SBPs ranging from  144 to 172 and HR 59 to 95.He takes Metoprolol succinate 25 mg half tab= 12.5 mg daily and Amlodipine 5 mg daily for hypertension.  Latest weight is 255 lbs, Body mass index is 36.59 kg/m. Wife stated that he naps during the day a lot.  Past Medical History:  Diagnosis Date   Anxiety and depression    BPH (benign prostatic hyperplasia)    Coronary artery disease    Fecal incontinence    GERD (gastroesophageal reflux disease)    Glaucoma    Hard of hearing    Headache    History of pneumonia    Hyperlipidemia    Hypertension    Memory impairment    Migraine headache    Neck fracture ([facility])    2-3/2-21 followed by DR Sandi Carne in [place]    Nocturia    Osteoarthritis    Restrictive lung disease    Sleep apnea    wears CPAP does not know setting    Thyroid disease    Hyperparathyroidism    Urinary frequency    Urinary incontinence    Wears glasses     Past Surgical History:  Procedure Laterality Date   CARDIAC CATHETERIZATION     CIRCUMCISION     COLONOSCOPY     CORONARY ANGIOPLASTY     stents- 08/2019    ESOPHAGOGASTRODUODENOSCOPY     EYE SURGERY Bilateral    cataract   HAND SURGERY Right    HIP ARTHROPLASTY Right    HIP ARTHROPLASTY Left 2006   KNEE CARTILAGE SURGERY Bilateral    LUMBAR LAMINECTOMY     L3 - L4   RETINAL DETACHMENT SURGERY Right    x2    REVERSE SHOULDER ARTHROPLASTY Right 05/29/2016   Procedure: REVERSE SHOULDER ARTHROPLASTY;  Surgeon: Francena Hanly, MD;  Location: [facility] OR;  Service: Orthopedics;  Laterality: Right;   SHOULDER SURGERY Bilateral    TOTAL HIP REVISION Right 04/04/2020   Procedure: Right hip acetabular liner revision;  Surgeon: [name], MD;  Location: [facility] ORS;  Service: Orthopedics;  Laterality: Right;     No Known Allergies  Outpatient Encounter Medications as of 04/07/2022  Medication Sig   acetaminophen (TYLENOL) 500 MG tablet Take 1,000 mg by mouth 2 (two) times daily.   amLODipine (NORVASC) 5 MG tablet Take 1 tablet (5 mg total) by mouth daily.   aspirin 81 MG tablet Take 81 mg by mouth at bedtime.   Cholecalciferol (VITAMIN D) 2000 units CAPS Take 2,000 Units by mouth every evening.   colestipol (COLESTID) 1 g tablet Take 1 tablet (1 g total) by mouth 2 (two) times daily. (Patient taking differently: Take 2 g by mouth See admin instructions. Take 2 tablets (2 g) by mouth twice daily - do not  take within one hour of other medications)   Fluticasone Furoate 200 MCG/ACT AEPB Inhale 1 puff into the lungs daily at 12 noon. (Patient taking differently: Inhale 1 puff into the lungs at bedtime.)   ipratropium-albuterol (DUONEB) 0.5-2.5 (3) MG/3ML SOLN Take 3 mLs by nebulization every 6 (six) hours as needed.   latanoprost (XALATAN) 0.005 % ophthalmic solution Place 1 drop into both eyes at bedtime.   LIDOCAINE EX Place 1 patch onto the skin daily as needed (back pain).   magnesium oxide (MAG-OX) 400 MG tablet Take 1 tablet (400 mg total) by mouth every morning.   metoprolol succinate (TOPROL-XL) 25 MG 24 hr tablet Take 1 tablet (25 mg total) by mouth daily.   nitroGLYCERIN (MINITRAN) 0.2 mg/hr patch Place 1 patch (0.2 mg total) onto the skin daily. Use I/4 patch q 24 hrs   omeprazole (PRILOSEC) 20 MG capsule Take 20 mg by mouth 2 (two) times daily before a meal.   QUEtiapine (SEROQUEL) 25 MG tablet Take  1 tablet (25 mg total) by mouth at bedtime.   Respiratory Therapy Supplies (NEBULIZER/TUBING/MOUTHPIECE) KIT Use with Nebulizer medication every 6 hours as needed Dx:J98.4   tamsulosin (FLOMAX) 0.4 MG CAPS capsule TAKE 1 CAPSULE BY MOUTH ONCE DAILY WITH SUPPER   timolol (TIMOPTIC) 0.5 % ophthalmic solution Place 1 drop into both eyes 2 (two) times daily.   vitamin B-12 (CYANOCOBALAMIN) 1000 MCG tablet Take 1,000 mcg by mouth every morning.   [DISCONTINUED] METOPROLOL SUCCINATE ER PO Take 3 mg by mouth daily.   No facility-administered encounter medications on file as of 04/07/2022.    Review of Systems:  Review of Systems  Constitutional:  Negative for activity change, appetite change and fever.  HENT:  Negative for sore throat.   Eyes: Negative.   Cardiovascular:  Negative for chest pain and leg swelling.  Gastrointestinal:  Negative for abdominal distention, diarrhea and vomiting.  Genitourinary:  Negative for dysuria, frequency and urgency.  Skin:  Negative for color change.  Neurological:  Positive for headaches. Negative for dizziness.       Has chronic headache  Psychiatric/Behavioral:  Negative for behavioral problems and sleep disturbance. The patient is not nervous/anxious.     Health Maintenance  Topic Date Due   COVID-19 Vaccine (5 - Moderna risk series) 04/08/2021   INFLUENZA VACCINE  01/28/2022   TETANUS/TDAP  06/21/2030   Pneumonia Vaccine 64+ Years old  Completed   Zoster Vaccines- Shingrix  Completed   HPV VACCINES  Aged Out    Physical Exam: Vitals:   04/07/22 1339  BP: (!) 160/110  Pulse: 71  Resp: 18  Temp: (!) 97 F (36.1 C)  SpO2: 95%  Weight: 255 lb (115.7 kg)  Height: $Remove'5\' 10"'zhXisCw$  (1.778 m)   Body mass index is 36.59 kg/m. Physical Exam Constitutional:      General: He is not in acute distress.    Appearance: He is obese.  HENT:     Head: Normocephalic and atraumatic.     Mouth/Throat:     Mouth: Mucous membranes are moist.  Eyes:      Conjunctiva/sclera: Conjunctivae normal.  Cardiovascular:     Rate and Rhythm: Normal rate and regular rhythm.     Pulses: Normal pulses.     Heart sounds: Normal heart sounds.  Pulmonary:     Effort: Pulmonary effort is normal.     Breath sounds: Normal breath sounds.  Abdominal:     General: Bowel sounds are normal.  Palpations: Abdomen is soft.  Musculoskeletal:        General: No swelling. Normal range of motion.     Cervical back: Normal range of motion.     Left lower leg: Edema present.     Comments: Left ankle 1+edema  Skin:    General: Skin is warm and dry.  Neurological:     General: No focal deficit present.     Mental Status: He is alert and oriented to person, place, and time.  Psychiatric:        Mood and Affect: Mood normal.        Behavior: Behavior normal.        Thought Content: Thought content normal.        Judgment: Judgment normal.     Labs reviewed: Basic Metabolic Panel: Recent Labs    11/14/21 1039 12/26/21 1138 12/31/21 0235 01/01/22 0212 01/02/22 0233 01/03/22 0146 01/04/22 0144  NA 142   < > 142   < > 142 139 142  K 4.1   < > 3.6   < > 3.8 3.7 4.0  CL 107   < > 109   < > 110 109 112*  CO2 24   < > 23   < > 22 20* 20*  GLUCOSE 86   < > 92   < > 93 98 109*  BUN 22   < > 17   < > 15 24* 23  CREATININE 1.18   < > 1.21   < > 1.16 1.46* 1.17  CALCIUM 9.2   < > 9.2   < > 9.0 8.7* 9.0  MG  --   --  1.8  --   --  1.9  --   TSH 2.00  --  1.991  --   --   --   --    < > = values in this interval not displayed.   Liver Function Tests: Recent Labs    12/30/21 1316 12/31/21 0235 01/02/22 0233  AST 30 28 37  ALT 48* 44 59*  ALKPHOS 58 65 70  BILITOT 1.0 1.2 1.4*  PROT 7.1 6.2* 6.1*  ALBUMIN 4.2 3.3* 3.2*   No results for input(s): "LIPASE", "AMYLASE" in the last 8760 hours. Recent Labs    12/31/21 0235  AMMONIA 12   CBC: Recent Labs    11/14/21 1039 12/30/21 1316 12/31/21 0235 01/01/22 0212 01/03/22 0146  WBC 5.9 6.6 7.1  7.1 9.4  NEUTROABS 3,682 4.7 4.9  --   --   HGB 14.6 14.5 14.8 15.0 15.0  HCT 43.3 43.6 43.9 44.1 45.1  MCV 92.9 94.4 95.0 92.8 95.1  PLT 149 161 151 161 150   Lipid Panel: Recent Labs    11/14/21 1039  CHOL 204*  HDL 30*  LDLCALC 136*  TRIG 250*  CHOLHDL 6.8*   Lab Results  Component Value Date   HGBA1C 5.3 03/11/2021    Procedures since last visit: No results found.  Assessment/Plan  1. Uncontrolled hypertension -  will increase Metoprolol succinate from 12.5 mg daily to 25 mg daily -  continue Amlodipine -  monitor BP/HR daily and log - metoprolol succinate (TOPROL-XL) 25 MG 24 hr tablet; Take 1 tablet (25 mg total) by mouth daily.  Dispense: 90 tablet; Refill: 3  2. Obesity (BMI 30-39.9) Body mass index is 36.59 kg/m. -  instructed to eat plant-based diet and regular exercise -  Encouraged to stay active during the day    Labs/tests  ordered:   None  Next appt:  04/24/2022/ follow up in 1 week

## 2022-04-14 ENCOUNTER — Ambulatory Visit (INDEPENDENT_AMBULATORY_CARE_PROVIDER_SITE_OTHER): Payer: Medicare HMO | Admitting: Adult Health

## 2022-04-14 ENCOUNTER — Encounter: Payer: Self-pay | Admitting: Adult Health

## 2022-04-14 VITALS — BP 140/100 | HR 63 | Temp 97.7°F | Resp 20 | Ht 72.0 in | Wt 253.0 lb

## 2022-04-14 DIAGNOSIS — J984 Other disorders of lung: Secondary | ICD-10-CM | POA: Diagnosis not present

## 2022-04-14 DIAGNOSIS — I10 Essential (primary) hypertension: Secondary | ICD-10-CM

## 2022-04-14 DIAGNOSIS — E669 Obesity, unspecified: Secondary | ICD-10-CM | POA: Diagnosis not present

## 2022-04-14 MED ORDER — CARVEDILOL 25 MG PO TABS: 25.0000 mg | ORAL_TABLET | Freq: Two times a day (BID) | ORAL | 3 refills | Status: DC

## 2022-04-14 NOTE — Progress Notes (Signed)
1  [facility]  Provider:  Durenda Age DNP  Code Status:  DNR  Goals of Care:     01/04/2022    8:00 AM  Advanced Directives  Does Patient Have a Medical Advance Directive? Yes  Type of Advance Directive Living will;Healthcare Power of Attorney  Does patient want to make changes to medical advance directive? No - Patient declined     Chief Complaint  Patient presents with   Acute Visit    Follow up hypertension    HPI: Patient is a 86 y.o. male seen today for an acute visit for hypertension. He is accompanied today by his wife. Today BP 140/80. Wife brought a BP log from home with SBP ranging from 132 to 157. He takes Amlodipine and Toprol XL for hypertension.  He has a restrictive lung disease. He stated that whenever he breathes "I don't have a good breath." O2 sat on room air today is 98%.   Weight 253 lbs, Body mass index is 34.31 kg/m.   Past Medical History:  Diagnosis Date   Anxiety and depression    BPH (benign prostatic hyperplasia)    Coronary artery disease    Fecal incontinence    GERD (gastroesophageal reflux disease)    Glaucoma    Hard of hearing    Headache    History of pneumonia    Hyperlipidemia    Hypertension    Memory impairment    Migraine headache    Neck fracture ([place])    2-3/2-21 [place] by DR Ashok Croon in [place]    Nocturia    Osteoarthritis    Restrictive lung disease    Sleep apnea    wears CPAP does not know setting    Thyroid disease    Hyperparathyroidism    Urinary frequency    Urinary incontinence    Wears glasses     Past Surgical History:  Procedure Laterality Date   CARDIAC CATHETERIZATION     CIRCUMCISION     COLONOSCOPY     CORONARY ANGIOPLASTY     stents- 08/2019    ESOPHAGOGASTRODUODENOSCOPY     EYE SURGERY Bilateral    cataract   HAND SURGERY Right    HIP ARTHROPLASTY Right    HIP ARTHROPLASTY Left 2006   KNEE CARTILAGE SURGERY Bilateral    LUMBAR LAMINECTOMY     L3 - L4   RETINAL  DETACHMENT SURGERY Right    x2   REVERSE SHOULDER ARTHROPLASTY Right 05/29/2016   Procedure: REVERSE SHOULDER ARTHROPLASTY;  Surgeon: Justice Britain, MD;  Location: [facility];  Service: Orthopedics;  Laterality: Right;   SHOULDER SURGERY Bilateral    TOTAL HIP REVISION Right 04/04/2020   Procedure: Right hip acetabular liner revision;  Surgeon: Gaynelle Arabian, MD;  Location: [facility];  Service: Orthopedics;  Laterality: Right;  73mn    No Known Allergies  Outpatient Encounter Medications as of 04/14/2022  Medication Sig   acetaminophen (TYLENOL) 500 MG tablet Take 1,000 mg by mouth 2 (two) times daily.   aspirin 81 MG tablet Take 81 mg by mouth at bedtime.   carvedilol (COREG) 25 MG tablet Take 1 tablet (25 mg total) by mouth 2 (two) times daily with a meal.   Cholecalciferol (VITAMIN D) 2000 units CAPS Take 2,000 Units by mouth every evening.   colestipol (COLESTID) 1 g tablet Take 1 tablet (1 g total) by mouth 2 (two) times daily. (Patient taking differently: Take 2 g by mouth See admin instructions. Take 2 tablets (2  g) by mouth twice daily - do not take within one hour of other medications)   Fluticasone Furoate 200 MCG/ACT AEPB Inhale 1 puff into the lungs daily at 12 noon. (Patient taking differently: Inhale 1 puff into the lungs at bedtime.)   latanoprost (XALATAN) 0.005 % ophthalmic solution Place 1 drop into both eyes at bedtime.   LIDOCAINE EX Place 1 patch onto the skin daily as needed (back pain).   magnesium oxide (MAG-OX) 400 MG tablet Take 1 tablet (400 mg total) by mouth every morning.   nitroGLYCERIN (MINITRAN) 0.2 mg/hr patch Place 1 patch (0.2 mg total) onto the skin daily. Use I/4 patch q 24 hrs   omeprazole (PRILOSEC) 20 MG capsule Take 20 mg by mouth 2 (two) times daily before a meal.   QUEtiapine (SEROQUEL) 25 MG tablet Take 1 tablet (25 mg total) by mouth at bedtime.   Respiratory Therapy Supplies (NEBULIZER/TUBING/MOUTHPIECE) KIT Use with Nebulizer medication every 6 hours  as needed Dx:J98.4   tamsulosin (FLOMAX) 0.4 MG CAPS capsule TAKE 1 CAPSULE BY MOUTH ONCE DAILY WITH SUPPER   timolol (TIMOPTIC) 0.5 % ophthalmic solution Place 1 drop into both eyes 2 (two) times daily.   vitamin B-12 (CYANOCOBALAMIN) 1000 MCG tablet Take 1,000 mcg by mouth every morning.   [DISCONTINUED] amLODipine (NORVASC) 5 MG tablet Take 1 tablet (5 mg total) by mouth daily.   [DISCONTINUED] metoprolol succinate (TOPROL-XL) 25 MG 24 hr tablet Take 1 tablet (25 mg total) by mouth daily.   No facility-administered encounter medications on file as of 04/14/2022.    Review of Systems:  Review of Systems  Constitutional:  Negative for activity change, appetite change and fever.  HENT:  Negative for sore throat.   Eyes: Negative.   Cardiovascular:  Negative for chest pain and leg swelling.  Gastrointestinal:  Negative for abdominal distention, diarrhea and vomiting.  Genitourinary:  Negative for dysuria, frequency and urgency.  Skin:  Negative for color change.  Neurological:  Negative for dizziness and headaches.  Psychiatric/Behavioral:  Negative for behavioral problems and sleep disturbance. The patient is not nervous/anxious.     Health Maintenance  Topic Date Due   COVID-19 Vaccine (5 - Moderna risk series) 04/08/2021   INFLUENZA VACCINE  01/28/2022   TETANUS/TDAP  06/21/2030   Pneumonia Vaccine 75+ Years old  Completed   Zoster Vaccines- Shingrix  Completed   HPV VACCINES  Aged Out    Physical Exam: Vitals:   04/14/22 1402  BP: (!) 140/100  Pulse: 63  Resp: 20  Temp: 97.7 F (36.5 C)  SpO2: 98%  Weight: 253 lb (114.8 kg)  Height: 6' (1.829 m)   Body mass index is 34.31 kg/m. Physical Exam Constitutional:      General: He is not in acute distress.    Appearance: He is obese.  HENT:     Head: Normocephalic and atraumatic.     Mouth/Throat:     Mouth: Mucous membranes are moist.  Eyes:     Conjunctiva/sclera: Conjunctivae normal.  Cardiovascular:     Rate  and Rhythm: Normal rate and regular rhythm.     Pulses: Normal pulses.     Heart sounds: Normal heart sounds.  Pulmonary:     Effort: Pulmonary effort is normal.     Breath sounds: Normal breath sounds.  Abdominal:     General: Bowel sounds are normal.     Palpations: Abdomen is soft.  Musculoskeletal:        General: No swelling. Normal range  of motion.     Cervical back: Normal range of motion.  Skin:    General: Skin is warm and dry.  Neurological:     General: No focal deficit present.     Mental Status: He is alert and oriented to person, place, and time.  Psychiatric:        Mood and Affect: Mood normal.        Behavior: Behavior normal.        Thought Content: Thought content normal.        Judgment: Judgment normal.     Labs reviewed: Basic Metabolic Panel: Recent Labs    11/14/21 1039 12/26/21 1138 12/31/21 0235 01/01/22 0212 01/02/22 0233 01/03/22 0146 01/04/22 0144  NA 142   < > 142   < > 142 139 142  K 4.1   < > 3.6   < > 3.8 3.7 4.0  CL 107   < > 109   < > 110 109 112*  CO2 24   < > 23   < > 22 20* 20*  GLUCOSE 86   < > 92   < > 93 98 109*  BUN 22   < > 17   < > 15 24* 23  CREATININE 1.18   < > 1.21   < > 1.16 1.46* 1.17  CALCIUM 9.2   < > 9.2   < > 9.0 8.7* 9.0  MG  --   --  1.8  --   --  1.9  --   TSH 2.00  --  1.991  --   --   --   --    < > = values in this interval not displayed.   Liver Function Tests: Recent Labs    12/30/21 1316 12/31/21 0235 01/02/22 0233  AST 30 28 37  ALT 48* 44 59*  ALKPHOS 58 65 70  BILITOT 1.0 1.2 1.4*  PROT 7.1 6.2* 6.1*  ALBUMIN 4.2 3.3* 3.2*   No results for input(s): "LIPASE", "AMYLASE" in the last 8760 hours. Recent Labs    12/31/21 0235  AMMONIA 12   CBC: Recent Labs    11/14/21 1039 12/30/21 1316 12/31/21 0235 01/01/22 0212 01/03/22 0146  WBC 5.9 6.6 7.1 7.1 9.4  NEUTROABS 3,682 4.7 4.9  --   --   HGB 14.6 14.5 14.8 15.0 15.0  HCT 43.3 43.6 43.9 44.1 45.1  MCV 92.9 94.4 95.0 92.8 95.1   PLT 149 161 151 161 150   Lipid Panel: Recent Labs    11/14/21 1039  CHOL 204*  HDL 30*  LDLCALC 136*  TRIG 250*  CHOLHDL 6.8*   Lab Results  Component Value Date   HGBA1C 5.3 03/11/2021    Procedures since last visit: No results found.  Assessment/Plan  1. Uncontrolled hypertension -  will discontinue Toprol and start Coreg -   plan is to increase Amlodipine to 10 mg daily if BP is still high in 3 days - carvedilol (COREG) 25 MG tablet; Take 1 tablet (25 mg total) by mouth 2 (two) times daily with a meal.  Dispense: 60 tablet; Refill: 3  2. Restrictive lung disease -  O2 sat 98% on room air -  continue Fluticasone Furoate AEPB  3. Obesity (BMI 30-39.9) Body mass index is 34.31 kg/m. -  counseled   Labs/tests ordered:   None  Next appt:  in 1 week

## 2022-04-14 NOTE — Patient Instructions (Signed)

## 2022-04-17 ENCOUNTER — Other Ambulatory Visit: Payer: Self-pay | Admitting: Adult Health

## 2022-04-17 ENCOUNTER — Telehealth: Payer: Self-pay | Admitting: *Deleted

## 2022-04-17 DIAGNOSIS — I10 Essential (primary) hypertension: Secondary | ICD-10-CM

## 2022-04-17 MED ORDER — AMLODIPINE BESYLATE 10 MG PO TABS: 10.0000 mg | ORAL_TABLET | Freq: Every day | ORAL | 1 refills | Status: DC

## 2022-04-17 NOTE — Telephone Encounter (Signed)
Increased amlodipine from 5 mg daily to 10 mg daily. Pls continue to monitor BP and HR twice a day and log. Call office if BP is still high. Follow up in a month or sooner as needed.

## 2022-04-17 NOTE — Telephone Encounter (Signed)
Patient wife called and stated that she was told to call you today regarding patient's blood pressure.   Stated that last night his blood pressure was 152/125 and this Morning is was 138/87  Stated that it is up and down. No other symptoms noted.   Wanting to know if his blood pressure medication needs to be increased.   Please Advise.

## 2022-04-17 NOTE — Telephone Encounter (Signed)
Patient wife notified and agreed.  

## 2022-04-22 DIAGNOSIS — G4733 Obstructive sleep apnea (adult) (pediatric): Secondary | ICD-10-CM | POA: Diagnosis not present

## 2022-04-22 DIAGNOSIS — R269 Unspecified abnormalities of gait and mobility: Secondary | ICD-10-CM | POA: Diagnosis not present

## 2022-04-22 DIAGNOSIS — E669 Obesity, unspecified: Secondary | ICD-10-CM | POA: Diagnosis not present

## 2022-04-22 DIAGNOSIS — J984 Other disorders of lung: Secondary | ICD-10-CM | POA: Diagnosis not present

## 2022-04-22 DIAGNOSIS — M542 Cervicalgia: Secondary | ICD-10-CM | POA: Diagnosis not present

## 2022-04-22 DIAGNOSIS — R0602 Shortness of breath: Secondary | ICD-10-CM | POA: Diagnosis not present

## 2022-04-22 DIAGNOSIS — R531 Weakness: Secondary | ICD-10-CM | POA: Diagnosis not present

## 2022-04-24 ENCOUNTER — Ambulatory Visit: Payer: Medicare HMO | Admitting: Orthopedic Surgery

## 2022-04-29 NOTE — Telephone Encounter (Signed)
Patient's referral was sent to [facility] in [place] they will contact patient.   [address] [place], [place] [address]   Phone [phone]   Fax [phone]   Release: [phone]  I thought I had made the necessary documentation and clicked on the done button.  Spoke with patient's wife, he saw someone at [facility] in [place] last week for a consultation. Mr. Mcmahill is going back for therapy this week or next. Nothing further needed.

## 2022-05-01 ENCOUNTER — Telehealth (INDEPENDENT_AMBULATORY_CARE_PROVIDER_SITE_OTHER): Payer: PPO | Admitting: Orthopedic Surgery

## 2022-05-01 ENCOUNTER — Encounter: Payer: Self-pay | Admitting: Orthopedic Surgery

## 2022-05-01 DIAGNOSIS — R269 Unspecified abnormalities of gait and mobility: Secondary | ICD-10-CM | POA: Diagnosis not present

## 2022-05-01 DIAGNOSIS — M25473 Effusion, unspecified ankle: Secondary | ICD-10-CM

## 2022-05-01 DIAGNOSIS — R531 Weakness: Secondary | ICD-10-CM | POA: Diagnosis not present

## 2022-05-01 DIAGNOSIS — M542 Cervicalgia: Secondary | ICD-10-CM | POA: Diagnosis not present

## 2022-05-01 DIAGNOSIS — I10 Essential (primary) hypertension: Secondary | ICD-10-CM | POA: Diagnosis not present

## 2022-05-01 MED ORDER — FUROSEMIDE 20 MG PO TABS: 20.0000 mg | ORAL_TABLET | Freq: Every morning | ORAL | 0 refills | Status: AC

## 2022-05-01 NOTE — Patient Instructions (Signed)
Stop amlodipine, if blood pressures > 150/90, you may retry amlodipine 5 mg ( I do not recommend higher dose due to side effect of ankle swelling).   You make take furosemide 20 mg every morning x 2 days to help with ankle edema  Continue low sodium diet  If symptoms do not improve please call office

## 2022-05-01 NOTE — Progress Notes (Signed)
Careteam: Patient Care Team: Wardell Honour, MD as PCP - General (Family Medicine)  Seen by: Windell Moulding, AGNP-C  PLACE OF SERVICE:  [facility] Directive information    No Known Allergies  Chief Complaint  Patient presents with   Acute Visit    Patient's wife is concerned about him gaining weight and think it maybe due to not been on Lasix 20 mg anymore.     HPI: Patient is a 86 y.o. male seen today via telephone encounter due to ankle edema.   10/19 evaluated for uncontrolled blood pressure. Metoprolol discontinued. He was started on carvedilol. Amlodipine increased to 10 mg. Today, he reports increased ankle edema and weight gain of 9 lbs. Blood pressures have improved. Wife is taking bp daily, averaging < 150/90. Denies chest pain, increased sob, headaches and blurred vision.   Review of Systems:  Review of Systems  Constitutional:  Negative for fever.  Respiratory:  Negative for cough, shortness of breath and wheezing.   Cardiovascular:  Positive for leg swelling. Negative for chest pain.  Psychiatric/Behavioral:  Negative for depression. The patient is not nervous/anxious.     Past Medical History:  Diagnosis Date   Anxiety and depression    BPH (benign prostatic hyperplasia)    Coronary artery disease    Fecal incontinence    GERD (gastroesophageal reflux disease)    Glaucoma    Hard of hearing    Headache    History of pneumonia    Hyperlipidemia    Hypertension    Memory impairment    Migraine headache    Neck fracture ([place])    2-3/2-21 followed by DR Ashok Croon in [place]    Nocturia    Osteoarthritis    Restrictive lung disease    Sleep apnea    wears CPAP does not know setting    Thyroid disease    Hyperparathyroidism    Urinary frequency    Urinary incontinence    Wears glasses    Past Surgical History:  Procedure Laterality Date   CARDIAC CATHETERIZATION     CIRCUMCISION     COLONOSCOPY     CORONARY ANGIOPLASTY      stents- 08/2019    ESOPHAGOGASTRODUODENOSCOPY     EYE SURGERY Bilateral    cataract   HAND SURGERY Right    HIP ARTHROPLASTY Right    HIP ARTHROPLASTY Left 2006   KNEE CARTILAGE SURGERY Bilateral    LUMBAR LAMINECTOMY     L3 - L4   RETINAL DETACHMENT SURGERY Right    x2   REVERSE SHOULDER ARTHROPLASTY Right 05/29/2016   Procedure: REVERSE SHOULDER ARTHROPLASTY;  Surgeon: Justice Britain, MD;  Location: [facility];  Service: Orthopedics;  Laterality: Right;   SHOULDER SURGERY Bilateral    TOTAL HIP REVISION Right 04/04/2020   Procedure: Right hip acetabular liner revision;  Surgeon: Gaynelle Arabian, MD;  Location: [facility];  Service: Orthopedics;  Laterality: Right;  21mn   Social History:   reports that he has quit smoking. He has been exposed to tobacco smoke. He has never used smokeless tobacco. He reports current alcohol use. He reports that he does not use drugs.  No family history on file.  Medications: Patient's Medications  New Prescriptions   No medications on file  Previous Medications   ACETAMINOPHEN (TYLENOL) 500 MG TABLET    Take 1,000 mg by mouth 2 (two) times daily.   AMLODIPINE (NORVASC) 10 MG TABLET    Take 1 tablet (10  mg total) by mouth daily.   ASPIRIN 81 MG TABLET    Take 81 mg by mouth at bedtime.   CARVEDILOL (COREG) 25 MG TABLET    Take 1 tablet (25 mg total) by mouth 2 (two) times daily with a meal.   CHOLECALCIFEROL (VITAMIN D) 2000 UNITS CAPS    Take 2,000 Units by mouth every evening.   COLESTIPOL (COLESTID) 1 G TABLET    Take 1 tablet (1 g total) by mouth 2 (two) times daily.   FLUTICASONE FUROATE 200 MCG/ACT AEPB    Inhale 1 puff into the lungs daily at 12 noon.   LATANOPROST (XALATAN) 0.005 % OPHTHALMIC SOLUTION    Place 1 drop into both eyes at bedtime.   LIDOCAINE EX    Place 1 patch onto the skin daily as needed (back pain).   MAGNESIUM OXIDE (MAG-OX) 400 MG TABLET    Take 1 tablet (400 mg total) by mouth every morning.   NITROGLYCERIN (MINITRAN) 0.2 MG/HR  PATCH    Place 1 patch (0.2 mg total) onto the skin daily. Use I/4 patch q 24 hrs   OMEPRAZOLE (PRILOSEC) 20 MG CAPSULE    Take 20 mg by mouth 2 (two) times daily before a meal.   QUETIAPINE (SEROQUEL) 25 MG TABLET    Take 1 tablet (25 mg total) by mouth at bedtime.   RESPIRATORY THERAPY SUPPLIES (NEBULIZER/TUBING/MOUTHPIECE) KIT    Use with Nebulizer medication every 6 hours as needed Dx:J98.4   TAMSULOSIN (FLOMAX) 0.4 MG CAPS CAPSULE    TAKE 1 CAPSULE BY MOUTH ONCE DAILY WITH SUPPER   TIMOLOL (TIMOPTIC) 0.5 % OPHTHALMIC SOLUTION    Place 1 drop into both eyes 2 (two) times daily.   VITAMIN B-12 (CYANOCOBALAMIN) 1000 MCG TABLET    Take 1,000 mcg by mouth every morning.  Modified Medications   No medications on file  Discontinued Medications   No medications on file    Physical Exam:  There were no vitals filed for this visit. There is no height or weight on file to calculate BMI. Wt Readings from Last 3 Encounters:  04/14/22 253 lb (114.8 kg)  04/07/22 255 lb (115.7 kg)  01/23/22 260 lb 9.6 oz (118.2 kg)    Physical Examunable to perform due to telephone encounter  Labs reviewed: Basic Metabolic Panel: Recent Labs    11/14/21 1039 12/26/21 1138 12/31/21 0235 01/01/22 0212 01/02/22 0233 01/03/22 0146 01/04/22 0144  NA 142   < > 142   < > 142 139 142  K 4.1   < > 3.6   < > 3.8 3.7 4.0  CL 107   < > 109   < > 110 109 112*  CO2 24   < > 23   < > 22 20* 20*  GLUCOSE 86   < > 92   < > 93 98 109*  BUN 22   < > 17   < > 15 24* 23  CREATININE 1.18   < > 1.21   < > 1.16 1.46* 1.17  CALCIUM 9.2   < > 9.2   < > 9.0 8.7* 9.0  MG  --   --  1.8  --   --  1.9  --   TSH 2.00  --  1.991  --   --   --   --    < > = values in this interval not displayed.   Liver Function Tests: Recent Labs    12/30/21 1316 12/31/21 0235  01/02/22 0233  AST 30 28 37  ALT 48* 44 59*  ALKPHOS 58 65 70  BILITOT 1.0 1.2 1.4*  PROT 7.1 6.2* 6.1*  ALBUMIN 4.2 3.3* 3.2*   No results for input(s):  "LIPASE", "AMYLASE" in the last 8760 hours. Recent Labs    12/31/21 0235  AMMONIA 12   CBC: Recent Labs    11/14/21 1039 12/30/21 1316 12/31/21 0235 01/01/22 0212 01/03/22 0146  WBC 5.9 6.6 7.1 7.1 9.4  NEUTROABS 3,682 4.7 4.9  --   --   HGB 14.6 14.5 14.8 15.0 15.0  HCT 43.3 43.6 43.9 44.1 45.1  MCV 92.9 94.4 95.0 92.8 95.1  PLT 149 161 151 161 150   Lipid Panel: Recent Labs    11/14/21 1039  CHOL 204*  HDL 30*  LDLCALC 136*  TRIG 250*  CHOLHDL 6.8*   TSH: Recent Labs    11/14/21 1039 12/31/21 0235  TSH 2.00 1.991   A1C: Lab Results  Component Value Date   HGBA1C 5.3 03/11/2021     Assessment/Plan 1. Ankle edema - increased edema since amlodipine increased to 10 mg - advised to stop amlodipine 10 mg daily - will give furosemide 20 mg x 2 days - furosemide (LASIX) 20 MG tablet; Take 1 tablet (20 mg total) by mouth in the morning for 2 days.  Dispense: 7 tablet; Refill: 0  2. Uncontrolled hypertension - see above - improved since starting carvedilol - if bp increases > 150/90 may try amlodipine 5 mg daily- tolerated dosage for years - cont low sodium diet  Telephone Note  I connected with Daleen Snook by telephone and verified that I am speaking with the correct person using two identifiers.  Location: [facility] Patient: Jeffery [name] Provider:Edel Rivero Adria Dill, NP    I discussed the limitations, risks, security and privacy concerns of performing an evaluation and management service by telephone and the availability of in person appointments. I also discussed with the patient that there may be a patient responsible charge related to this service. The patient expressed understanding and agreed to proceed.   I discussed the assessment and treatment plan with the patient. The patient was provided an opportunity to ask questions and all were answered. The patient agreed with the plan and demonstrated an understanding of the instructions.   The  patient was advised to call back or seek an in-person evaluation if the symptoms worsen or if the condition fails to improve as anticipated.  I provided 8 minutes of non-face-to-face time during this encounter.  Damaris Abeln Cleophas Dunker, [place] printed and mailed    Next appt: Visit date not found  [facility] [phone]

## 2022-05-03 ENCOUNTER — Other Ambulatory Visit: Payer: Self-pay | Admitting: Family Medicine

## 2022-05-03 DIAGNOSIS — I10 Essential (primary) hypertension: Secondary | ICD-10-CM

## 2022-05-06 DIAGNOSIS — R531 Weakness: Secondary | ICD-10-CM | POA: Diagnosis not present

## 2022-05-06 DIAGNOSIS — M542 Cervicalgia: Secondary | ICD-10-CM | POA: Diagnosis not present

## 2022-05-06 DIAGNOSIS — R269 Unspecified abnormalities of gait and mobility: Secondary | ICD-10-CM | POA: Diagnosis not present

## 2022-05-07 ENCOUNTER — Ambulatory Visit: Payer: Medicare HMO | Admitting: Family

## 2022-05-13 DIAGNOSIS — R531 Weakness: Secondary | ICD-10-CM | POA: Diagnosis not present

## 2022-05-13 DIAGNOSIS — M542 Cervicalgia: Secondary | ICD-10-CM | POA: Diagnosis not present

## 2022-05-13 DIAGNOSIS — R269 Unspecified abnormalities of gait and mobility: Secondary | ICD-10-CM | POA: Diagnosis not present

## 2022-05-15 DIAGNOSIS — R531 Weakness: Secondary | ICD-10-CM | POA: Diagnosis not present

## 2022-05-15 DIAGNOSIS — M542 Cervicalgia: Secondary | ICD-10-CM | POA: Diagnosis not present

## 2022-05-15 DIAGNOSIS — R269 Unspecified abnormalities of gait and mobility: Secondary | ICD-10-CM | POA: Diagnosis not present

## 2022-05-26 ENCOUNTER — Telehealth: Payer: Self-pay

## 2022-05-26 NOTE — Telephone Encounter (Signed)
Message left on clinical intake voicemail:   Patients spouse called on his behalf to express concerns of sores on bottom x 1 week making patient feel miserable. Patient has tried several OTC remedies with no success.   I returned call and offered to make patient an appointment. Mrs.Klute recently had surgery and is unable to drive patient to an appointment, the only option would be a video visit.  Mrs.Leitzke plans to take pictures of area of concern, have her daughter upload to patients mychart account prior to appointment.  Message will be sent to Dinah as a FYI as patient was placed on her schedule.

## 2022-05-26 NOTE — Telephone Encounter (Signed)
Noted  

## 2022-05-27 ENCOUNTER — Telehealth (INDEPENDENT_AMBULATORY_CARE_PROVIDER_SITE_OTHER): Payer: PPO | Admitting: Family

## 2022-05-27 ENCOUNTER — Encounter: Payer: Self-pay | Admitting: Family

## 2022-05-27 DIAGNOSIS — M545 Low back pain, unspecified: Secondary | ICD-10-CM | POA: Diagnosis not present

## 2022-05-27 DIAGNOSIS — L988 Other specified disorders of the skin and subcutaneous tissue: Secondary | ICD-10-CM

## 2022-05-27 MED ORDER — ZINC OXIDE 40 % EX PSTE: 1.0000 | PASTE | Freq: Two times a day (BID) | CUTANEOUS | 3 refills | Status: DC | PRN

## 2022-05-27 NOTE — Progress Notes (Signed)
This service is provided via telemedicine  No vital signs collected/recorded due to the encounter was a telemedicine visit.   Location of patient (ex: home, work):  Home  Patient consents to a telephone visit:  Yes  Location of the provider (ex: office, home):  [employer].  Name of any referring provider:  Wardell Honour, MD   Names of all persons participating in the telemedicine service and their role in the encounter:  Patient, Wife Mrs.Bellina, Heriberto [place], RMA, [place], [place], NP.    Time spent on call: 8 minutes spent on the phone with Medical Assistant.      Provider: Luisana Lutzke FNP-C  Wardell Honour, MD  Patient Care Team: Wardell Honour, MD as PCP - General [facility])  Extended Emergency Contact Information Primary Emergency Contact: [facility] Address: [address] [place], [place] [address] [name] of [place] Phone: [phone] Mobile Phone: [phone] Relation: Spouse Secondary Emergency Contact: [name] Address: [address]          [place], [place] [address] [name] of [place] Phone: [phone] Mobile Phone: [phone] Relation: Daughter  Code Status: Full Code  Goals of care: Advanced Directive information    05/27/2022    2:43 PM  Advanced Directives  Does Patient Have a Medical Advance Directive? Yes  Type of Paramedic of [place];Living will;Out of facility DNR (pink MOST or yellow form)  Does patient want to make changes to medical advance directive? No - Patient declined  Copy of [place] in Chart? Yes - validated most recent copy scanned in chart (See row information)     Chief Complaint  Patient presents with   Acute Visit    Patient wife complains of patient having sores on buttocks.     HPI:  Pt is a 86 y.o. male seen today for an acute visit for evaluation of sores on the  buttocks for couple of weeks which have worsen.wife provides HPI information.states patient has been sleeping on the chair except slept in the bed last night with support of a pillow under his chest. No incontinence reported.patient's daughter took pictures and send via Mychart indicating bilateral gluteal maceration with whitish ointment coating.  Also denies any fever or chills.  Patient also complains of right kidney area pain for few days.denies any fever,chills,nausea,vomiting,abdominal pain,flank pain,urgency,frequency,dysuria,difficult urination or hematuria.Recommend obtaining urine specimen to rule out UTI but wife states unable to drive since she just had recent hand surgery.she will inquire from her surgeon if she can drive by the end of the week then bring patient to give urine specimen at the office.    Past Medical History:  Diagnosis Date   Anxiety and depression    BPH (benign prostatic hyperplasia)    Coronary artery disease    Fecal incontinence    GERD (gastroesophageal reflux disease)    Glaucoma    Hard of hearing    Headache    History of pneumonia    Hyperlipidemia    Hypertension    Memory impairment    Migraine headache    Neck fracture ([place])    2-3/2-21 followed by DR Ashok Croon in [place]    Nocturia    Osteoarthritis    Restrictive lung disease    Sleep apnea    wears CPAP does not know setting    Thyroid disease    Hyperparathyroidism  Urinary frequency    Urinary incontinence    Wears glasses    Past Surgical History:  Procedure Laterality Date   CARDIAC CATHETERIZATION     CIRCUMCISION     COLONOSCOPY     CORONARY ANGIOPLASTY     stents- 08/2019    ESOPHAGOGASTRODUODENOSCOPY     EYE SURGERY Bilateral    cataract   HAND SURGERY Right    HIP ARTHROPLASTY Right    HIP ARTHROPLASTY Left 2006   KNEE CARTILAGE SURGERY Bilateral    LUMBAR LAMINECTOMY     L3 - L4   RETINAL DETACHMENT SURGERY Right    x2   REVERSE SHOULDER ARTHROPLASTY Right  05/29/2016   Procedure: REVERSE SHOULDER ARTHROPLASTY;  Surgeon: Justice Britain, MD;  Location: [facility];  Service: Orthopedics;  Laterality: Right;   SHOULDER SURGERY Bilateral    TOTAL HIP REVISION Right 04/04/2020   Procedure: Right hip acetabular liner revision;  Surgeon: Gaynelle Arabian, MD;  Location: [facility];  Service: Orthopedics;  Laterality: Right;  93mn    No Known Allergies  Outpatient Encounter Medications as of 05/27/2022  Medication Sig   acetaminophen (TYLENOL) 500 MG tablet Take 1,000 mg by mouth 2 (two) times daily.   amLODipine (NORVASC) 10 MG tablet Take 1 tablet (10 mg total) by mouth daily. Needs an appointment before anymore future refills.   aspirin 81 MG tablet Take 81 mg by mouth at bedtime.   carvedilol (COREG) 25 MG tablet Take 1 tablet (25 mg total) by mouth 2 (two) times daily with a meal.   Cholecalciferol (VITAMIN D) 2000 units CAPS Take 2,000 Units by mouth every evening.   colestipol (COLESTID) 1 g tablet Take 1 tablet (1 g total) by mouth 2 (two) times daily.   Fluticasone Furoate 200 MCG/ACT AEPB Inhale 1 puff into the lungs daily at 12 noon.   furosemide (LASIX) 20 MG tablet Take 1 tablet (20 mg total) by mouth in the morning for 2 days.   latanoprost (XALATAN) 0.005 % ophthalmic solution Place 1 drop into both eyes at bedtime.   LIDOCAINE EX Place 1 patch onto the skin daily as needed (back pain).   magnesium oxide (MAG-OX) 400 MG tablet Take 1 tablet (400 mg total) by mouth every morning.   omeprazole (PRILOSEC) 20 MG capsule Take 20 mg by mouth 2 (two) times daily before a meal.   QUEtiapine (SEROQUEL) 25 MG tablet Take 1 tablet (25 mg total) by mouth at bedtime.   Respiratory Therapy Supplies (NEBULIZER/TUBING/MOUTHPIECE) KIT Use with Nebulizer medication every 6 hours as needed Dx:J98.4   tamsulosin (FLOMAX) 0.4 MG CAPS capsule TAKE 1 CAPSULE BY MOUTH ONCE DAILY WITH SUPPER   timolol (TIMOPTIC) 0.5 % ophthalmic solution Place 1 drop into both eyes 2 (two)  times daily.   vitamin B-12 (CYANOCOBALAMIN) 1000 MCG tablet Take 1,000 mcg by mouth every morning.   [DISCONTINUED] nitroGLYCERIN (MINITRAN) 0.2 mg/hr patch Place 1 patch (0.2 mg total) onto the skin daily. Use I/4 patch q 24 hrs   No facility-administered encounter medications on file as of 05/27/2022.    Review of Systems  Constitutional:  Negative for chills.  Skin:        Bilateral gluteal maceration     Immunization History  Administered Date(s) Administered   Influenza Split 04/30/2002, 04/12/2003, 04/05/2004, 04/30/2005, 04/21/2006, 05/03/2007, 03/30/2008, 03/30/2009, 03/27/2010, 05/07/2011, 03/30/2012, 02/28/2013, 05/31/2014, 04/13/2016, 05/13/2016, 04/24/2017, 04/19/2018   Influenza, High Dose Seasonal PF 03/05/2015, 03/05/2015, 05/13/2016, 04/24/2017, 04/27/2018, 03/23/2019   Influenza, Seasonal, Injecte, Preservative  Fre 04/19/2018   Influenza-Unspecified 04/30/2002, 04/12/2003, 04/05/2004, 04/30/2005, 04/21/2006, 05/03/2007, 03/30/2008, 03/30/2009, 03/27/2010, 05/07/2011, 03/30/2012, 02/28/2013, 05/31/2014, 04/13/2016, 05/13/2016, 04/24/2017, 04/19/2018, 03/31/2019, 04/19/2020   Moderna Sars-Covid-2 Vaccination 07/13/2019, 08/10/2019, 04/27/2020, 02/11/2021   Pneumococcal Conjugate-13 04/12/2003, 10/18/2010, 01/22/2012, 12/22/2013   Pneumococcal Polysaccharide-23 11/22/2004   Pneumococcal-Unspecified 04/12/2003, 01/22/2012   Td 02/28/2004   Td (Adult), 2 Lf Tetanus Toxid, Preservative Free 02/28/2004   Td (Adult),unspecified 02/28/2004   Tdap 02/28/2004, 08/31/2014, 09/08/2016, 06/21/2020   Zoster Recombinat (Shingrix) 12/27/2018, 05/11/2019   Zoster, Live 07/30/2011   Pertinent  Health Maintenance Due  Topic Date Due   INFLUENZA VACCINE  01/28/2022      01/06/2022    8:00 AM 01/06/2022    8:00 PM 01/07/2022   10:00 AM 01/23/2022   10:36 AM 05/27/2022    2:42 PM  Fall Risk  Falls in the past year?    1 0  Was there an injury with Fall?    1 0  Fall Risk Category  Calculator    3 0  Fall Risk Category    High Low  Patient Fall Risk Level High fall risk High fall risk High fall risk Moderate fall risk Low fall risk  Patient at Risk for Falls Due to    History of fall(s) No Fall Risks  Fall risk Follow up     Falls evaluation completed   Functional Status Survey:    There were no vitals filed for this visit. There is no height or weight on file to calculate BMI. Physical Exam Constitutional:      General: He is not in acute distress.    Appearance: He is not ill-appearing.  Skin:    Comments: Pictures send on Mychart shows bilateral gluteal skin maceration without any erythema or drainage.   Neurological:     Mental Status: He is alert. Mental status is at baseline.     Gait: Gait abnormal.  Psychiatric:        Mood and Affect: Mood normal.        Behavior: Behavior normal.    Labs reviewed: Recent Labs    12/31/21 0235 01/01/22 0212 01/02/22 0233 01/03/22 0146 01/04/22 0144  NA 142   < > 142 139 142  K 3.6   < > 3.8 3.7 4.0  CL 109   < > 110 109 112*  CO2 23   < > 22 20* 20*  GLUCOSE 92   < > 93 98 109*  BUN 17   < > 15 24* 23  CREATININE 1.21   < > 1.16 1.46* 1.17  CALCIUM 9.2   < > 9.0 8.7* 9.0  MG 1.8  --   --  1.9  --    < > = values in this interval not displayed.   Recent Labs    12/30/21 1316 12/31/21 0235 01/02/22 0233  AST 30 28 37  ALT 48* 44 59*  ALKPHOS 58 65 70  BILITOT 1.0 1.2 1.4*  PROT 7.1 6.2* 6.1*  ALBUMIN 4.2 3.3* 3.2*   Recent Labs    11/14/21 1039 12/30/21 1316 12/31/21 0235 01/01/22 0212 01/03/22 0146  WBC 5.9 6.6 7.1 7.1 9.4  NEUTROABS 3,682 4.7 4.9  --   --   HGB 14.6 14.5 14.8 15.0 15.0  HCT 43.3 43.6 43.9 44.1 45.1  MCV 92.9 94.4 95.0 92.8 95.1  PLT 149 161 151 161 150   Lab Results  Component Value Date   TSH 1.991 12/31/2021   Lab Results  Component Value Date   HGBA1C 5.3 03/11/2021   Lab Results  Component Value Date   CHOL 204 (H) 11/14/2021   HDL 30 (L) 11/14/2021    LDLCALC 136 (H) 11/14/2021   TRIG 250 (H) 11/14/2021   CHOLHDL 6.8 (H) 11/14/2021    Significant Diagnostic Results in last 30 days:  No results found.  Assessment/Plan 1. Maceration of skin Pictures send on Mychart shows bilateral gluteal skin maceration without any erythema or drainage.   - advised to cleanse open areas with soap and warm water,pat dry then coat area with Zinc Oxide then apply mepilex foam dressing for absorption and extra protection. Change dressing every 3 days and as needed if soiled.Avoid rubbing area or wiping hard.  - Advised to avoid prolong sitting and lie in bed from side to side to prevent further skin break down.   - Zinc Oxide 40 % PSTE; Apply 1 Application topically 2 (two) times daily as needed.  Dispense: 113 g; Refill: 3 - extra strength tylenol as needed for pain  2. Acute right-sided low back pain without sciatica New onset recommended collecting urine specimen to rule out UTI.wife unable to drive due to recent hand surgery will inquire with her surgeon if she can drive by end of the week then will bring patient to give urine specimen for U/A and C/S.   Family/ staff Communication: Reviewed plan of care with patient and wife verbalized understanding   Labs/tests ordered: None   Next Appointment: Return if symptoms worsen or fail to improve.  I connected with  Daleen Snook on 05/27/22 by a video enabled telemedicine application and verified that I am speaking with the correct person using two identifiers.   I discussed the limitations of evaluation and management by telemedicine. The patient expressed understanding and agreed to proceed.   Spent 13 minutes of non-face to face with patient  >50% time spent counseling; reviewing medical record and developing future plan of care.   Sandrea Hughs, NP

## 2022-05-27 NOTE — Patient Instructions (Signed)
-   cleanse open areas with soap and warm water,pat dry then coat area with Zinc Oxide then apply mepilex foam dressing for absorption and extra protection. Change dressing every 3 days and as needed if soiled.Avoid rubbing area or wiping hard.  - Avoid prolong sitting and lie in bed from side to side to prevent further skin break down.   - Zinc Oxide 40 % PSTE; Apply 1 Application topically 2 (two) times daily as needed.  Dispense: 113 g; Refill: 3  - extra strength tylenol as needed for pain

## 2022-06-11 ENCOUNTER — Other Ambulatory Visit: Payer: Self-pay

## 2022-06-11 DIAGNOSIS — Z87891 Personal history of nicotine dependence: Secondary | ICD-10-CM | POA: Diagnosis not present

## 2022-06-11 DIAGNOSIS — J984 Other disorders of lung: Secondary | ICD-10-CM | POA: Diagnosis not present

## 2022-06-11 DIAGNOSIS — E669 Obesity, unspecified: Secondary | ICD-10-CM | POA: Diagnosis not present

## 2022-06-11 MED ORDER — OMEPRAZOLE 20 MG PO CPDR: 20.0000 mg | DELAYED_RELEASE_CAPSULE | Freq: Two times a day (BID) | ORAL | 1 refills | Status: DC

## 2022-07-03 ENCOUNTER — Encounter: Payer: PPO | Admitting: Family

## 2022-07-03 ENCOUNTER — Encounter: Payer: Self-pay | Admitting: Adult Health

## 2022-07-03 ENCOUNTER — Ambulatory Visit (INDEPENDENT_AMBULATORY_CARE_PROVIDER_SITE_OTHER): Payer: PPO | Admitting: Adult Health

## 2022-07-03 VITALS — BP 124/86 | HR 70 | Temp 98.2°F | Ht 72.0 in | Wt 264.4 lb

## 2022-07-03 DIAGNOSIS — F5101 Primary insomnia: Secondary | ICD-10-CM

## 2022-07-03 DIAGNOSIS — I10 Essential (primary) hypertension: Secondary | ICD-10-CM | POA: Diagnosis not present

## 2022-07-03 DIAGNOSIS — R1011 Right upper quadrant pain: Secondary | ICD-10-CM | POA: Diagnosis not present

## 2022-07-03 DIAGNOSIS — M549 Dorsalgia, unspecified: Secondary | ICD-10-CM | POA: Diagnosis not present

## 2022-07-03 LAB — POCT URINALYSIS DIPSTICK
Bilirubin, UA: NEGATIVE
Blood, UA: NEGATIVE
Glucose, UA: NEGATIVE
Ketones, UA: NEGATIVE
Leukocytes, UA: NEGATIVE
Nitrite, UA: NEGATIVE
Protein, UA: POSITIVE — AB
Spec Grav, UA: 1.02 (ref 1.010–1.025)
Urobilinogen, UA: 0.2 E.U./dL
pH, UA: 7 (ref 5.0–8.0)

## 2022-07-03 MED ORDER — PREDNISONE 20 MG PO TABS: ORAL_TABLET | ORAL | 0 refills | Status: AC

## 2022-07-03 MED ORDER — MELATONIN 5 MG PO TABS: 5.0000 mg | ORAL_TABLET | Freq: Every day | ORAL | 0 refills | Status: DC

## 2022-07-03 NOTE — Patient Instructions (Signed)
Chronic Back Pain When back pain lasts longer than 3 months, it is called chronic back pain. Pain may get worse at certain times (flare-ups). There are things you can do at home to manage your pain. Follow these instructions at home: Pay attention to any changes in your symptoms. Take these actions to help with your pain: Managing pain and stiffness     If told, put ice on the painful area. Your doctor may tell you to use ice for 24-48 hours after the flare-up starts. To do this: Put ice in a plastic bag. Place a towel between your skin and the bag. Leave the ice on for 20 minutes, 2-3 times a day. If told, put heat on the painful area. Do this as often as told by your doctor. Use the heat source that your doctor recommends, such as a moist heat pack or a heating pad. Place a towel between your skin and the heat source. Leave the heat on for 20-30 minutes. Take off the heat if your skin turns bright red. This is especially important if you are unable to feel pain, heat, or cold. You may have a greater risk of getting burned. Soak in a warm bath. This can help relieve pain. Activity  Avoid bending and other activities that make pain worse. When standing: Keep your upper back and neck straight. Keep your shoulders pulled back. Avoid slouching. When sitting: Keep your back straight. Relax your shoulders. Do not round your shoulders or pull them backward. Do not sit or stand in one place for long periods of time. Take short rest breaks during the day. Lying down or standing is usually better than sitting. Resting can help relieve pain. When sitting or lying down for a long time, do some mild activity or stretching. This will help to prevent stiffness and pain. Get regular exercise. Ask your doctor what activities are safe for you. Do not lift anything that is heavier than 10 lb (4.5 kg) or the limit that you are told, until your doctor says that it is safe. To prevent injury when you lift  things: Bend your knees. Keep the weight close to your body. Avoid twisting. Sleep on a firm mattress. Try lying on your side with your knees slightly bent. If you lie on your back, put a pillow under your knees. Medicines Treatment may include medicines for pain and swelling taken by mouth or put on the skin, prescription pain medicine, or muscle relaxants. Take over-the-counter and prescription medicines only as told by your doctor. Ask your doctor if the medicine prescribed to you: Requires you to avoid driving or using machinery. Can cause trouble pooping (constipation). You may need to take these actions to prevent or treat trouble pooping: Drink enough fluid to keep your pee (urine) pale yellow. Take over-the-counter or prescription medicines. Eat foods that are high in fiber. These include beans, whole grains, and fresh fruits and vegetables. Limit foods that are high in fat and sugars. These include fried or sweet foods. General instructions Do not use any products that contain nicotine or tobacco, such as cigarettes, e-cigarettes, and chewing tobacco. If you need help quitting, ask your doctor. Keep all follow-up visits as told by your doctor. This is important. Contact a doctor if: Your pain does not get better with rest or medicine. Your pain gets worse, or you have new pain. You have a high fever. You lose weight very quickly. You have trouble doing your normal activities. Get help right away   if: One or both of your legs or feet feel weak. One or both of your legs or feet lose feeling (have numbness). You have trouble controlling when you poop (have a bowel movement) or pee (urinate). You have bad back pain and: You feel like you may vomit (nauseous), or you vomit. You have pain in your belly (abdomen). You have shortness of breath. You faint. Summary When back pain lasts longer than 3 months, it is called chronic back pain. Pain may get worse at certain times  (flare-ups). Use ice and heat as told by your doctor. Your doctor may tell you to use ice after flare-ups. This information is not intended to replace advice given to you by your health care provider. Make sure you discuss any questions you have with your health care provider. Document Revised: 07/27/2019 Document Reviewed: 07/27/2019 Elsevier Patient Education  2023 [employer]  

## 2022-07-03 NOTE — Progress Notes (Signed)
[facility] clinic  Provider:  Durenda Age DNP  Code Status:  DNR  Goals of Care:     05/27/2022    2:43 PM  Advanced Directives  Does Patient Have a Medical Advance Directive? Yes  Type of Paramedic of [place];Living will;Out of facility DNR (pink MOST or yellow form)  Does patient want to make changes to medical advance directive? No - Patient declined  Copy of [place] in Chart? Yes - validated most recent copy scanned in chart (See row information)     Chief Complaint  Patient presents with   Acute Visit    Patient presents today for right side/ back pain for 2 week. Wife reports taking tylenol and using Biofreeze.     HPI: Patient is a 87 y.o. male seen today for an acute visit for right lower back pains. He was accompanied today by his wife. He usually has back pains and would take Tylenol PRN, Biofreeze gel and Lidocaine patch. Pain is rated as 10/10 and pain is not relieved by Tylenol, Biofreeze and Lidocaine patch for 2 weeks now. No fall incident for a year now. He has a spinal implant for pain per wife. Tramadol and Gabapentin were tried before but didn't help him with the pain. He has problem sleeping and can't find a comfortable position. He wears a CPAP at night. He has used acupuncture before and wife plans to schedule an appointment at the [facility]  He reported having strong urine odor. Urine dipstick was positive for protein. He denies dysuria nor hematuria  H, also, complains of RUQ pain, 8/10, for a week now. Wife reported that he takes brandy 2 oz at least 3X/week.    Past Medical History:  Diagnosis Date   Anxiety and depression    BPH (benign prostatic hyperplasia)    Coronary artery disease    Fecal incontinence    GERD (gastroesophageal reflux disease)    Glaucoma    Hard of hearing    Headache    History of pneumonia    Hyperlipidemia    Hypertension    Memory impairment    Migraine  headache    Neck fracture ([place])    2-3/2-21 [place] DR Ashok Croon in [place]    Nocturia    Osteoarthritis    Restrictive lung disease    Sleep apnea    wears CPAP does not know setting    Thyroid disease    Hyperparathyroidism    Urinary frequency    Urinary incontinence    Wears glasses     Past Surgical History:  Procedure Laterality Date   CARDIAC CATHETERIZATION     CIRCUMCISION     COLONOSCOPY     CORONARY ANGIOPLASTY     stents- 08/2019    ESOPHAGOGASTRODUODENOSCOPY     EYE SURGERY Bilateral    cataract   HAND SURGERY Right    HIP ARTHROPLASTY Right    HIP ARTHROPLASTY Left 2006   KNEE CARTILAGE SURGERY Bilateral    LUMBAR LAMINECTOMY     L3 - L4   RETINAL DETACHMENT SURGERY Right    x2   REVERSE SHOULDER ARTHROPLASTY Right 05/29/2016   Procedure: REVERSE SHOULDER ARTHROPLASTY;  Surgeon: Justice Britain, MD;  Location: [facility];  Service: Orthopedics;  Laterality: Right;   SHOULDER SURGERY Bilateral    TOTAL HIP REVISION Right 04/04/2020   Procedure: Right hip acetabular liner revision;  Surgeon: Gaynelle Arabian, MD;  Location: [facility];  Service:  Orthopedics;  Laterality: Right;  36mn    No Known Allergies  Outpatient Encounter Medications as of 07/03/2022  Medication Sig   acetaminophen (TYLENOL) 500 MG tablet Take 1,000 mg by mouth 2 (two) times daily.   amLODipine (NORVASC) 10 MG tablet Take 1 tablet (10 mg total) by mouth daily. Needs an appointment before anymore future refills.   aspirin 81 MG tablet Take 81 mg by mouth at bedtime.   carvedilol (COREG) 25 MG tablet Take 1 tablet (25 mg total) by mouth 2 (two) times daily with a meal.   Cholecalciferol (VITAMIN D) 2000 units CAPS Take 2,000 Units by mouth every evening.   colestipol (COLESTID) 1 g tablet Take 1 tablet (1 g total) by mouth 2 (two) times daily.   Fluticasone Furoate 200 MCG/ACT AEPB Inhale 1 puff into the lungs daily at 12 noon.   latanoprost (XALATAN) 0.005 % ophthalmic solution Place 1 drop  into both eyes at bedtime.   LIDOCAINE EX Place 1 patch onto the skin daily as needed (back pain).   magnesium oxide (MAG-OX) 400 MG tablet Take 1 tablet (400 mg total) by mouth every morning.   omeprazole (PRILOSEC) 20 MG capsule Take 1 capsule (20 mg total) by mouth 2 (two) times daily before a meal.   QUEtiapine (SEROQUEL) 25 MG tablet Take 1 tablet (25 mg total) by mouth at bedtime.   Respiratory Therapy Supplies (NEBULIZER/TUBING/MOUTHPIECE) KIT Use with Nebulizer medication every 6 hours as needed Dx:J98.4   tamsulosin (FLOMAX) 0.4 MG CAPS capsule TAKE 1 CAPSULE BY MOUTH ONCE DAILY WITH SUPPER   timolol (TIMOPTIC) 0.5 % ophthalmic solution Place 1 drop into both eyes 2 (two) times daily.   vitamin B-12 (CYANOCOBALAMIN) 1000 MCG tablet Take 1,000 mcg by mouth every morning.   furosemide (LASIX) 20 MG tablet Take 1 tablet (20 mg total) by mouth in the morning for 2 days.   [DISCONTINUED] Zinc Oxide 40 % PSTE Apply 1 Application topically 2 (two) times daily as needed.   No facility-administered encounter medications on file as of 07/03/2022.    Review of Systems:  Review of Systems  Constitutional:  Negative for activity change, appetite change and fever.  HENT:  Negative for sore throat.   Eyes: Negative.   Cardiovascular:  Negative for chest pain and leg swelling.  Gastrointestinal:  Positive for abdominal pain. Negative for abdominal distention, diarrhea and vomiting.  Genitourinary:  Negative for dysuria, frequency and urgency.  Musculoskeletal:  Positive for back pain.  Skin:  Negative for color change.  Neurological:  Negative for dizziness and headaches.  Psychiatric/Behavioral:  Positive for sleep disturbance. Negative for behavioral problems. The patient is not nervous/anxious.     Health Maintenance  Topic Date Due   Medicare Annual Wellness (AWV)  02/08/2020   INFLUENZA VACCINE  01/28/2022   COVID-19 Vaccine (6 - 2023-24 season) 06/21/2022   DTaP/Tdap/Td (8 - Td or  Tdap) 06/21/2030   Pneumonia Vaccine 87 Years old  Completed   Zoster Vaccines- Shingrix  Completed   HPV VACCINES  Aged Out    Physical Exam: Vitals:   07/03/22 1103  BP: 124/86  Pulse: 70  Temp: 98.2 F (36.8 C)  SpO2: 97%  Weight: 264 lb 6.4 oz (119.9 kg)  Height: 6' (1.829 m)   Body mass index is 35.86 kg/m. Physical Exam Constitutional:      General: He is not in acute distress.    Appearance: He is obese.  HENT:     Head: Normocephalic and  atraumatic.     Mouth/Throat:     Mouth: Mucous membranes are moist.  Eyes:     Conjunctiva/sclera: Conjunctivae normal.  Cardiovascular:     Rate and Rhythm: Normal rate and regular rhythm.     Pulses: Normal pulses.     Heart sounds: Normal heart sounds.  Pulmonary:     Effort: Pulmonary effort is normal.     Breath sounds: Normal breath sounds.  Abdominal:     General: Bowel sounds are normal.     Palpations: Abdomen is soft.  Musculoskeletal:        General: No swelling. Normal range of motion.     Cervical back: Normal range of motion.  Skin:    General: Skin is warm and dry.  Neurological:     Mental Status: He is alert. Mental status is at baseline.  Psychiatric:        Mood and Affect: Mood normal.        Behavior: Behavior normal.        Thought Content: Thought content normal.        Judgment: Judgment normal.     Labs reviewed: Basic Metabolic Panel: Recent Labs    11/14/21 1039 12/26/21 1138 12/31/21 0235 01/01/22 0212 01/02/22 0233 01/03/22 0146 01/04/22 0144  NA 142   < > 142   < > 142 139 142  K 4.1   < > 3.6   < > 3.8 3.7 4.0  CL 107   < > 109   < > 110 109 112*  CO2 24   < > 23   < > 22 20* 20*  GLUCOSE 86   < > 92   < > 93 98 109*  BUN 22   < > 17   < > 15 24* 23  CREATININE 1.18   < > 1.21   < > 1.16 1.46* 1.17  CALCIUM 9.2   < > 9.2   < > 9.0 8.7* 9.0  MG  --   --  1.8  --   --  1.9  --   TSH 2.00  --  1.991  --   --   --   --    < > = values in this interval not displayed.    Liver Function Tests: Recent Labs    12/30/21 1316 12/31/21 0235 01/02/22 0233  AST 30 28 37  ALT 48* 44 59*  ALKPHOS 58 65 70  BILITOT 1.0 1.2 1.4*  PROT 7.1 6.2* 6.1*  ALBUMIN 4.2 3.3* 3.2*   No results for input(s): "LIPASE", "AMYLASE" in the last 8760 hours. Recent Labs    12/31/21 0235  AMMONIA 12   CBC: Recent Labs    11/14/21 1039 12/30/21 1316 12/31/21 0235 01/01/22 0212 01/03/22 0146  WBC 5.9 6.6 7.1 7.1 9.4  NEUTROABS 3,682 4.7 4.9  --   --   HGB 14.6 14.5 14.8 15.0 15.0  HCT 43.3 43.6 43.9 44.1 45.1  MCV 92.9 94.4 95.0 92.8 95.1  PLT 149 161 151 161 150   Lipid Panel: Recent Labs    11/14/21 1039  CHOL 204*  HDL 30*  LDLCALC 136*  TRIG 250*  CHOLHDL 6.8*   Lab Results  Component Value Date   HGBA1C 5.3 03/11/2021    Procedures since last visit: No results found.  Assessment/Plan  1. Back pain, unspecified back location, unspecified back pain laterality, unspecified chronicity -  will try Prednisone for now -  continue Biofreeze gel and  Lidocaine patch -  follow up in 2 weeks, if pain pain does not resolve, will do some imaging - POCT urinalysis dipstick - Urine Culture - predniSONE (DELTASONE) 20 MG tablet; Take 2 tablets (40 mg total) by mouth daily for 3 days, THEN 1 tablet (20 mg total) daily for 3 days.  Dispense: 9 tablet; Refill: 0  2. Primary hypertension -  BP 124/86, stable -  continue Carvedilol and Amlodipine  3. Primary insomnia - melatonin 5 MG TABS; Take 1 tablet (5 mg total) by mouth at bedtime.  Dispense: 30 tablet; Refill: 0  4. Right upper quadrant abdominal pain -   encouraged to decrease alcohol intake - Hepatic Function Panel - BMP with eGFR([facility]) - CBC With Differential/Platelet    Labs/tests ordered:   CBC, BMP, hepatic function, urine dipstick and urine culture  Next appt:  2 weeks

## 2022-07-04 LAB — BASIC METABOLIC PANEL WITH GFR
BUN: 17 mg/dL (ref 7–25)
CO2: 25 mmol/L (ref 20–32)
Calcium: 9.2 mg/dL (ref 8.6–10.3)
Chloride: 107 mmol/L (ref 98–110)
Creat: 1.14 mg/dL (ref 0.70–1.22)
Glucose, Bld: 103 mg/dL (ref 65–139)
Potassium: 3.7 mmol/L (ref 3.5–5.3)
Sodium: 143 mmol/L (ref 135–146)
eGFR: 63 mL/min/{1.73_m2} (ref >=60)

## 2022-07-04 LAB — CBC WITH DIFFERENTIAL/PLATELET
Absolute Monocytes: 731 cells/uL (ref 200–950)
Basophils Absolute: 60 cells/uL (ref 0–200)
Basophils Relative: 0.7 %
Eosinophils Absolute: 213 cells/uL (ref 15–500)
Eosinophils Relative: 2.5 %
HCT: 44.8 % (ref 38.5–50.0)
Hemoglobin: 15 g/dL (ref 13.2–17.1)
Lymphs Abs: 1131 cells/uL (ref 850–3900)
MCH: 30.4 pg (ref 27.0–33.0)
MCHC: 33.5 g/dL (ref 32.0–36.0)
MCV: 90.9 fL (ref 80.0–100.0)
MPV: 10.9 fL (ref 7.5–12.5)
Monocytes Relative: 8.6 %
Neutro Abs: 6367 cells/uL (ref 1500–7800)
Neutrophils Relative %: 74.9 %
Platelets: 167 10*3/uL (ref 140–400)
RBC: 4.93 10*6/uL (ref 4.20–5.80)
RDW: 13.3 % (ref 11.0–15.0)
Total Lymphocyte: 13.3 %
WBC: 8.5 10*3/uL (ref 3.8–10.8)

## 2022-07-04 LAB — URINE CULTURE
MICRO NUMBER:: 14388649
Result:: NO GROWTH
SPECIMEN QUALITY:: ADEQUATE

## 2022-07-04 LAB — HEPATIC FUNCTION PANEL
AG Ratio: 1.4 (calc) (ref 1.0–2.5)
ALT: 12 U/L (ref 9–46)
AST: 13 U/L (ref 10–35)
Albumin: 3.8 g/dL (ref 3.6–5.1)
Alkaline phosphatase (APISO): 86 U/L (ref 35–144)
Bilirubin, Direct: 0.1 mg/dL (ref 0.0–0.2)
Globulin: 2.7 g/dL (calc) (ref 1.9–3.7)
Indirect Bilirubin: 0.7 mg/dL (calc) (ref 0.2–1.2)
Total Bilirubin: 0.8 mg/dL (ref 0.2–1.2)
Total Protein: 6.5 g/dL (ref 6.1–8.1)

## 2022-07-08 NOTE — Progress Notes (Signed)
-    liver enzymes, electrolytes within normal -  urine culture showed no growth

## 2022-07-10 ENCOUNTER — Telehealth: Payer: Self-pay

## 2022-07-10 NOTE — Telephone Encounter (Signed)
Juliann Pulse OT with [facility] called regarding patient and severe back pain. Patient is having severe back pain that he rates at a 15. The pain limited his mobility.   I had a verbal conversation with Marlowe Sax, NP (covering provider) and she recommended patient go to ED.  I called Juliann Pulse back and she was still with patient and wife. I spoke with patient's wife and she verbalized her understanding and agreed.

## 2022-07-10 NOTE — Telephone Encounter (Signed)
Noted  

## 2022-07-10 NOTE — Telephone Encounter (Signed)
Jeffery [name], wife called and stated that she called for a Ambulance but they had waited for over 30 minutes for it to arrive. In the meantime they spoke with their family and have decided NOT to take patient to the ER.   Patient is doing much better and has had improvement.

## 2022-07-13 NOTE — Progress Notes (Signed)
This encounter was created in error - please disregard. Provider was out of office. 

## 2022-07-17 ENCOUNTER — Ambulatory Visit: Payer: PPO | Admitting: Adult Health

## 2022-07-30 ENCOUNTER — Encounter: Payer: Self-pay | Admitting: Family

## 2022-07-30 ENCOUNTER — Other Ambulatory Visit: Payer: PPO

## 2022-07-30 ENCOUNTER — Telehealth (INDEPENDENT_AMBULATORY_CARE_PROVIDER_SITE_OTHER): Payer: PPO | Admitting: Family

## 2022-07-30 DIAGNOSIS — R5382 Chronic fatigue, unspecified: Secondary | ICD-10-CM

## 2022-07-30 DIAGNOSIS — R17 Unspecified jaundice: Secondary | ICD-10-CM

## 2022-07-30 NOTE — Progress Notes (Signed)
This service is provided via telemedicine  No vital signs collected/recorded due to the encounter was a telemedicine visit.   Location of patient (ex: home, work):  Home  Patient consents to a telephone visit:  Yes  Location of the provider (ex: office, home):  [facility]  Name of any referring provider:  Frederica Kuster, MD   Names of all persons participating in the telemedicine service and their role in the encounter: Patient, Wife Mrs.Bacote, Meda Klinefelter, RMA, [place], [place], NP.    Time spent on call: 8 minutes spent on the phone with Medical Assistant.     Location:      Place of Service:    Provider: Delman Goshorn FNP-C  Frederica Kuster, MD  Patient Care Team: Frederica Kuster, MD as PCP - General [facility] Medicine)  Extended Emergency Contact Information Primary Emergency Contact: [employer] Address: [address]          [place], [place] [address] Darden Amber of [place] Home Phone: [phone] Mobile Phone: [phone] Relation: Spouse Secondary Emergency Contact: Carlos Levering Address: [phone]          [place], [place] [address] Darden Amber of [place] Home Phone: [phone] Mobile Phone: [phone] Relation: Daughter  Code Status: Full Code  Goals of care: Advanced Directive information    07/30/2022    3:31 PM  Advanced Directives  Does Patient Have a Medical Advance Directive? Yes  Type of Estate agent of [place];Living will;Out of facility DNR (pink MOST or yellow form)  Does patient want to make changes to medical advance directive? No - Patient declined  Copy of Healthcare Power of Attorney in Chart? Yes - validated most recent copy scanned in chart (See row information)     Chief Complaint  Patient presents with   Acute Visit    Patient nurse assistant complains of hands/feet turning a yellowish color.     HPI:  Pt is a 87 y.o. male seen today for an  acute visit for evaluation of yellow skin color on the hands and feet.wife provides the HPI information.states a Nurse assistance was helping patient in the morning told her patient's hands and feet were yellow.states patient has no appetite.sleeping in the living room due to pain not able to go bed.  He denies any fever,chills,nausea or vomiting.states has difficulties getting patient in and out of the car/house.  On chart review,had hepatic panel and hemoglobin 07/03/2022 which were normal.  Patient was scheduled for video visit but wife states having a hard time with logging into Mychart requested telephone visit instead.   Past Medical History:  Diagnosis Date   Anxiety and depression    BPH (benign prostatic hyperplasia)    Coronary artery disease    Fecal incontinence    GERD (gastroesophageal reflux disease)    Glaucoma    Hard of hearing    Headache    History of pneumonia    Hyperlipidemia    Hypertension    Memory impairment    Migraine headache    Neck fracture ([facility])    2-3/2-21 followed by DR Sandi Carne in [place]    Nocturia    Osteoarthritis    Restrictive lung disease    Sleep apnea    wears CPAP does not know setting    Thyroid disease    Hyperparathyroidism    Urinary frequency    Urinary incontinence    Wears glasses    Past Surgical History:  Procedure  Laterality Date   CARDIAC CATHETERIZATION     CIRCUMCISION     COLONOSCOPY     CORONARY ANGIOPLASTY     stents- 08/2019    ESOPHAGOGASTRODUODENOSCOPY     EYE SURGERY Bilateral    cataract   HAND SURGERY Right    HIP ARTHROPLASTY Right    HIP ARTHROPLASTY Left 2006   KNEE CARTILAGE SURGERY Bilateral    LUMBAR LAMINECTOMY     L3 - L4   RETINAL DETACHMENT SURGERY Right    x2   REVERSE SHOULDER ARTHROPLASTY Right 05/29/2016   Procedure: REVERSE SHOULDER ARTHROPLASTY;  Surgeon: Justice Britain, MD;  Location: [facility];  Service: Orthopedics;  Laterality: Right;   SHOULDER SURGERY Bilateral    TOTAL HIP  REVISION Right 04/04/2020   Procedure: Right hip acetabular liner revision;  Surgeon: Gaynelle Arabian, MD;  [facility];  Service: Orthopedics;  Laterality: Right;  56min    No Known Allergies  Outpatient Encounter Medications as of 07/30/2022  Medication Sig   acetaminophen (TYLENOL) 500 MG tablet Take 1,000 mg by mouth 2 (two) times daily.   amLODipine (NORVASC) 10 MG tablet Take 1 tablet (10 mg total) by mouth daily. Needs an appointment before anymore future refills.   aspirin 81 MG tablet Take 81 mg by mouth at bedtime.   carvedilol (COREG) 25 MG tablet Take 1 tablet (25 mg total) by mouth 2 (two) times daily with a meal.   Cholecalciferol (VITAMIN D) 2000 units CAPS Take 2,000 Units by mouth every evening.   colestipol (COLESTID) 1 g tablet Take 1 tablet (1 g total) by mouth 2 (two) times daily.   Fluticasone Furoate 200 MCG/ACT AEPB Inhale 1 puff into the lungs daily at 12 noon.   furosemide (LASIX) 20 MG tablet Take 1 tablet (20 mg total) by mouth in the morning for 2 days.   latanoprost (XALATAN) 0.005 % ophthalmic solution Place 1 drop into both eyes at bedtime.   LIDOCAINE EX Place 1 patch onto the skin daily as needed (back pain).   magnesium oxide (MAG-OX) 400 MG tablet Take 1 tablet (400 mg total) by mouth every morning.   melatonin 5 MG TABS Take 1 tablet (5 mg total) by mouth at bedtime.   omeprazole (PRILOSEC) 20 MG capsule Take 1 capsule (20 mg total) by mouth 2 (two) times daily before a meal.   QUEtiapine (SEROQUEL) 25 MG tablet Take 1 tablet (25 mg total) by mouth at bedtime.   Respiratory Therapy Supplies (NEBULIZER/TUBING/MOUTHPIECE) KIT Use with Nebulizer medication every 6 hours as needed Dx:J98.4   tamsulosin (FLOMAX) 0.4 MG CAPS capsule TAKE 1 CAPSULE BY MOUTH ONCE DAILY WITH SUPPER   timolol (TIMOPTIC) 0.5 % ophthalmic solution Place 1 drop into both eyes 2 (two) times daily.   vitamin B-12 (CYANOCOBALAMIN) 1000 MCG tablet Take 1,000 mcg by mouth every morning.    No facility-administered encounter medications on file as of 07/30/2022.    Review of Systems  Constitutional:  Positive for fatigue. Negative for appetite change, chills, fever and unexpected weight change.  HENT:  Negative for congestion, postnasal drip, rhinorrhea, sinus pressure, sinus pain, sneezing, sore throat and trouble swallowing.   Eyes:  Negative for pain, discharge, redness, itching and visual disturbance.  Respiratory:  Negative for cough, chest tightness, shortness of breath and wheezing.   Cardiovascular:  Negative for chest pain, palpitations and leg swelling.  Gastrointestinal:  Negative for abdominal distention, abdominal pain, constipation, diarrhea, nausea and vomiting.  Skin:  Positive for color  change. Negative for pallor, rash and wound.       Wife reports yellow color on hands and feet  Neurological:  Negative for dizziness, syncope, speech difficulty, weakness, light-headedness, numbness and headaches.    Immunization History  Administered Date(s) Administered   Influenza Split 04/30/2002, 04/12/2003, 04/05/2004, 04/30/2005, 04/21/2006, 05/03/2007, 03/30/2008, 03/30/2009, 03/27/2010, 05/07/2011, 03/30/2012, 02/28/2013, 05/31/2014, 04/13/2016, 05/13/2016, 04/24/2017, 04/19/2018   Influenza, High Dose Seasonal PF 03/05/2015, 03/05/2015, 05/13/2016, 04/24/2017, 04/27/2018, 03/23/2019   Influenza, Seasonal, Injecte, Preservative Fre 04/19/2018   Influenza-Unspecified 04/30/2002, 04/12/2003, 04/05/2004, 04/30/2005, 04/21/2006, 05/03/2007, 03/30/2008, 03/30/2009, 03/27/2010, 05/07/2011, 03/30/2012, 02/28/2013, 05/31/2014, 04/13/2016, 05/13/2016, 04/24/2017, 04/19/2018, 03/31/2019, 04/19/2020   Moderna Sars-Covid-2 Vaccination 07/13/2019, 08/10/2019, 04/27/2020, 02/11/2021   Pneumococcal Conjugate-13 04/12/2003, 10/18/2010, 01/22/2012, 12/22/2013   Pneumococcal Polysaccharide-23 11/22/2004   Pneumococcal-Unspecified 04/12/2003, 01/22/2012   Td 02/28/2004   Td (Adult),  2 Lf Tetanus Toxid, Preservative Free 02/28/2004   Td (Adult),unspecified 02/28/2004   Tdap 02/28/2004, 08/31/2014, 09/08/2016, 06/21/2020   Zoster Recombinat (Shingrix) 12/27/2018, 05/11/2019   Zoster, Live 07/30/2011   Pertinent  Health Maintenance Due  Topic Date Due   INFLUENZA VACCINE  01/28/2022      01/06/2022    8:00 PM 01/07/2022   10:00 AM 01/23/2022   10:36 AM 05/27/2022    2:42 PM 07/30/2022    3:31 PM  Fall Risk  Falls in the past year?   1 0 0  Was there an injury with Fall?   1 0 0  Fall Risk Category Calculator   3 0 0  Fall Risk Category (Retired)   High Low   (RETIRED) Patient Fall Risk Level High fall risk High fall risk Moderate fall risk Low fall risk   Patient at Risk for Falls Due to   History of fall(s) No Fall Risks No Fall Risks  Fall risk Follow up    Falls evaluation completed Falls evaluation completed   Functional Status Survey:    There were no vitals filed for this visit. There is no height or weight on file to calculate BMI. Physical Exam Unable to complete patient's wife unable to log onto Mychart switched to telephone.  Labs reviewed: Recent Labs    12/31/21 0235 01/01/22 0212 01/03/22 0146 01/04/22 0144 07/03/22 1153  NA 142   < > 139 142 143  K 3.6   < > 3.7 4.0 3.7  CL 109   < > 109 112* 107  CO2 23   < > 20* 20* 25  GLUCOSE 92   < > 98 109* 103  BUN 17   < > 24* 23 17  CREATININE 1.21   < > 1.46* 1.17 1.14  CALCIUM 9.2   < > 8.7* 9.0 9.2  MG 1.8  --  1.9  --   --    < > = values in this interval not displayed.   Recent Labs    12/30/21 1316 12/31/21 0235 01/02/22 0233 07/03/22 1153  AST 30 28 37 13  ALT 48* 44 59* 12  ALKPHOS 58 65 70  --   BILITOT 1.0 1.2 1.4* 0.8  PROT 7.1 6.2* 6.1* 6.5  ALBUMIN 4.2 3.3* 3.2*  --    Recent Labs    12/30/21 1316 12/31/21 0235 01/01/22 0212 01/03/22 0146 07/03/22 1153  WBC 6.6 7.1 7.1 9.4 8.5  NEUTROABS 4.7 4.9  --   --  6,367  HGB 14.5 14.8 15.0 15.0 15.0  HCT 43.6 43.9  44.1 45.1 44.8  MCV 94.4 95.0 92.8 95.1 90.9  PLT 161 151 161 150 167   Lab Results  Component Value Date   TSH 1.991 12/31/2021   Lab Results  Component Value Date   HGBA1C 5.3 03/11/2021   Lab Results  Component Value Date   CHOL 204 (H) 11/14/2021   HDL 30 (L) 11/14/2021   LDLCALC 136 (H) 11/14/2021   TRIG 250 (H) 11/14/2021   CHOLHDL 6.8 (H) 11/14/2021    Significant Diagnostic Results in last 30 days:  No results found.  Assessment/Plan 1. Yellow skin Wife reports Nurse assistance noticed yellow skin on hands and feet while giving care today.Unable to visualized during visit since wife was unable to log onto Mychart video Will obtain lab to rule out acute abnormalities and metabolic etiologies.  - COMPLETE METABOLIC PANEL WITH GFR - CBC with Differential/Platelet  2. Chronic fatigue Afebrile Obtain labs in the morning. - COMPLETE METABOLIC PANEL WITH GFR - CBC with Differential/Platelet  Family/ staff Communication: Reviewed plan of care with patient and wife verbalized understanding  Labs/tests ordered:  - COMPLETE METABOLIC PANEL WITH GFR - CBC with Differential/Platelet  Next Appointment:  Return in about 1 day (around 07/31/2022), or if symptoms worsen or fail to improve, for lab draw CBC and CMP .  I connected with  Daleen Snook on 07/30/22 by a Telephone ( wife unable to log onto Mychart video) enabled telemedicine application and verified that I am speaking with the correct person using two identifiers.   I discussed the limitations of evaluation and management by telemedicine. The patient expressed understanding and agreed to proceed.  Spent 11 minutes of non-face to face with patient  >50% time spent counseling; reviewing medical record; tests; labs; and developing future plan of care.  Sandrea Hughs, NP

## 2022-07-31 ENCOUNTER — Other Ambulatory Visit: Payer: PPO

## 2022-07-31 DIAGNOSIS — R17 Unspecified jaundice: Secondary | ICD-10-CM | POA: Diagnosis not present

## 2022-08-01 LAB — COMPLETE METABOLIC PANEL WITH GFR
AG Ratio: 1.4 (calc) (ref 1.0–2.5)
ALT: 22 U/L (ref 9–46)
AST: 24 U/L (ref 10–35)
Albumin: 3.7 g/dL (ref 3.6–5.1)
Alkaline phosphatase (APISO): 95 U/L (ref 35–144)
BUN: 15 mg/dL (ref 7–25)
CO2: 27 mmol/L (ref 20–32)
Calcium: 9.4 mg/dL (ref 8.6–10.3)
Chloride: 105 mmol/L (ref 98–110)
Creat: 1.16 mg/dL (ref 0.70–1.22)
Globulin: 2.6 g/dL (calc) (ref 1.9–3.7)
Glucose, Bld: 67 mg/dL (ref 65–139)
Potassium: 4.4 mmol/L (ref 3.5–5.3)
Sodium: 141 mmol/L (ref 135–146)
Total Bilirubin: 0.7 mg/dL (ref 0.2–1.2)
Total Protein: 6.3 g/dL (ref 6.1–8.1)
eGFR: 62 mL/min/{1.73_m2} (ref >=60)

## 2022-08-01 LAB — CBC WITH DIFFERENTIAL/PLATELET
Absolute Monocytes: 649 cells/uL (ref 200–950)
Basophils Absolute: 38 cells/uL (ref 0–200)
Basophils Relative: 0.6 %
Eosinophils Absolute: 252 cells/uL (ref 15–500)
Eosinophils Relative: 4 %
HCT: 42.6 % (ref 38.5–50.0)
Hemoglobin: 14.8 g/dL (ref 13.2–17.1)
Lymphs Abs: 1229 cells/uL (ref 850–3900)
MCH: 31.1 pg (ref 27.0–33.0)
MCHC: 34.7 g/dL (ref 32.0–36.0)
MCV: 89.5 fL (ref 80.0–100.0)
MPV: 10.5 fL (ref 7.5–12.5)
Monocytes Relative: 10.3 %
Neutro Abs: 4133 cells/uL (ref 1500–7800)
Neutrophils Relative %: 65.6 %
Platelets: 188 10*3/uL (ref 140–400)
RBC: 4.76 10*6/uL (ref 4.20–5.80)
RDW: 13.3 % (ref 11.0–15.0)
Total Lymphocyte: 19.5 %
WBC: 6.3 10*3/uL (ref 3.8–10.8)

## 2022-08-14 DIAGNOSIS — S22009A Unspecified fracture of unspecified thoracic vertebra, initial encounter for closed fracture: Secondary | ICD-10-CM | POA: Diagnosis not present

## 2022-08-17 ENCOUNTER — Other Ambulatory Visit: Payer: Self-pay | Admitting: Family Medicine

## 2022-08-29 ENCOUNTER — Other Ambulatory Visit: Payer: Self-pay | Admitting: Adult Health

## 2022-08-29 DIAGNOSIS — I10 Essential (primary) hypertension: Secondary | ICD-10-CM

## 2022-08-30 ENCOUNTER — Other Ambulatory Visit: Payer: Self-pay | Admitting: Family Medicine

## 2022-09-08 ENCOUNTER — Telehealth: Payer: PPO

## 2022-09-08 NOTE — Telephone Encounter (Signed)
Patient's wife called requesting a referral to another oncologist for a second opinion. 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 PT had suggested that he may benefit from palliative care. She called and they told her that he wasn't disabled enough. She received a call from the NP at the [facility] [place]. And was told that he had cancer several other places besides what she was told. She would like second opinion.  Message routed to Sherrie Mustache, NP (covering provider)

## 2022-09-11 NOTE — Telephone Encounter (Signed)
Patient's wife called back to check on status of request. Informed patient that still waiting on reply from Dr. Alain Honey.

## 2022-09-15 NOTE — Telephone Encounter (Signed)
Reading record, I cannot tell who [facility] has [facility]? Would refer to urology for another opinion but will need scan results for any other visit

## 2022-09-16 NOTE — Telephone Encounter (Signed)
Spoke with patient's wife and informed her of response from Dr. Alain Honey. She stated that she will try to get scan report.

## 2022-09-26 ENCOUNTER — Other Ambulatory Visit: Payer: Self-pay | Admitting: Family

## 2022-09-26 DIAGNOSIS — I10 Essential (primary) hypertension: Secondary | ICD-10-CM

## 2022-09-29 ENCOUNTER — Ambulatory Visit (INDEPENDENT_AMBULATORY_CARE_PROVIDER_SITE_OTHER): Payer: PPO | Admitting: Family

## 2022-09-29 ENCOUNTER — Encounter: Payer: Self-pay | Admitting: Family

## 2022-09-29 VITALS — BP 138/88 | HR 58 | Temp 98.0°F | Resp 18 | Ht 72.0 in | Wt 261.6 lb

## 2022-09-29 DIAGNOSIS — L89320 Pressure ulcer of left buttock, unstageable: Secondary | ICD-10-CM

## 2022-09-29 MED ORDER — SANTYL 250 UNIT/GM EX OINT: 1.0000 | TOPICAL_OINTMENT | Freq: Every day | CUTANEOUS | 0 refills | Status: DC

## 2022-09-29 NOTE — Progress Notes (Signed)
Provider: Zakiah Beckerman FNP-C  Wardell Honour, MD  Patient Care Team: Wardell Honour, MD as PCP - General [facility])  Extended Emergency Contact Information Primary Emergency Contact: [facility] Address: [address], [place] [address] [name] of [place] Phone: [phone] Mobile Phone: [phone] Relation: Spouse Secondary Emergency Contact: [name] Address: [address]          [place], [place] [address] [name] of [employer] Phone: [phone] Mobile Phone: [phone] Relation: Daughter  Code Status:  Full Code  Goals of care: Advanced Directive information    07/30/2022    3:31 PM  Advanced Directives  Does Patient Have a Medical Advance Directive? Yes  Type of Paramedic of [place];Living will;Out of facility DNR (pink MOST or yellow form)  Does patient want to make changes to medical advance directive? No - Patient declined  Copy of [place] in Chart? Yes - validated most recent copy scanned in chart (See row information)     Chief Complaint  Patient presents with   Hospitalization Follow-up    Needs referral to wound care and FL2 filled out but left it at home-she will bring back.  Has ulcers on bottom and also a headache.  Has questions about potassium    HPI:  Pt is a 87 y.o. male seen today for an acute visit for evaluation wounds on the bottom.states sit and sleeps on the recliner unable to sleep on the sides due to pain.wife has been cleaning wound with saline and covering with foam dressing for extra protection. Has used different kinds of cushions including doughnut cushion without any relief.   Has chronic headache.tylenol and Ibuprofen ineffective.states [facility].    Needed FL 2 Form completed but wife states did not bring form and has not found a place to transfer patient.Made  aware that F L 2 form are good for one month.she will find a place then notify provider to complete F L 2 form.    Past Medical History:  Diagnosis Date   Anxiety and depression    BPH (benign prostatic hyperplasia)    Coronary artery disease    Fecal incontinence    GERD (gastroesophageal reflux disease)    Glaucoma    Hard of hearing    Headache    History of pneumonia    Hyperlipidemia    Hypertension    Memory impairment    Migraine headache    Neck fracture    2-3/2-21 followed by DR Ashok Croon in [place]    Nocturia    Osteoarthritis    Primary prostate cancer with metastasis from prostate to other site    Restrictive lung disease    Sleep apnea    wears CPAP does not know setting    Thyroid disease    Hyperparathyroidism    Urinary frequency    Urinary incontinence    Vascular dementia    Wears glasses    Past Surgical History:  Procedure Laterality Date   CARDIAC CATHETERIZATION     CIRCUMCISION     COLONOSCOPY     CORONARY ANGIOPLASTY     stents- 08/2019    ESOPHAGOGASTRODUODENOSCOPY     EYE SURGERY Bilateral    cataract   HAND SURGERY Right    HIP ARTHROPLASTY Right    HIP ARTHROPLASTY Left 2006   KNEE CARTILAGE SURGERY Bilateral  LUMBAR LAMINECTOMY     L3 - L4   RETINAL DETACHMENT SURGERY Right    x2   REVERSE SHOULDER ARTHROPLASTY Right 05/29/2016   Procedure: REVERSE SHOULDER ARTHROPLASTY;  Surgeon: Justice Britain, MD;  Location: [facility];  Service: Orthopedics;  Laterality: Right;   SHOULDER SURGERY Bilateral    TOTAL HIP REVISION Right 04/04/2020   Procedure: Right hip acetabular liner revision;  Surgeon: Gaynelle Arabian, MD;  Location: [facility];  Service: Orthopedics;  Laterality: Right;  35min    No Known Allergies  Outpatient Encounter Medications as of 09/29/2022  Medication Sig   acetaminophen (TYLENOL) 500 MG tablet Take 1,000 mg by mouth 2 (two) times daily.   amLODipine (NORVASC) 10 MG tablet Take 1 tablet (10 mg total) by mouth daily.  Needs an appointment before anymore future refills.   aspirin 81 MG tablet Take 81 mg by mouth at bedtime.   carvedilol (COREG) 25 MG tablet TAKE 1 TABLET BY MOUTH TWICE DAILY WITH  A  MEAL.   Cholecalciferol (VITAMIN D) 2000 units CAPS Take 2,000 Units by mouth every evening.   colestipol (COLESTID) 1 g tablet Take 1 tablet (1 g total) by mouth 2 (two) times daily.   Fluticasone Furoate 200 MCG/ACT AEPB Inhale 1 puff into the lungs daily at 12 noon.   latanoprost (XALATAN) 0.005 % ophthalmic solution Place 1 drop into both eyes at bedtime.   LIDOCAINE EX Place 1 patch onto the skin daily as needed (back pain).   magnesium oxide (MAG-OX) 400 MG tablet Take 1 tablet (400 mg total) by mouth every morning.   melatonin 5 MG TABS Take 1 tablet (5 mg total) by mouth at bedtime.   omeprazole (PRILOSEC) 20 MG capsule TAKE 1 CAPSULE BY MOUTH TWICE DAILY BEFORE A MEAL   QUEtiapine (SEROQUEL) 25 MG tablet TAKE 1 TABLET BY MOUTH AT BEDTIME   tamsulosin (FLOMAX) 0.4 MG CAPS capsule TAKE 1 CAPSULE BY MOUTH ONCE DAILY WITH SUPPER   timolol (TIMOPTIC) 0.5 % ophthalmic solution Place 1 drop into both eyes 2 (two) times daily.   vitamin B-12 (CYANOCOBALAMIN) 1000 MCG tablet Take 1,000 mcg by mouth every morning.   furosemide (LASIX) 20 MG tablet Take 1 tablet (20 mg total) by mouth in the morning for 2 days.   Respiratory Therapy Supplies (NEBULIZER/TUBING/MOUTHPIECE) KIT Use with Nebulizer medication every 6 hours as needed Dx:J98.4 (Patient not taking: Reported on 09/29/2022)   No facility-administered encounter medications on file as of 09/29/2022.    Review of Systems  Constitutional:  Negative for appetite change, chills, fatigue, fever and unexpected weight change.  Respiratory:  Negative for cough, chest tightness, shortness of breath and wheezing.   Cardiovascular:  Negative for chest pain, palpitations and leg swelling.  Gastrointestinal:  Negative for abdominal distention, abdominal pain, constipation,  diarrhea, nausea and vomiting.  Musculoskeletal:  Positive for arthralgias and gait problem. Negative for back pain, joint swelling, myalgias, neck pain and neck stiffness.  Skin:  Positive for wound. Negative for color change, pallor and rash.       Left buttock ulcer   Neurological:  Negative for dizziness, weakness, light-headedness, numbness and headaches.  Psychiatric/Behavioral:  Negative for agitation, behavioral problems, confusion, hallucinations and sleep disturbance. The patient is not nervous/anxious.     Immunization History  Administered Date(s) Administered   Influenza Split 04/30/2002, 04/12/2003, 04/05/2004, 04/30/2005, 04/21/2006, 05/03/2007, 03/30/2008, 03/30/2009, 03/27/2010, 05/07/2011, 03/30/2012, 02/28/2013, 05/31/2014, 04/13/2016, 05/13/2016, 04/24/2017, 04/19/2018   Influenza, High Dose Seasonal PF 03/05/2015, 03/05/2015, 05/13/2016, 04/24/2017,  04/27/2018, 03/23/2019   Influenza, Seasonal, Injecte, Preservative Fre 04/19/2018   Influenza-Unspecified 04/30/2002, 04/12/2003, 04/05/2004, 04/30/2005, 04/21/2006, 05/03/2007, 03/30/2008, 03/30/2009, 03/27/2010, 05/07/2011, 03/30/2012, 02/28/2013, 05/31/2014, 04/13/2016, 05/13/2016, 04/24/2017, 04/19/2018, 03/31/2019, 04/19/2020   Moderna Sars-Covid-2 Vaccination 07/13/2019, 08/10/2019, 04/27/2020, 02/11/2021   Pneumococcal Conjugate-13 04/12/2003, 10/18/2010, 01/22/2012, 12/22/2013   Pneumococcal Polysaccharide-23 11/22/2004   Pneumococcal-Unspecified 04/12/2003, 01/22/2012   Td 02/28/2004   Td (Adult), 2 Lf Tetanus Toxid, Preservative Free 02/28/2004   Td (Adult),unspecified 02/28/2004   Tdap 02/28/2004, 08/31/2014, 09/08/2016, 06/21/2020   Zoster Recombinat (Shingrix) 12/27/2018, 05/11/2019   Zoster, Live 07/30/2011   Pertinent  Health Maintenance Due  Topic Date Due   INFLUENZA VACCINE  01/29/2023      01/06/2022    8:00 PM 01/07/2022   10:00 AM 01/23/2022   10:36 AM 05/27/2022    2:42 PM 07/30/2022    3:31 PM   Fall Risk  Falls in the past year?   1 0 0  Was there an injury with Fall?   1 0 0  Fall Risk Category Calculator   3 0 0  Fall Risk Category (Retired)   High Low   (RETIRED) Patient Fall Risk Level High fall risk High fall risk Moderate fall risk Low fall risk   Patient at Risk for Falls Due to   History of fall(s) No Fall Risks No Fall Risks  Fall risk Follow up    Falls evaluation completed Falls evaluation completed   Functional Status Survey:    Vitals:   09/29/22 1121  BP: 138/88  Pulse: (!) 58  Resp: 18  Temp: 98 F (36.7 C)  TempSrc: Temporal  SpO2: 94%  Weight: 261 lb 9.6 oz (118.7 kg)  Height: 6' (1.829 m)   Body mass index is 35.48 kg/m. Physical Exam Vitals reviewed.  Constitutional:      General: He is not in acute distress.    Appearance: Normal appearance. He is obese. He is not ill-appearing or diaphoretic.  HENT:     Head: Normocephalic.  Eyes:     General: No scleral icterus.       Right eye: No discharge.        Left eye: No discharge.     Conjunctiva/sclera: Conjunctivae normal.     Pupils: Pupils are equal, round, and reactive to light.  Cardiovascular:     Rate and Rhythm: Normal rate and regular rhythm.     Pulses: Normal pulses.     Heart sounds: Normal heart sounds. No murmur heard.    No friction rub. No gallop.  Pulmonary:     Effort: Pulmonary effort is normal. No respiratory distress.     Breath sounds: Normal breath sounds. No wheezing, rhonchi or rales.  Chest:     Chest wall: No tenderness.  Abdominal:     General: Bowel sounds are normal. There is no distension.     Palpations: Abdomen is soft. There is no mass.     Tenderness: There is no abdominal tenderness. There is no guarding or rebound.  Musculoskeletal:        General: No swelling or tenderness. Normal range of motion.     Right lower leg: No edema.     Left lower leg: No edema.     Comments: Ambulates with a Rolator   Skin:    General: Skin is warm and dry.      Coloration: Skin is not pale.     Findings: No bruising, erythema, lesion or rash.     Comments:  Left gluteal approximately 4 cm wide ulcer with mid wound bed with dark colored eschar surrounded by pale-yellowish slough covering 90% of the wound surrounding area without any erythema. No tenderness to palpation or redness.  Right gluteal area linear erythema area.  Wound cleansed with saline,pat dry,covered with foam dressing for absorption and extra protection     Neurological:     Mental Status: He is alert. Mental status is at baseline.     Cranial Nerves: No cranial nerve deficit.     Sensory: No sensory deficit.     Motor: No weakness.     Coordination: Coordination normal.     Gait: Gait abnormal.  Psychiatric:        Mood and Affect: Mood normal.        Speech: Speech normal.        Behavior: Behavior normal.     Labs reviewed: Recent Labs    12/31/21 0235 01/01/22 0212 01/03/22 0146 01/04/22 0144 07/03/22 1153 07/31/22 1334  NA 142   < > 139 142 143 141  K 3.6   < > 3.7 4.0 3.7 4.4  CL 109   < > 109 112* 107 105  CO2 23   < > 20* 20* 25 27  GLUCOSE 92   < > 98 109* 103 67  BUN 17   < > 24* 23 17 15   CREATININE 1.21   < > 1.46* 1.17 1.14 1.16  CALCIUM 9.2   < > 8.7* 9.0 9.2 9.4  MG 1.8  --  1.9  --   --   --    < > = values in this interval not displayed.   Recent Labs    12/30/21 1316 12/31/21 0235 01/02/22 0233 07/03/22 1153 07/31/22 1334  AST 30 28 37 13 24  ALT 48* 44 59* 12 22  ALKPHOS 58 65 70  --   --   BILITOT 1.0 1.2 1.4* 0.8 0.7  PROT 7.1 6.2* 6.1* 6.5 6.3  ALBUMIN 4.2 3.3* 3.2*  --   --    Recent Labs    12/31/21 0235 01/01/22 0212 01/03/22 0146 07/03/22 1153 07/31/22 1334  WBC 7.1   < > 9.4 8.5 6.3  NEUTROABS 4.9  --   --  6,367 4,133  HGB 14.8   < > 15.0 15.0 14.8  HCT 43.9   < > 45.1 44.8 42.6  MCV 95.0   < > 95.1 90.9 89.5  PLT 151   < > 150 167 188   < > = values in this interval not displayed.   Lab Results  Component Value  Date   TSH 1.991 12/31/2021   Lab Results  Component Value Date   HGBA1C 5.3 03/11/2021   Lab Results  Component Value Date   CHOL 204 (H) 11/14/2021   HDL 30 (L) 11/14/2021   LDLCALC 136 (H) 11/14/2021   TRIG 250 (H) 11/14/2021   CHOLHDL 6.8 (H) 11/14/2021    Significant Diagnostic Results in last 30 days:  No results found.  Assessment/Plan  Unstageable pressure ulcer of left buttock Afebrile  Left gluteal approximately 4 cm wide ulcer with mid wound bed with dark colored eschar surrounded by pale-yellowish slough covering 90% of the wound surrounding area without any erythema. No tenderness to palpation or redness.  Right gluteal area linear erythema area.  Wound cleansed with saline,pat dry,covered with foam dressing for absorption and extra protection  - Has Hospice Nurse coming on Thursday 10/02/2022 advised to cleanse  wound with saline,pat dry,then apply santyl to eschar and yellow skin tissue then cover with foam dressing for absorption and extra  protection.   - collagenase (SANTYL) 250 UNIT/GM ointment; Apply 1 Application topically daily. Left buttock ulcer  Dispense: 15 g; Refill: 0 - advised to notify provider for any odor,erythema or drainage.   Family/ staff Communication: Reviewed plan of care with patient and wife verbalized understanding.  Labs/tests ordered: None   Next Appointment: Return if symptoms worsen or fail to improve.   Sandrea Hughs, NP

## 2022-09-29 NOTE — Patient Instructions (Addendum)
Hospice Nurse to cleanse right buttock ulcer with  with saline ,pat dry, santyl to dark eschar area and cover with foam dressing for extra protection and absorption.change dressing every 3 days and as needed if soiled.

## 2022-09-30 ENCOUNTER — Telehealth: Payer: Self-pay

## 2022-09-30 NOTE — Telephone Encounter (Signed)
Sonia Baller with [facility] called about santyl precription. She said in order for the insurance to pay for medication they need the measurements of the wound (length, width and depth) Also needs the frequency of use.   Message routed to Marlowe Sax, NP

## 2022-09-30 NOTE — Telephone Encounter (Signed)
Wound measured 4 cm X 4 cm santyl to be applied every 3 days.

## 2022-10-01 NOTE — Telephone Encounter (Signed)
Spoke with pharmacy and gave them the information regarding the wound.

## 2022-10-29 ENCOUNTER — Other Ambulatory Visit: Payer: Self-pay | Admitting: Adult Health

## 2022-10-29 DIAGNOSIS — I10 Essential (primary) hypertension: Secondary | ICD-10-CM

## 2022-11-26 ENCOUNTER — Other Ambulatory Visit: Payer: Self-pay | Admitting: Family Medicine

## 2022-12-02 ENCOUNTER — Other Ambulatory Visit: Payer: Self-pay | Admitting: Family

## 2022-12-02 DIAGNOSIS — I10 Essential (primary) hypertension: Secondary | ICD-10-CM

## 2022-12-29 ENCOUNTER — Telehealth: Payer: Self-pay

## 2022-12-29 NOTE — Telephone Encounter (Signed)
Mrs. Uttley called to let Dr. Hyacinth Meeker, Dinah Ngetich,NP and Amy Trecia Rogers know that Mr. Bonello passed away on yesterday, Jan 08, 2023 and she is so thankful for everything each one of you guys have done for him during his visits at [facility].  She will have the funeral home to send the death certificate to the office to be signed.

## 2022-12-29 DEATH — deceased
